# Patient Record
Sex: Female | Born: 1965 | Race: Black or African American | Hispanic: No | Marital: Single | Smoking: Current some day smoker
Health system: Southern US, Community
[De-identification: ages and names within clinical notes are randomized; demographics above are authoritative.]

## PROBLEM LIST (undated history)

## (undated) ENCOUNTER — Ambulatory Visit: Admission: EM | Payer: MEDICAID

## (undated) DIAGNOSIS — I219 Acute myocardial infarction, unspecified: Secondary | ICD-10-CM

## (undated) DIAGNOSIS — I2510 Atherosclerotic heart disease of native coronary artery without angina pectoris: Secondary | ICD-10-CM

## (undated) DIAGNOSIS — F319 Bipolar disorder, unspecified: Secondary | ICD-10-CM

## (undated) DIAGNOSIS — I639 Cerebral infarction, unspecified: Secondary | ICD-10-CM

## (undated) DIAGNOSIS — R569 Unspecified convulsions: Secondary | ICD-10-CM

## (undated) DIAGNOSIS — I10 Essential (primary) hypertension: Secondary | ICD-10-CM

## (undated) DIAGNOSIS — K5792 Diverticulitis of intestine, part unspecified, without perforation or abscess without bleeding: Secondary | ICD-10-CM

## (undated) DIAGNOSIS — J449 Chronic obstructive pulmonary disease, unspecified: Secondary | ICD-10-CM

## (undated) DIAGNOSIS — C801 Malignant (primary) neoplasm, unspecified: Secondary | ICD-10-CM

## (undated) HISTORY — PX: ABDOMINAL SURGERY: SHX537

## (undated) HISTORY — PX: BRAIN SURGERY: SHX531

## (undated) HISTORY — PX: CHOLECYSTECTOMY: SHX55

---

## 2019-09-10 DIAGNOSIS — Z955 Presence of coronary angioplasty implant and graft: Secondary | ICD-10-CM | POA: Insufficient documentation

## 2019-09-10 DIAGNOSIS — I10 Essential (primary) hypertension: Secondary | ICD-10-CM | POA: Insufficient documentation

## 2019-09-10 DIAGNOSIS — I252 Old myocardial infarction: Secondary | ICD-10-CM | POA: Insufficient documentation

## 2019-09-10 DIAGNOSIS — N183 Chronic kidney disease, stage 3 unspecified: Secondary | ICD-10-CM | POA: Insufficient documentation

## 2019-09-10 DIAGNOSIS — Z86718 Personal history of other venous thrombosis and embolism: Secondary | ICD-10-CM | POA: Insufficient documentation

## 2019-09-10 DIAGNOSIS — Z86711 Personal history of pulmonary embolism: Secondary | ICD-10-CM | POA: Insufficient documentation

## 2019-09-10 DIAGNOSIS — E785 Hyperlipidemia, unspecified: Secondary | ICD-10-CM | POA: Insufficient documentation

## 2020-04-04 ENCOUNTER — Emergency Department (HOSPITAL_COMMUNITY): Payer: Medicaid Other

## 2020-04-04 ENCOUNTER — Other Ambulatory Visit: Payer: Self-pay

## 2020-04-04 ENCOUNTER — Encounter (HOSPITAL_COMMUNITY): Payer: Self-pay | Admitting: Emergency Medicine

## 2020-04-04 ENCOUNTER — Inpatient Hospital Stay (HOSPITAL_COMMUNITY)
Admission: EM | Admit: 2020-04-04 | Discharge: 2020-04-05 | DRG: 311 | Discharge status: Against Medical Advice | Payer: Medicaid Other | Attending: Cardiovascular Disease | Admitting: Cardiovascular Disease

## 2020-04-04 DIAGNOSIS — I129 Hypertensive chronic kidney disease with stage 1 through stage 4 chronic kidney disease, or unspecified chronic kidney disease: Secondary | ICD-10-CM | POA: Diagnosis present

## 2020-04-04 DIAGNOSIS — Z5329 Procedure and treatment not carried out because of patient's decision for other reasons: Secondary | ICD-10-CM | POA: Diagnosis present

## 2020-04-04 DIAGNOSIS — I249 Acute ischemic heart disease, unspecified: Principal | ICD-10-CM | POA: Diagnosis present

## 2020-04-04 DIAGNOSIS — I340 Nonrheumatic mitral (valve) insufficiency: Secondary | ICD-10-CM | POA: Diagnosis present

## 2020-04-04 DIAGNOSIS — N1831 Chronic kidney disease, stage 3a: Secondary | ICD-10-CM | POA: Diagnosis present

## 2020-04-04 DIAGNOSIS — G40909 Epilepsy, unspecified, not intractable, without status epilepticus: Secondary | ICD-10-CM | POA: Diagnosis present

## 2020-04-04 DIAGNOSIS — Z20822 Contact with and (suspected) exposure to covid-19: Secondary | ICD-10-CM | POA: Diagnosis present

## 2020-04-04 DIAGNOSIS — R079 Chest pain, unspecified: Secondary | ICD-10-CM

## 2020-04-04 DIAGNOSIS — I200 Unstable angina: Secondary | ICD-10-CM

## 2020-04-04 DIAGNOSIS — J449 Chronic obstructive pulmonary disease, unspecified: Secondary | ICD-10-CM | POA: Diagnosis present

## 2020-04-04 DIAGNOSIS — I252 Old myocardial infarction: Secondary | ICD-10-CM | POA: Diagnosis not present

## 2020-04-04 DIAGNOSIS — Z8673 Personal history of transient ischemic attack (TIA), and cerebral infarction without residual deficits: Secondary | ICD-10-CM

## 2020-04-04 DIAGNOSIS — Z9111 Patient's noncompliance with dietary regimen: Secondary | ICD-10-CM | POA: Diagnosis not present

## 2020-04-04 DIAGNOSIS — Z955 Presence of coronary angioplasty implant and graft: Secondary | ICD-10-CM | POA: Diagnosis not present

## 2020-04-04 DIAGNOSIS — Z6841 Body Mass Index (BMI) 40.0 and over, adult: Secondary | ICD-10-CM | POA: Diagnosis not present

## 2020-04-04 HISTORY — DX: Cerebral infarction, unspecified: I63.9

## 2020-04-04 HISTORY — DX: Acute myocardial infarction, unspecified: I21.9

## 2020-04-04 HISTORY — DX: Diverticulitis of intestine, part unspecified, without perforation or abscess without bleeding: K57.92

## 2020-04-04 HISTORY — DX: Unspecified convulsions: R56.9

## 2020-04-04 HISTORY — DX: Chronic obstructive pulmonary disease, unspecified: J44.9

## 2020-04-04 HISTORY — DX: Essential (primary) hypertension: I10

## 2020-04-04 LAB — CBC
HCT: 40.8 % (ref 36.0–46.0)
Hemoglobin: 13.5 g/dL (ref 12.0–15.0)
MCH: 28.7 pg (ref 26.0–34.0)
MCHC: 33.1 g/dL (ref 30.0–36.0)
MCV: 86.6 fL (ref 80.0–100.0)
Platelets: 266 10*3/uL (ref 150–400)
RBC: 4.71 MIL/uL (ref 3.87–5.11)
RDW: 15.4 % (ref 11.5–15.5)
WBC: 10.6 10*3/uL — ABNORMAL HIGH (ref 4.0–10.5)
nRBC: 0 % (ref 0.0–0.2)

## 2020-04-04 LAB — BASIC METABOLIC PANEL
Anion gap: 12 (ref 5–15)
BUN: 13 mg/dL (ref 6–20)
CO2: 19 mmol/L — ABNORMAL LOW (ref 22–32)
Calcium: 9.1 mg/dL (ref 8.9–10.3)
Chloride: 109 mmol/L (ref 98–111)
Creatinine, Ser: 1.83 mg/dL — ABNORMAL HIGH (ref 0.44–1.00)
GFR, Estimated: 32 mL/min — ABNORMAL LOW (ref >60)
Glucose, Bld: 90 mg/dL (ref 70–99)
Potassium: 4.1 mmol/L (ref 3.5–5.1)
Sodium: 140 mmol/L (ref 135–145)

## 2020-04-04 LAB — TROPONIN I (HIGH SENSITIVITY): Troponin I (High Sensitivity): 21 ng/L — ABNORMAL HIGH (ref <18)

## 2020-04-04 IMAGING — DX DG CHEST 2V
2 series · 2 of 2 positions shown · non-contrast
Comparison: None.

CLINICAL DATA: Left-sided chest pain for 4 days, nausea, short of
breath

EXAM:
CHEST - 2 VIEW

[chest pa]
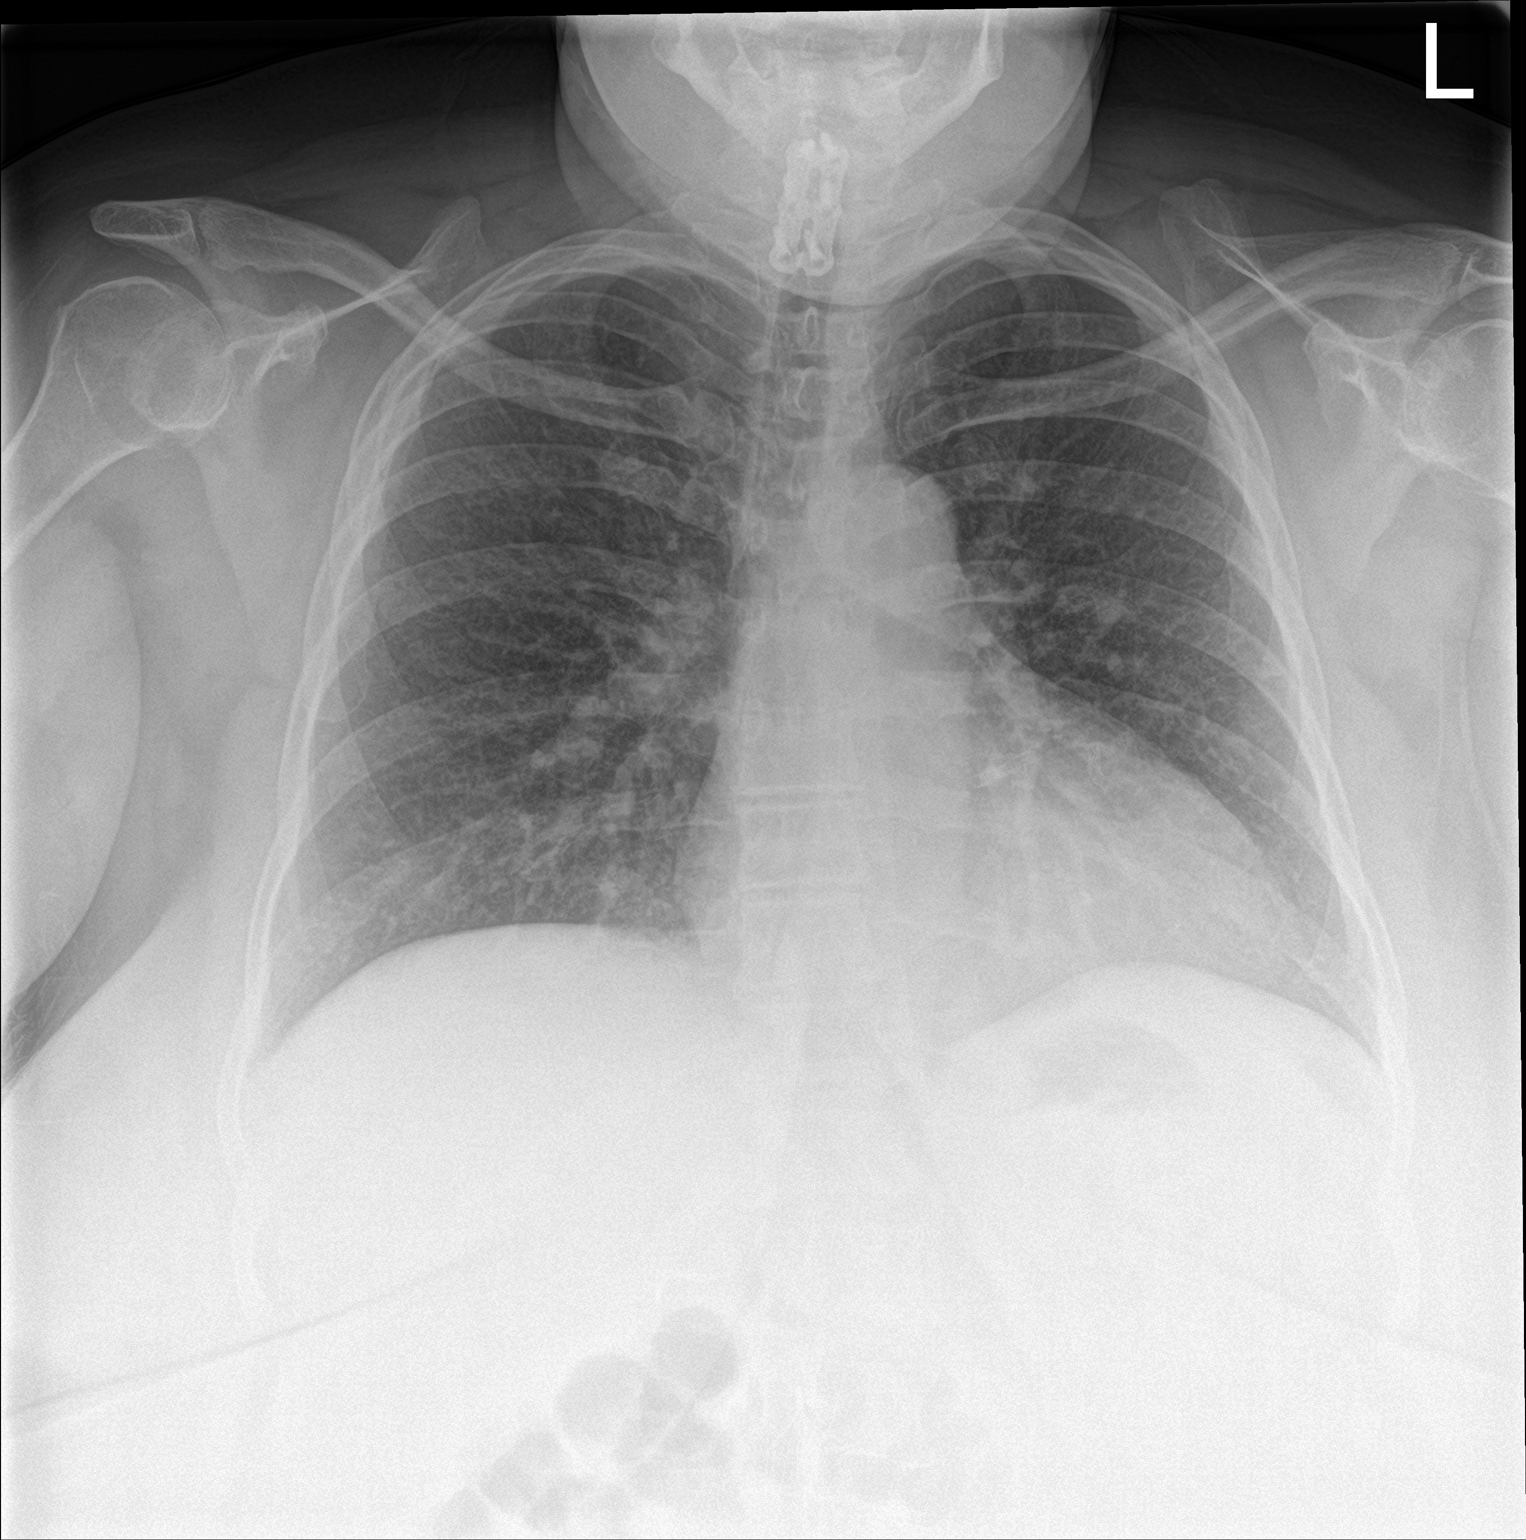

[chest lat]
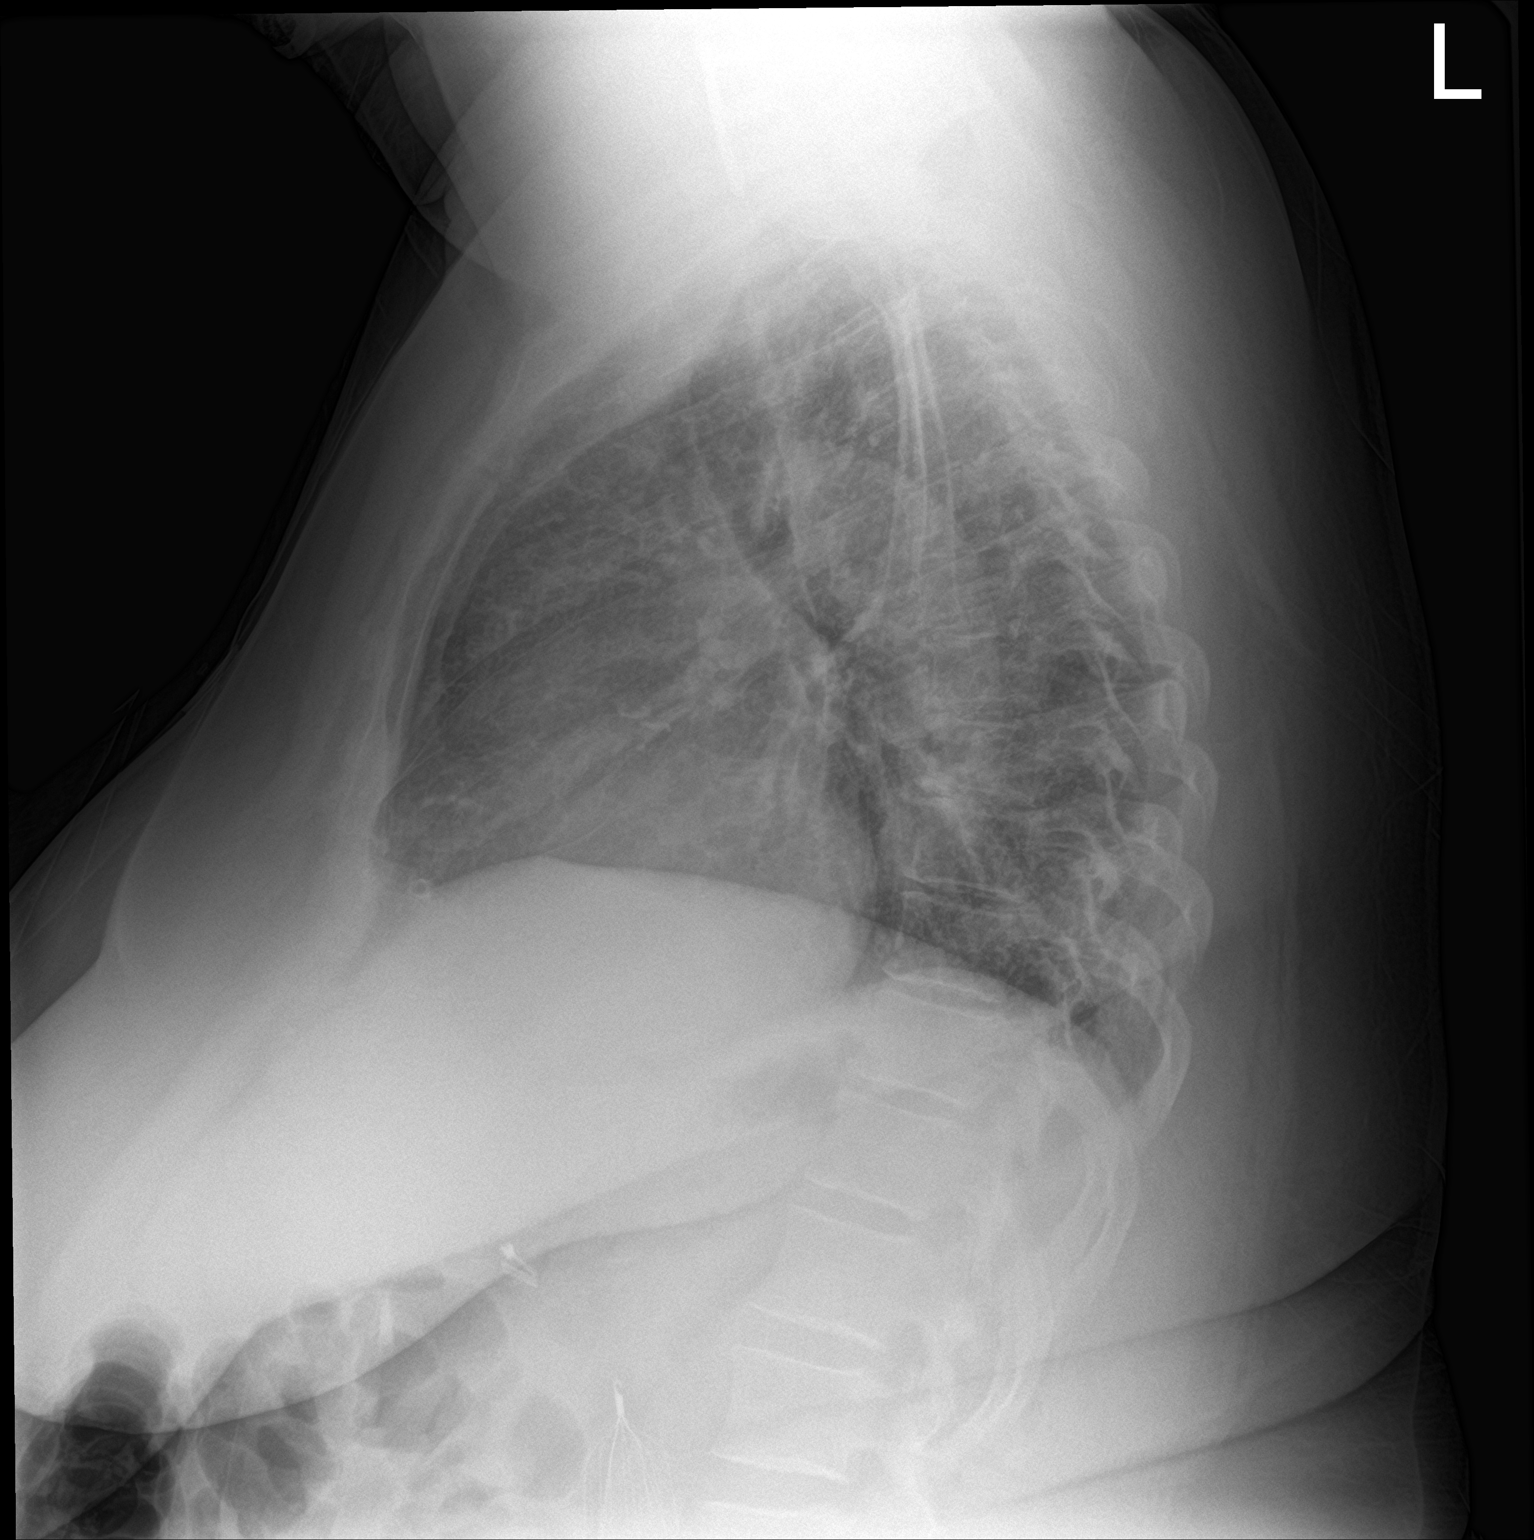

[2 of 2 positions shown; findings below may reference images not displayed]

FINDINGS: Frontal and lateral views of the chest demonstrate an unremarkable
cardiac silhouette. No acute airspace disease, effusion, or
pneumothorax. No acute bony abnormalities. IVC filter seen within
the abdomen.
IMPRESSION: 1. No acute intrathoracic process.

## 2020-04-04 MED ORDER — SODIUM CHLORIDE 0.9 % IV SOLN: INTRAVENOUS | Status: DC

## 2020-04-04 MED ORDER — ATORVASTATIN CALCIUM 80 MG PO TABS
80.0000 mg | ORAL_TABLET | Freq: Every day | ORAL | Status: DC
  Administered 2020-04-04 – 2020-04-05 (×2): 80 mg via ORAL
  Filled 2020-04-04: qty 1
  Filled 2020-04-04: qty 2

## 2020-04-04 MED ORDER — ONDANSETRON HCL 4 MG/2ML IJ SOLN: 4.0000 mg | Freq: Four times a day (QID) | INTRAMUSCULAR | Status: DC | PRN

## 2020-04-04 MED ORDER — HEPARIN (PORCINE) 25000 UT/250ML-% IV SOLN
1700.0000 [IU]/h | INTRAVENOUS | Status: DC
  Administered 2020-04-04: 20:00:00 1100 [IU]/h via INTRAVENOUS
  Filled 2020-04-04 (×4): qty 250

## 2020-04-04 MED ORDER — MORPHINE SULFATE (PF) 2 MG/ML IV SOLN
2.0000 mg | Freq: Once | INTRAVENOUS | Status: AC
  Administered 2020-04-04: 19:00:00 2 mg via INTRAVENOUS
  Filled 2020-04-04: qty 1

## 2020-04-04 MED ORDER — ACETAMINOPHEN 325 MG PO TABS
650.0000 mg | ORAL_TABLET | ORAL | Status: DC | PRN
  Administered 2020-04-04: 650 mg via ORAL
  Filled 2020-04-04: qty 2

## 2020-04-04 MED ORDER — HEPARIN BOLUS VIA INFUSION
4000.0000 [IU] | Freq: Once | INTRAVENOUS | Status: AC
  Administered 2020-04-04: 20:00:00 4000 [IU] via INTRAVENOUS
  Filled 2020-04-04: qty 4000

## 2020-04-04 MED ORDER — METOPROLOL TARTRATE 12.5 MG HALF TABLET
12.5000 mg | ORAL_TABLET | Freq: Two times a day (BID) | ORAL | Status: DC
  Administered 2020-04-04 – 2020-04-05 (×2): 12.5 mg via ORAL
  Filled 2020-04-04 (×2): qty 1

## 2020-04-04 MED ORDER — ASPIRIN EC 81 MG PO TBEC
81.0000 mg | DELAYED_RELEASE_TABLET | Freq: Every day | ORAL | Status: DC
  Administered 2020-04-05: 13:00:00 81 mg via ORAL
  Filled 2020-04-04: qty 1

## 2020-04-04 MED ORDER — NITROGLYCERIN 0.4 MG SL SUBL
0.4000 mg | SUBLINGUAL_TABLET | SUBLINGUAL | Status: DC | PRN
  Administered 2020-04-04: 20:00:00 0.4 mg via SUBLINGUAL
  Filled 2020-04-04: qty 1

## 2020-04-04 NOTE — H&P (Addendum)
Referring Physician: Belaya/Tegler  Colleen Curtis is an 54 y.o. female.                       Chief Complaint: Chest pain  HPI: 54 years old black female with prior h/o of MI and multiple stent placement in [place] has left sided under the breast chest pain radiating to left arm. Chest pain responded to SL NTG. She has EKG with sinus rhythm, low voltage and without ST elevation. Her chest x-ray is negative for acute problem. Labs are pending.  She denies fever or significant cough. She has PMH of HTN, COPD, MI, s/p stents and stroke.   Past Medical History:  Diagnosis Date   COPD (chronic obstructive pulmonary disease) (HCC)    Diverticulitis    Hypertension    Myocardial infarction (HCC)    Seizure (HCC)    Stroke [facility])       Past Surgical History:  Procedure Laterality Date   CHOLECYSTECTOMY      No family history on file. Social History:  has no history on file for tobacco use, alcohol use, and drug use.  Allergies: No Known Allergies  (Not in a hospital admission)   No results found for this or any previous visit (from the past 48 hour(s)). DG Chest 2 View  Result Date: 04/04/2020 CLINICAL DATA:  Left-sided chest pain for 4 days, nausea, short of breath EXAM: CHEST - 2 VIEW COMPARISON:  None. FINDINGS: Frontal and lateral views of the chest demonstrate an unremarkable cardiac silhouette. No acute airspace disease, effusion, or pneumothorax. No acute bony abnormalities. IVC filter seen within the abdomen. IMPRESSION: 1. No acute intrathoracic process. Electronically Signed   By: Sharlet Salina M.D.   On: 04/04/2020 18:04    Review Of Systems Constitutional: No fever, chills, chronic weight gain. Eyes: No vision change, wears glasses. No discharge or pain. Ears: No hearing loss, No tinnitus. Respiratory: No asthma, positive COPD, pneumonias. Positive shortness of breath. No hemoptysis. Cardiovascular: Positive chest pain, palpitation, leg  edema. Gastrointestinal: No nausea, vomiting, diarrhea, constipation. No GI bleed. No hepatitis. Genitourinary: No dysuria, hematuria, kidney stone. No incontinance. Neurological: No headache, positive stroke, no seizures.  Psychiatry: No psych facility admission for anxiety, depression, suicide. No detox. Skin: No rash. Musculoskeletal: Positive joint pain, fibromyalgia, neck pain, back pain. Lymphadenopathy: No lymphadenopathy. Hematology: Positive anemia or easy bruising.   Blood pressure 140/89, pulse 75, temperature 98.5 F (36.9 C), temperature source Oral, resp. rate 20, height 5\' 3"  (1.6 m), weight 127 kg, SpO2 100 %. Body mass index is 49.6 kg/m. General appearance: alert, cooperative, appears stated age and no distress Head: Normocephalic, atraumatic. Eyes: Brown eyes, pink conjunctiva, corneas clear.  Neck: No adenopathy, no carotid bruit, no JVD, supple, symmetrical, trachea midline and thyroid not enlarged. Resp: Clear to auscultation bilaterally. Cardio: Regular rate and rhythm, S1, S2 normal, II/VI systolic murmur, no click, rub or gallop GI: Soft, non-tender; bowel sounds normal; no organomegaly. Extremities: No edema, cyanosis or clubbing. Skin: Warm and dry.  Neurologic: Alert and oriented X 3, normal strength. Normal coordination.  Assessment/Plan Acute coronary syndrome S/P coronary stents HTN COPD Morbid obesity  Admit Check Troponin I, IV heparin  Cardiac cath v/s nuclear stress test in AM  Time spent: Review of old records, Lab, x-rays, EKG, other cardiac tests, examination, discussion with patient/Nurse/Physician over 70 minutes.  , MD  04/04/2020, 6:43 PM

## 2020-04-04 NOTE — Progress Notes (Signed)
ANTICOAGULATION CONSULT NOTE  Pharmacy Consult for Heparin Indication: chest pain/ACS  No Known Allergies  Patient Measurements: Height: 5\' 3"  (160 cm) Weight: 127 kg (280 lb) IBW/kg (Calculated) : 52.4 Heparin Dosing Weight: 84 kg  Vital Signs: Temp: 98.5 F (36.9 C) (12/28 1723) Temp Source: Oral (12/28 1723) BP: 140/89 (12/28 1723) Pulse Rate: 75 (12/28 1723)  Labs: No results for input(s): HGB, HCT, PLT, APTT, LABPROT, INR, HEPARINUNFRC, HEPRLOWMOCWT, CREATININE, CKTOTAL, CKMB, TROPONINIHS in the last 72 hours.  CrCl cannot be calculated (No successful lab value found.).   Medical History: Past Medical History:  Diagnosis Date  . COPD (chronic obstructive pulmonary disease) (HCC)   . Diverticulitis   . Hypertension   . Myocardial infarction (HCC)   . Seizure (HCC)   . Stroke [facility])     Medications:  Scheduled:  . [START ON 04/05/2020] aspirin EC  81 mg Oral Daily  . atorvastatin  80 mg Oral Daily  . heparin  4,000 Units Intravenous Once  . metoprolol tartrate  12.5 mg Oral BID  .  morphine injection  2 mg Intravenous Once    Assessment: Patient is a 61 yof that is being admitted for an ACS workup. The patient has a hx of Mis in the past with stent. The patient presents today with chest pain and cardiology has asked pharmacy to dose heparin at this time. Trops still pending. Patient not taking her xarelto 2/2 to insurance not covering.   Goal of Therapy:  Heparin level 0.3-0.7 units/ml Monitor platelets by anticoagulation protocol: Yes   Plan:  - Heparin bolus 4000 units IV x 1 dose - Heparin drip @ 1100 units/hr - Heparin level in ~ 6 hours  - Monitor patient for s/s of bleeding and CBC while on heparin   59 PharmD. BCPS  04/04/2020,6:55 PM

## 2020-04-04 NOTE — ED Notes (Signed)
Pt given a bag lunch and gingerale. 

## 2020-04-04 NOTE — ED Triage Notes (Signed)
Pt with L side pain with radiation to L chest x 4 days. Endorses nausea and shortness of breath. Took a nitro at 1200 today with some improvement in pain. Hx MI with stent placement.

## 2020-04-04 NOTE — ED Provider Notes (Addendum)
[facility] EMERGENCY DEPARTMENT Provider Note   CSN: [id number] Arrival date & time: 04/04/20  1716     History No chief complaint on file.   Colleen Curtis is a 54 y.o. female.  HPI 54 year old female with history of COPD, hypertension, MI, seizures, stroke [place] to the ER with 4 days of left-sided chest pain that radiates into her left arm.  Patient states she had an MI 2018 with 5 stent placements.  States that she has tried several blood thinners, the only blood thinner that she has not had a reaction to Xarelto, however she is not taking it as her insurance will not cover it.  She noticed left-sided chest pain proximally 4 days ago which has progressively gotten worse.  Pain then progressed to the left arm proximally a day ago.  She states that when she had her previous MI, she just had left arm pain, no chest pain.  She is also having worsening shortness of breath with exertion, which is more than her baseline with her COPD.  She also endorses intermittent nausea.  She took some nitro at around 12:00 today with some mild relief.  States that she just recently moved to the [place] area from [place] and does not have a cardiologist.  Continues to be symptomatic.  She is having symptoms at rest.  No fevers or chills.  No cough or leg swelling.  No back pain.  No diaphoresis.    Past Medical History:  Diagnosis Date  . COPD (chronic obstructive pulmonary disease) ([place])   . Diverticulitis   . Hypertension   . Myocardial infarction ([place])   . Seizure ([place])   . Stroke [facility])     Patient Active Problem List   Diagnosis Date Noted  . Acute coronary syndrome ([place]) 04/04/2020    Past Surgical History:  Procedure Laterality Date  . ABDOMINAL SURGERY    . CHOLECYSTECTOMY       OB History   No obstetric history on file.     History reviewed. No pertinent family history.  Social History   Tobacco Use  . Smoking status: Current Some Day Smoker     Types: Cigars  . Smokeless tobacco: Never Used  Vaping Use  . Vaping Use: Never used  Substance Use Topics  . Alcohol use: Never  . Drug use: Never    Comment: " medical Marijuana "    Home Medications Prior to Admission medications   Medication Sig Start Date End Date Taking? Authorizing Provider  albuterol (VENTOLIN HFA) 108 (90 Base) MCG/ACT inhaler Inhale 1 puff into the lungs daily as needed for wheezing or shortness of breath.   Yes [provider]  allopurinol (ZYLOPRIM) 100 MG tablet Take 100 mg by mouth daily.   Yes [provider]  atorvastatin (LIPITOR) 40 MG tablet Take 80 mg by mouth at bedtime. 09/17/19  Yes [provider]  carvedilol (COREG) 12.5 MG tablet Take by mouth 2 (two) times daily with a meal.   Yes [provider]  isosorbide mononitrate (IMDUR) 60 MG 24 hr tablet Take 60 mg by mouth daily.   Yes [provider]  nitroGLYCERIN (NITROSTAT) 0.4 MG SL tablet Place 0.4 mg under the tongue every 5 (five) minutes as needed for chest pain.   Yes [provider]  ranolazine (RANEXA) 1000 MG SR tablet Take by mouth in the morning and at bedtime. 09/17/19  Yes [provider]  zolpidem (AMBIEN) 10 MG  tablet Take 10 mg by mouth at bedtime.   Yes [provider]    Allergies    Patient has no known allergies.  Review of Systems   Review of Systems  Constitutional: Negative for chills and fever.  HENT: Negative for ear pain and sore throat.   Eyes: Negative for pain and visual disturbance.  Respiratory: Positive for chest tightness and shortness of breath. Negative for cough and wheezing.   Cardiovascular: Positive for chest pain. Negative for palpitations and leg swelling.  Gastrointestinal: Negative for abdominal pain and vomiting.  Genitourinary: Negative for dysuria and hematuria.  Musculoskeletal: Negative for arthralgias and back pain.  Skin: Negative for color change and rash.  Neurological:  Negative for seizures and syncope.  All other systems reviewed and are negative.   Physical Exam Updated Vital Signs BP (!) 151/90 (BP Location: Right Arm)   Pulse 60   Temp 98.1 F (36.7 C) (Oral)   Resp 20   Ht 5\' 3"  (1.6 m)   Wt 127.7 kg   SpO2 100%   BMI 49.87 kg/m   Physical Exam Vitals and nursing note reviewed.  Constitutional:      General: She is not in acute distress.    Appearance: She is well-developed and well-nourished. She is obese. She is not ill-appearing or diaphoretic.  HENT:     Head: Normocephalic and atraumatic.  Eyes:     Conjunctiva/sclera: Conjunctivae normal.  Cardiovascular:     Rate and Rhythm: Normal rate and regular rhythm.     Pulses: Normal pulses.     Heart sounds: No murmur heard.   Pulmonary:     Effort: No respiratory distress.     Breath sounds: Normal breath sounds.     Comments: Mildly tachypneic with speaking Abdominal:     General: Abdomen is flat.     Palpations: Abdomen is soft.     Tenderness: There is no abdominal tenderness.  Musculoskeletal:        General: No edema. Normal range of motion.     Cervical back: Neck supple.     Right lower leg: No edema.     Left lower leg: No edema.  Skin:    General: Skin is warm and dry.     Capillary Refill: Capillary refill takes less than 2 seconds.     Findings: No erythema or rash.  Neurological:     General: No focal deficit present.     Mental Status: She is alert and oriented to person, place, and time.  Psychiatric:        Mood and Affect: Mood and affect normal.     ED Results / Procedures / Treatments   Labs (all labs ordered are listed, but only abnormal results are displayed) Labs Reviewed  BASIC METABOLIC PANEL - Abnormal; Notable for the following components:      Result Value   CO2 19 (*)    Creatinine, Ser 1.83 (*)    GFR, Estimated 32 (*)    All other components within normal limits  CBC - Abnormal; Notable for the following components:   WBC 10.6 (*)     All other components within normal limits  BASIC METABOLIC PANEL - Abnormal; Notable for the following components:   CO2 20 (*)    Glucose, Bld 100 (*)    Creatinine, Ser 1.88 (*)    Calcium 8.6 (*)    GFR, Estimated 31 (*)    All other components within normal limits  LIPID  PANEL - Abnormal; Notable for the following components:   Cholesterol 218 (*)    Triglycerides 198 (*)    HDL 40 (*)    LDL Cholesterol 138 (*)    All other components within normal limits  HEPARIN LEVEL (UNFRACTIONATED) - Abnormal; Notable for the following components:   Heparin Unfractionated 0.13 (*)    All other components within normal limits  HEPARIN LEVEL (UNFRACTIONATED) - Abnormal; Notable for the following components:   Heparin Unfractionated <0.10 (*)    All other components within normal limits  TROPONIN I (HIGH SENSITIVITY) - Abnormal; Notable for the following components:   Troponin I (High Sensitivity) 21 (*)    All other components within normal limits  TROPONIN I (HIGH SENSITIVITY) - Abnormal; Notable for the following components:   Troponin I (High Sensitivity) 26 (*)    All other components within normal limits  SARS CORONAVIRUS 2 (TAT 6-24 HRS)  HIV ANTIBODY (ROUTINE TESTING W REFLEX)  PROTIME-INR  CBC    EKG EKG Interpretation  Date/Time:  Wednesday April 05 2020 08:03:38 EST Ventricular Rate:  59 PR Interval:  182 QRS Duration: 101 QT Interval:  451 QTC Calculation: 447 R Axis:   75 Text Interpretation: Sinus rhythm Low voltage, precordial leads Confirmed by Kennis Carina [id number]) on 04/06/2020 12:52:17 PM   Radiology No results found.  Procedures .Critical Care Performed by: Mare Ferrari, PA-C Authorized by: Mare Ferrari, PA-C   Critical care provider statement:    Critical care time (minutes):  35   Critical care was time spent personally by me on the following activities:  Discussions with consultants, evaluation of patient's response to treatment,  examination of patient, ordering and performing treatments and interventions, ordering and review of laboratory studies, ordering and review of radiographic studies, pulse oximetry, re-evaluation of patient's condition, obtaining history from patient or surrogate and review of old charts   (including critical care time)  Medications Ordered in ED Medications  morphine 2 MG/ML injection 2 mg (2 mg Intravenous Given 04/04/20 1903)  heparin bolus via infusion 4,000 Units (4,000 Units Intravenous Bolus from Bag 04/04/20 1951)  heparin bolus via infusion 2,000 Units (2,000 Units Intravenous Bolus from Bag 04/05/20 0458)  regadenoson (LEXISCAN) injection SOLN 0.4 mg (0.4 mg Intravenous Given 04/05/20 0922)  technetium tetrofosmin (TC-MYOVIEW) injection 29 millicurie (29 millicuries Intravenous Contrast Given 04/05/20 0930)  heparin bolus via infusion 2,000 Units (2,000 Units Intravenous Bolus from Bag 04/05/20 1503)    ED Course  I have reviewed the triage vital signs and the nursing notes.  Pertinent labs & imaging results that were available during my care of the patient were reviewed by me and considered in my medical decision making (see chart for details).    MDM Rules/Calculators/A&P                          54 year old female with left-sided chest pain and arm pain x4 days Presentation, she is alert, oriented, nontoxic-appearing, mildly tachypneic on exam, however speaking in full sentences, nondiaphoretic.  Vitals on arrival overall reassuring, not tachycardic, hypoxic, normotensive.  Physical exam with no noted lower extremity edema, clear lung sounds, normal heart sounds.  Given concerning story with patient's prior cardiac history, along with patient needing to be anticoagulated but is not on anticoagulation, consulted cardiology Dr. Algie Coffer who will see and evaluate the patient.  Cardiology will admit, started on heparin.  Remains hemodynamically stable here in the ED.  This was  a  shared visit with my supervising physician Dr. Sherry Ruffing who independently saw and evaluated the patient & provided guidance in evaluation/management/disposition ,in agreement with care  Final Clinical Impression(s) / ED Diagnoses Final diagnoses:  Unstable angina [facility])    Rx / DC Orders ED Discharge Orders    None       Lyndel Safe 04/04/20 1922    Tegeler, Gwenyth Allegra, MD 04/04/20 2200    Garald Balding, PA-C 04/17/20 1516    Tegeler, Gwenyth Allegra, MD 04/17/20 2241

## 2020-04-05 ENCOUNTER — Inpatient Hospital Stay (HOSPITAL_COMMUNITY): Payer: Medicaid Other

## 2020-04-05 LAB — HEPARIN LEVEL (UNFRACTIONATED)
Heparin Unfractionated: 0.13 IU/mL — ABNORMAL LOW (ref 0.30–0.70)
Heparin Unfractionated: <0.1 IU/mL — ABNORMAL LOW (ref 0.30–0.70)

## 2020-04-05 LAB — BASIC METABOLIC PANEL
Anion gap: 11 (ref 5–15)
BUN: 13 mg/dL (ref 6–20)
CO2: 20 mmol/L — ABNORMAL LOW (ref 22–32)
Calcium: 8.6 mg/dL — ABNORMAL LOW (ref 8.9–10.3)
Chloride: 108 mmol/L (ref 98–111)
Creatinine, Ser: 1.88 mg/dL — ABNORMAL HIGH (ref 0.44–1.00)
GFR, Estimated: 31 mL/min — ABNORMAL LOW (ref >60)
Glucose, Bld: 100 mg/dL — ABNORMAL HIGH (ref 70–99)
Potassium: 4 mmol/L (ref 3.5–5.1)
Sodium: 139 mmol/L (ref 135–145)

## 2020-04-05 LAB — CBC
HCT: 38.7 % (ref 36.0–46.0)
Hemoglobin: 13 g/dL (ref 12.0–15.0)
MCH: 28.3 pg (ref 26.0–34.0)
MCHC: 33.6 g/dL (ref 30.0–36.0)
MCV: 84.3 fL (ref 80.0–100.0)
Platelets: 258 10*3/uL (ref 150–400)
RBC: 4.59 MIL/uL (ref 3.87–5.11)
RDW: 15.4 % (ref 11.5–15.5)
WBC: 9.1 10*3/uL (ref 4.0–10.5)
nRBC: 0 % (ref 0.0–0.2)

## 2020-04-05 LAB — PROTIME-INR
INR: 1.1 (ref 0.8–1.2)
Prothrombin Time: 13.5 seconds (ref 11.4–15.2)

## 2020-04-05 LAB — HIV ANTIBODY (ROUTINE TESTING W REFLEX): HIV Screen 4th Generation wRfx: NONREACTIVE

## 2020-04-05 LAB — LIPID PANEL
Cholesterol: 218 mg/dL — ABNORMAL HIGH (ref 0–200)
HDL: 40 mg/dL — ABNORMAL LOW (ref >40)
LDL Cholesterol: 138 mg/dL — ABNORMAL HIGH (ref 0–99)
Total CHOL/HDL Ratio: 5.5 RATIO
Triglycerides: 198 mg/dL — ABNORMAL HIGH (ref <150)
VLDL: 40 mg/dL (ref 0–40)

## 2020-04-05 LAB — SARS CORONAVIRUS 2 (TAT 6-24 HRS): SARS Coronavirus 2: NEGATIVE

## 2020-04-05 LAB — TROPONIN I (HIGH SENSITIVITY): Troponin I (High Sensitivity): 26 ng/L — ABNORMAL HIGH (ref <18)

## 2020-04-05 MED ORDER — TECHNETIUM TC 99M TETROFOSMIN IV KIT
29.0000 | PACK | Freq: Once | INTRAVENOUS | Status: AC | PRN
  Administered 2020-04-05: 10:00:00 29 via INTRAVENOUS

## 2020-04-05 MED ORDER — HEPARIN BOLUS VIA INFUSION
2000.0000 [IU] | Freq: Once | INTRAVENOUS | Status: AC
  Administered 2020-04-05: 05:00:00 2000 [IU] via INTRAVENOUS
  Filled 2020-04-05: qty 2000

## 2020-04-05 MED ORDER — REGADENOSON 0.4 MG/5ML IV SOLN
0.4000 mg | Freq: Once | INTRAVENOUS | Status: AC
  Administered 2020-04-05: 09:00:00 0.4 mg via INTRAVENOUS
  Filled 2020-04-05: qty 5

## 2020-04-05 MED ORDER — HEPARIN BOLUS VIA INFUSION
2000.0000 [IU] | Freq: Once | INTRAVENOUS | Status: AC
  Administered 2020-04-05: 15:00:00 2000 [IU] via INTRAVENOUS
  Filled 2020-04-05: qty 2000

## 2020-04-05 MED ORDER — REGADENOSON 0.4 MG/5ML IV SOLN
INTRAVENOUS | Status: AC
  Filled 2020-04-05: qty 5

## 2020-04-05 MED ORDER — ALBUTEROL SULFATE (2.5 MG/3ML) 0.083% IN NEBU
2.5000 mg | INHALATION_SOLUTION | Freq: Two times a day (BID) | RESPIRATORY_TRACT | Status: DC
  Administered 2020-04-05: 08:00:00 2.5 mg via RESPIRATORY_TRACT
  Filled 2020-04-05: qty 3

## 2020-04-05 NOTE — ED Notes (Signed)
Cardiac ECHO being done at bedside

## 2020-04-05 NOTE — Progress Notes (Signed)
ANTICOAGULATION CONSULT NOTE  Pharmacy Consult for Heparin Indication: chest pain/ACS  Labs: Recent Labs    04/04/20 1845 04/05/20 0242 04/05/20 0243 04/05/20 1250  HGB 13.5  --  13.0  --   HCT 40.8  --  38.7  --   PLT 266  --  258  --   LABPROT  --  13.5  --   --   INR  --  1.1  --   --   HEPARINUNFRC  --  0.13*  --  <0.10*  CREATININE 1.83* 1.88*  --   --   TROPONINIHS 21* 26*  --   --    Assessment: Patient is a 29 yof that is being admitted for an ACS workup. The patient has a hx of Mis in the past with stent. The patient presents today with chest pain and cardiology has asked pharmacy to dose heparin at this time. Patient not taking her xarelto 2/2 to insurance not covering.  Initial heparin level 0.13 units/ml and follow up level undetectable on 1400 units/hr.   Goal of Therapy:  Heparin level 0.3-0.7 units/ml Monitor platelets by anticoagulation protocol: Yes   Plan:  - Heparin bolus 2000 units IV x 1 dose - Heparin drip @ 1700 units/hr - Heparin level in ~ 6 hours  - Monitor patient for s/s of bleeding and CBC while on heparin  Thanks for allowing pharmacy to be a part of this patient's care.  Sheppard Coil PharmD., BCPS Clinical Pharmacist 04/05/2020 2:37 PM

## 2020-04-05 NOTE — Progress Notes (Signed)
  Echocardiogram 2D Echocardiogram has been performed.  Colleen Curtis 04/05/2020, 11:58 AM

## 2020-04-05 NOTE — ED Notes (Signed)
Pt to bedside commode with assistance

## 2020-04-05 NOTE — Progress Notes (Signed)
MD on call made aware that patient is leaving AMA.

## 2020-04-05 NOTE — ED Notes (Addendum)
Patient noted to brady down into the 30s/40s for a very brief period of time (<6 seconds) when coughing is heard from room, then quickly recovers back to baseline of sinus brady in 50s or normal sinus in 60s with rare PVCs. Patient asymptomatic during these times.

## 2020-04-05 NOTE — Plan of Care (Signed)
  Problem: Education: Goal: Knowledge of General Education information will improve Description Including pain rating scale, medication(s)/side effects and non-pharmacologic comfort measures Outcome: Progressing   

## 2020-04-05 NOTE — Progress Notes (Signed)
ANTICOAGULATION CONSULT NOTE  Pharmacy Consult for Heparin Indication: chest pain/ACS  Labs: Recent Labs    04/04/20 1845 04/05/20 0242 04/05/20 0243  HGB 13.5  --  13.0  HCT 40.8  --  38.7  PLT 266  --  258  LABPROT  --  13.5  --   INR  --  1.1  --   HEPARINUNFRC  --  0.13*  --   CREATININE 1.83* 1.88*  --   TROPONINIHS 21* 26*  --    Assessment: Patient is a 71 yof that is being admitted for an ACS workup. The patient has a hx of Mis in the past with stent. The patient presents today with chest pain and cardiology has asked pharmacy to dose heparin at this time. Patient not taking her xarelto 2/2 to insurance not covering.  Initial heparin level 0.13 units/ml  Goal of Therapy:  Heparin level 0.3-0.7 units/ml Monitor platelets by anticoagulation protocol: Yes   Plan:  - Heparin bolus 2000 units IV x 1 dose - Heparin drip @ 1400 units/hr - Heparin level in ~ 6 hours  - Monitor patient for s/s of bleeding and CBC while on heparin  Thanks for allowing pharmacy to be a part of this patient's care.  Talbert Cage, PharmD Clinical Pharmacist 04/05/2020,4:52 AM

## 2020-04-05 NOTE — Progress Notes (Signed)
Ref: Pcp, No   Subjective:  Awake. Some shortness of breath. Troponin I is minimally elevated. Stents were placed about 1 year ago per patient. VS stable.  Objective:  Vital Signs in the last 24 hours: Temp:  [98.5 F (36.9 C)] 98.5 F (36.9 C) (12/28 1723) Pulse Rate:  [56-75] 56 (12/29 0630) Cardiac Rhythm: Sinus bradycardia (12/29 0551) Resp:  [12-21] 12 (12/29 0630) BP: (127-159)/(68-105) 150/95 (12/29 0630) SpO2:  [93 %-100 %] 100 % (12/29 0630) Weight:  [132 kg] 127 kg (12/28 1723)  Physical Exam: BP Readings from Last 1 Encounters:  04/05/20 (!) 150/95     Wt Readings from Last 1 Encounters:  04/04/20 127 kg    Weight change:  Body mass index is 49.6 kg/m. HEENT: [place]/AT, Eyes-Brown, Conjunctiva-Pink, Sclera-Non-icteric Neck: No JVD, No bruit, Trachea midline. Lungs:  Clear, Bilateral. Cardiac:  Regular rhythm, normal S1 and S2, no S3. II/VI systolic murmur. Abdomen:  Soft, non-tender. BS present. Extremities:  No edema present. No cyanosis. No clubbing. CNS: AxOx3, Cranial nerves grossly intact, moves all 4 extremities.  Skin: Warm and dry.   Intake/Output from previous day: 12/28 0701 - 12/29 0700 In: 458.7 [I.V.:458.7] Out: -     Lab Results: BMET    Component Value Date/Time   NA 139 04/05/2020 0242   NA 140 04/04/2020 1845   K 4.0 04/05/2020 0242   K 4.1 04/04/2020 1845   CL 108 04/05/2020 0242   CL 109 04/04/2020 1845   CO2 20 (L) 04/05/2020 0242   CO2 19 (L) 04/04/2020 1845   GLUCOSE 100 (H) 04/05/2020 0242   GLUCOSE 90 04/04/2020 1845   BUN 13 04/05/2020 0242   BUN 13 04/04/2020 1845   CREATININE 1.88 (H) 04/05/2020 0242   CREATININE 1.83 (H) 04/04/2020 1845   CALCIUM 8.6 (L) 04/05/2020 0242   CALCIUM 9.1 04/04/2020 1845   GFRNONAA 31 (L) 04/05/2020 0242   GFRNONAA 32 (L) 04/04/2020 1845   CBC    Component Value Date/Time   WBC 9.1 04/05/2020 0243   RBC 4.59 04/05/2020 0243   HGB 13.0 04/05/2020 0243   HCT 38.7 04/05/2020 0243    PLT 258 04/05/2020 0243   MCV 84.3 04/05/2020 0243   MCH 28.3 04/05/2020 0243   MCHC 33.6 04/05/2020 0243   RDW 15.4 04/05/2020 0243   HEPATIC Function Panel No results for input(s): PROT in the last 8760 hours.  Invalid input(s):  ALBUMIN,  AST,  ALT,  ALKPHOS,  BILIDIR,  IBILI HEMOGLOBIN A1C No components found for: HGA1C,  MPG CARDIAC ENZYMES No results found for: CKTOTAL, CKMB, CKMBINDEX, TROPONINI BNP No results for input(s): PROBNP in the last 8760 hours. TSH No results for input(s): TSH in the last 8760 hours. CHOLESTEROL Recent Labs    04/05/20 0243  CHOL 218*    Scheduled Meds: . albuterol  2.5 mg Nebulization BID  . aspirin EC  81 mg Oral Daily  . atorvastatin  80 mg Oral Daily  . metoprolol tartrate  12.5 mg Oral BID   Continuous Infusions: . sodium chloride 50 mL/hr at 04/05/20 0501  . heparin 1,400 Units/hr (04/05/20 0458)   PRN Meds:.acetaminophen, nitroGLYCERIN, ondansetron (ZOFRAN) IV  Assessment/Plan: Acute coronary syndrome S/P coronary stents HTN COPD Morbid obesity  Patient prefers NM myocardial perfusion test over cardiac catheterization.   LOS: 1 day   Time spent including chart review, lab review, examination, discussion with patient/nurse : 30 min   Orpah Cobb  MD  04/05/2020, 8:02 AM

## 2020-04-05 NOTE — ED Notes (Signed)
Pt returned to ED from stress test

## 2020-04-05 NOTE — ED Notes (Addendum)
Patient up and to [facility] to void with minimal assistance. Tolerated well. Provider messaged due to patient requesting albuterol breathing treatments, which she uses twice/day at home for her COPD.

## 2020-04-05 NOTE — ED Notes (Signed)
Attempted to call report nurse is not available at this time

## 2020-04-05 NOTE — ED Notes (Signed)
Pt transported to cardiac stress test

## 2020-04-06 ENCOUNTER — Emergency Department (HOSPITAL_COMMUNITY)
Admit: 2020-04-06 | Discharge: 2020-04-06 | Disposition: A | Discharge status: Home / Self Care | Payer: Medicaid Other | Attending: Cardiovascular Disease | Admitting: Cardiovascular Disease

## 2020-04-06 ENCOUNTER — Other Ambulatory Visit: Payer: Self-pay

## 2020-04-06 LAB — ECHOCARDIOGRAM COMPLETE
Area-P 1/2: 2.37 cm2
Height: 63 in
Radius: 0.4 cm
S' Lateral: 3.6 cm
Weight: 4480 oz

## 2020-04-06 NOTE — Discharge Summary (Signed)
Physician Discharge Summary  Patient ID: Colleen Curtis MRN: [id number] DOB/AGE: 56-Aug-1967 56 y.o.  Admit date: 04/04/2020 Discharge date: 04/06/2020  Admission Diagnoses: Acute coronary syndrome S/P coronary stents Hypertension COPD Morbid obesity  Discharge Diagnoses:  Principle Problem: * Acute coronary syndrome (HCC) * Active Problems: S/P coronary stents Moderate to severe Mitral valve regurgitation Hypertension Hyperlipidemia CKD, IIIa COPD Morbid obesity Dietary non-compliance  Discharged Condition: fair  Hospital Course: 54 years old black female with PMH of hypertension, hyperlipidemia, COPD, MI, S/P stents, morbid obesity and stroke had left sided under the breast chest pain radiating to left arm. Her EKG, Chest x-ray and HS-troponin I levels were unremarkable.  She underwent 2 day protocol for NM myocardial perfusion stress test and finished day 1 stress part of the test. Her lipid panel showed elevated cholesterol levels. She was advised to resume dietary compliance and activity. She signed out AMA for personal reasons.  Consults: cardiology  Significant Diagnostic Studies: labs: CBC is near normal. BMET showed elevated creatinine level of 1.83 mg.  Cholesterol was 218 mg. Total, 138 mg. LDL and 198 mg. Triglycerides. Troponin I levels were minimally elevated.  EKG: NSR, low voltage  Chest x-ray: Unremarkable.  Echocardiogram: Preserved LV systolic function, inferior wall hypokinesia with moderate to severe MR  Treatments: cardiac meds: Atorvastatin, Isosorbide, ranolazine, carvedilol and SL NTG  Discharge Exam: Blood pressure (!) 151/90, pulse 60, temperature 98.1 F (36.7 C), temperature source Oral, resp. rate 20, height 5\' 3"  (1.6 m), weight 127.7 kg, SpO2 100 %. General appearance: alert, cooperative and appears stated age. Head: Normocephalic, atraumatic. Eyes: Brown eyes, pink conjunctiva, corneas clear. PERRL, EOM's intact.  Neck: No  adenopathy, no carotid bruit, no JVD, supple, symmetrical, trachea midline and thyroid not enlarged. Resp: Clear to auscultation bilaterally. Cardio: Regular rate and rhythm, S1, S2 normal, III/VI systolic murmur, no click, rub or gallop. GI: Soft, non-tender; bowel sounds normal; no organomegaly. Extremities: No edema, cyanosis or clubbing. Skin: Warm and dry.  Neurologic: Alert and oriented X 3, normal strength and tone. Normal coordination and gait.  Disposition:    Allergies as of 04/05/2020   No Known Allergies     Medication List    STOP taking these medications   alprazolam 2 MG tablet Commonly known as: XANAX     TAKE these medications   albuterol 108 (90 Base) MCG/ACT inhaler Commonly known as: VENTOLIN HFA Inhale 1 puff into the lungs daily as needed for wheezing or shortness of breath.   allopurinol 100 MG tablet Commonly known as: ZYLOPRIM Take 100 mg by mouth daily.   atorvastatin 40 MG tablet Commonly known as: LIPITOR Take 80 mg by mouth at bedtime.   carvedilol 12.5 MG tablet Commonly known as: COREG Take by mouth 2 (two) times daily with a meal.   isosorbide mononitrate 60 MG 24 hr tablet Commonly known as: IMDUR Take 60 mg by mouth daily.   nitroGLYCERIN 0.4 MG SL tablet Commonly known as: NITROSTAT Place 0.4 mg under the tongue every 5 (five) minutes as needed for chest pain.   ranolazine 1000 MG SR tablet Commonly known as: RANEXA Take by mouth in the morning and at bedtime.   zolpidem 10 MG tablet Commonly known as: AMBIEN Take 10 mg by mouth at bedtime.        Time spent: Review of old chart, current chart, lab, x-ray, cardiac tests and discussion with patient over 60 minutes.  Signed: 04/07/2020 04/06/2020, 9:16 AM

## 2020-11-25 ENCOUNTER — Emergency Department (HOSPITAL_COMMUNITY): Payer: Medicaid Other

## 2020-11-25 ENCOUNTER — Other Ambulatory Visit: Payer: Self-pay

## 2020-11-25 ENCOUNTER — Other Ambulatory Visit (HOSPITAL_COMMUNITY): Payer: Medicaid Other

## 2020-11-25 ENCOUNTER — Encounter (HOSPITAL_COMMUNITY): Payer: Self-pay | Admitting: Emergency Medicine

## 2020-11-25 ENCOUNTER — Inpatient Hospital Stay (HOSPITAL_COMMUNITY)
Admission: EM | Admit: 2020-11-25 | Discharge: 2020-11-29 | DRG: 281 | Disposition: A | Discharge status: Home / Self Care | Payer: Medicaid Other | Attending: Cardiovascular Disease | Admitting: Cardiovascular Disease

## 2020-11-25 DIAGNOSIS — I2510 Atherosclerotic heart disease of native coronary artery without angina pectoris: Secondary | ICD-10-CM | POA: Diagnosis present

## 2020-11-25 DIAGNOSIS — I214 Non-ST elevation (NSTEMI) myocardial infarction: Principal | ICD-10-CM | POA: Diagnosis present

## 2020-11-25 DIAGNOSIS — T82855A Stenosis of coronary artery stent, initial encounter: Secondary | ICD-10-CM | POA: Diagnosis present

## 2020-11-25 DIAGNOSIS — I252 Old myocardial infarction: Secondary | ICD-10-CM

## 2020-11-25 DIAGNOSIS — Z9111 Patient's noncompliance with dietary regimen: Secondary | ICD-10-CM | POA: Diagnosis not present

## 2020-11-25 DIAGNOSIS — Z86718 Personal history of other venous thrombosis and embolism: Secondary | ICD-10-CM | POA: Diagnosis not present

## 2020-11-25 DIAGNOSIS — F17290 Nicotine dependence, other tobacco product, uncomplicated: Secondary | ICD-10-CM | POA: Diagnosis present

## 2020-11-25 DIAGNOSIS — Z6841 Body Mass Index (BMI) 40.0 and over, adult: Secondary | ICD-10-CM | POA: Diagnosis not present

## 2020-11-25 DIAGNOSIS — I249 Acute ischemic heart disease, unspecified: Secondary | ICD-10-CM | POA: Diagnosis present

## 2020-11-25 DIAGNOSIS — I1310 Hypertensive heart and chronic kidney disease without heart failure, with stage 1 through stage 4 chronic kidney disease, or unspecified chronic kidney disease: Secondary | ICD-10-CM | POA: Diagnosis present

## 2020-11-25 DIAGNOSIS — J449 Chronic obstructive pulmonary disease, unspecified: Secondary | ICD-10-CM | POA: Diagnosis present

## 2020-11-25 DIAGNOSIS — E875 Hyperkalemia: Secondary | ICD-10-CM | POA: Diagnosis present

## 2020-11-25 DIAGNOSIS — Y831 Surgical operation with implant of artificial internal device as the cause of abnormal reaction of the patient, or of later complication, without mention of misadventure at the time of the procedure: Secondary | ICD-10-CM | POA: Diagnosis present

## 2020-11-25 DIAGNOSIS — Z8673 Personal history of transient ischemic attack (TIA), and cerebral infarction without residual deficits: Secondary | ICD-10-CM | POA: Diagnosis not present

## 2020-11-25 DIAGNOSIS — Z20822 Contact with and (suspected) exposure to covid-19: Secondary | ICD-10-CM | POA: Diagnosis present

## 2020-11-25 DIAGNOSIS — N1832 Chronic kidney disease, stage 3b: Secondary | ICD-10-CM | POA: Diagnosis present

## 2020-11-25 HISTORY — DX: Malignant (primary) neoplasm, unspecified: C80.1

## 2020-11-25 HISTORY — DX: Bipolar disorder, unspecified: F31.9

## 2020-11-25 HISTORY — DX: Atherosclerotic heart disease of native coronary artery without angina pectoris: I25.10

## 2020-11-25 LAB — BASIC METABOLIC PANEL
Anion gap: 9 (ref 5–15)
BUN: 12 mg/dL (ref 6–20)
CO2: 17 mmol/L — ABNORMAL LOW (ref 22–32)
Calcium: 8.6 mg/dL — ABNORMAL LOW (ref 8.9–10.3)
Chloride: 112 mmol/L — ABNORMAL HIGH (ref 98–111)
Creatinine, Ser: 1.96 mg/dL — ABNORMAL HIGH (ref 0.44–1.00)
GFR, Estimated: 29 mL/min — ABNORMAL LOW (ref >60)
Glucose, Bld: 113 mg/dL — ABNORMAL HIGH (ref 70–99)
Potassium: 4.3 mmol/L (ref 3.5–5.1)
Sodium: 138 mmol/L (ref 135–145)

## 2020-11-25 LAB — CBC
HCT: 40.2 % (ref 36.0–46.0)
Hemoglobin: 13.3 g/dL (ref 12.0–15.0)
MCH: 27.9 pg (ref 26.0–34.0)
MCHC: 33.1 g/dL (ref 30.0–36.0)
MCV: 84.5 fL (ref 80.0–100.0)
Platelets: 260 10*3/uL (ref 150–400)
RBC: 4.76 MIL/uL (ref 3.87–5.11)
RDW: 14.6 % (ref 11.5–15.5)
WBC: 9.6 10*3/uL (ref 4.0–10.5)
nRBC: 0 % (ref 0.0–0.2)

## 2020-11-25 LAB — RESP PANEL BY RT-PCR (FLU A&B, COVID) ARPGX2
Influenza A by PCR: NEGATIVE
Influenza B by PCR: NEGATIVE
SARS Coronavirus 2 by RT PCR: NEGATIVE

## 2020-11-25 LAB — TROPONIN I (HIGH SENSITIVITY)
Troponin I (High Sensitivity): 422 ng/L (ref <18)
Troponin I (High Sensitivity): 791 ng/L (ref <18)

## 2020-11-25 LAB — HEPARIN LEVEL (UNFRACTIONATED)
Heparin Unfractionated: 0.23 IU/mL — ABNORMAL LOW (ref 0.30–0.70)
Heparin Unfractionated: 0.45 IU/mL (ref 0.30–0.70)

## 2020-11-25 MED ORDER — ACETAMINOPHEN 325 MG PO TABS
650.0000 mg | ORAL_TABLET | ORAL | Status: DC | PRN
  Administered 2020-11-25 – 2020-11-28 (×5): 650 mg via ORAL
  Filled 2020-11-25 (×5): qty 2

## 2020-11-25 MED ORDER — RANOLAZINE ER 500 MG PO TB12
1000.0000 mg | ORAL_TABLET | Freq: Two times a day (BID) | ORAL | Status: DC
  Administered 2020-11-25 – 2020-11-28 (×8): 1000 mg via ORAL
  Filled 2020-11-25 (×10): qty 2

## 2020-11-25 MED ORDER — ALLOPURINOL 100 MG PO TABS
100.0000 mg | ORAL_TABLET | Freq: Every day | ORAL | Status: DC
  Administered 2020-11-25 – 2020-11-28 (×4): 100 mg via ORAL
  Filled 2020-11-25 (×5): qty 1

## 2020-11-25 MED ORDER — ALBUTEROL SULFATE (2.5 MG/3ML) 0.083% IN NEBU: 3.0000 mL | INHALATION_SOLUTION | Freq: Every day | RESPIRATORY_TRACT | Status: DC | PRN

## 2020-11-25 MED ORDER — HEPARIN BOLUS VIA INFUSION
1250.0000 [IU] | Freq: Once | INTRAVENOUS | Status: AC
  Administered 2020-11-25: 1250 [IU] via INTRAVENOUS
  Filled 2020-11-25: qty 1250

## 2020-11-25 MED ORDER — FAMOTIDINE IN NACL 20-0.9 MG/50ML-% IV SOLN
20.0000 mg | Freq: Once | INTRAVENOUS | Status: AC
  Administered 2020-11-25: 20 mg via INTRAVENOUS
  Filled 2020-11-25: qty 50

## 2020-11-25 MED ORDER — ONDANSETRON HCL 4 MG/2ML IJ SOLN
4.0000 mg | Freq: Four times a day (QID) | INTRAMUSCULAR | Status: DC | PRN
  Administered 2020-11-25 – 2020-11-28 (×2): 4 mg via INTRAVENOUS
  Filled 2020-11-25 (×2): qty 2

## 2020-11-25 MED ORDER — SODIUM CHLORIDE 0.9% FLUSH: 3.0000 mL | INTRAVENOUS | Status: DC | PRN

## 2020-11-25 MED ORDER — ISOSORBIDE MONONITRATE ER 60 MG PO TB24
60.0000 mg | ORAL_TABLET | Freq: Every day | ORAL | Status: DC
  Administered 2020-11-25 – 2020-11-28 (×4): 60 mg via ORAL
  Filled 2020-11-25: qty 1
  Filled 2020-11-25: qty 2
  Filled 2020-11-25 (×3): qty 1

## 2020-11-25 MED ORDER — HEPARIN (PORCINE) 25000 UT/250ML-% IV SOLN
1350.0000 [IU]/h | INTRAVENOUS | Status: DC
  Administered 2020-11-25: 1200 [IU]/h via INTRAVENOUS
  Administered 2020-11-25 – 2020-11-26 (×2): 1350 [IU]/h via INTRAVENOUS
  Filled 2020-11-25 (×4): qty 250

## 2020-11-25 MED ORDER — HEPARIN BOLUS VIA INFUSION
4000.0000 [IU] | Freq: Once | INTRAVENOUS | Status: AC
  Administered 2020-11-25: 4000 [IU] via INTRAVENOUS
  Filled 2020-11-25: qty 4000

## 2020-11-25 MED ORDER — SODIUM CHLORIDE 0.9 % IV SOLN: 250.0000 mL | INTRAVENOUS | Status: DC | PRN

## 2020-11-25 MED ORDER — CARVEDILOL 12.5 MG PO TABS
12.5000 mg | ORAL_TABLET | Freq: Two times a day (BID) | ORAL | Status: DC
  Administered 2020-11-25 – 2020-11-28 (×7): 12.5 mg via ORAL
  Filled 2020-11-25 (×8): qty 1

## 2020-11-25 MED ORDER — ZOLPIDEM TARTRATE 5 MG PO TABS
5.0000 mg | ORAL_TABLET | Freq: Every evening | ORAL | Status: DC | PRN
  Administered 2020-11-25 – 2020-11-28 (×4): 5 mg via ORAL
  Filled 2020-11-25 (×4): qty 1

## 2020-11-25 MED ORDER — ATORVASTATIN CALCIUM 80 MG PO TABS
80.0000 mg | ORAL_TABLET | Freq: Every day | ORAL | Status: DC
  Administered 2020-11-25 – 2020-11-28 (×4): 80 mg via ORAL
  Filled 2020-11-25 (×4): qty 1

## 2020-11-25 MED ORDER — NITROGLYCERIN 0.4 MG SL SUBL
0.4000 mg | SUBLINGUAL_TABLET | SUBLINGUAL | Status: DC | PRN
  Administered 2020-11-25 – 2020-11-27 (×5): 0.4 mg via SUBLINGUAL
  Filled 2020-11-25 (×4): qty 1

## 2020-11-25 MED ORDER — ASPIRIN EC 81 MG PO TBEC
81.0000 mg | DELAYED_RELEASE_TABLET | Freq: Every day | ORAL | Status: DC
  Administered 2020-11-26 – 2020-11-28 (×3): 81 mg via ORAL
  Filled 2020-11-25 (×4): qty 1

## 2020-11-25 MED ORDER — SODIUM CHLORIDE 0.9% FLUSH: 3.0000 mL | Freq: Two times a day (BID) | INTRAVENOUS | Status: DC

## 2020-11-25 NOTE — ED Notes (Signed)
Date and time results received: 11/25/20 7:30 AM  (use smartphrase ".now" to insert current time)  Test: Trop Critical Value: 422  Name of Provider Notified: PA, [place]  Orders Received? Or Actions Taken?: see chart

## 2020-11-25 NOTE — Progress Notes (Signed)
ANTICOAGULATION CONSULT NOTE - Initial Consult  Pharmacy Consult for Heparin Indication: chest pain/ACS  No Known Allergies  Patient Measurements: Height: '5\' 3"'$  (160 cm) Weight: 129.3 kg (285 lb) IBW/kg (Calculated) : 52.4 Heparin Dosing Weight: 85 kg  Vital Signs: Temp: 97.7 F (36.5 C) (08/20 0558) Temp Source: Oral (08/20 0558) BP: 168/114 (08/20 0615) Pulse Rate: 66 (08/20 0615)  Labs: Recent Labs    11/25/20 0602  HGB 13.3  HCT 40.2  PLT 260  CREATININE 1.96*  TROPONINIHS 422*    Estimated Creatinine Clearance: 41.6 mL/min (A) (by C-G formula based on SCr of 1.96 mg/dL (H)).   Medical History: Past Medical History:  Diagnosis Date   COPD (chronic obstructive pulmonary disease) ([place])    Diverticulitis    Hypertension    Myocardial infarction ([place])    Seizure ([place])    Stroke ([place])     Medications:  See electronic med rec  Assessment: 55 y.o. F presents with CP. To begin heparin for ACS. No AC PTA. CBC ok on admission  Goal of Therapy:  Heparin level 0.3-0.7 units/ml Monitor platelets by anticoagulation protocol: Yes   Plan:  Heparin IV bolus 4000 Heparin gtt at 1200 units/hr Will f/u heparin level in 6 hours Daily heparin level and CBC  Sherlon Handing, PharmD, BCPS Please see amion for complete clinical pharmacist phone list 11/25/2020,7:50 AM

## 2020-11-25 NOTE — Progress Notes (Signed)
ANTICOAGULATION CONSULT NOTE - Follow-up Consult  Pharmacy Consult for Heparin Indication: chest pain/ACS  No Known Allergies  Patient Measurements: Height: '5\' 3"'$  (160 cm) Weight: 121.3 kg (267 lb 8 oz) IBW/kg (Calculated) : 52.4 Heparin Dosing Weight: 85 kg  Vital Signs: Temp: 97.5 F (36.4 C) (08/20 2300) Temp Source: Axillary (08/20 2300) BP: 126/71 (08/20 2300) Pulse Rate: 71 (08/20 2300)  Labs: Recent Labs    11/25/20 0602 11/25/20 0802 11/25/20 1431 11/25/20 2315  HGB 13.3  --   --   --   HCT 40.2  --   --   --   PLT 260  --   --   --   HEPARINUNFRC  --   --  0.23* 0.45  CREATININE 1.96*  --   --   --   TROPONINIHS 422* 791*  --   --      Estimated Creatinine Clearance: 40 mL/min (A) (by C-G formula based on SCr of 1.96 mg/dL (H)).   Medical History: Past Medical History:  Diagnosis Date   Bipolar disorder ([place])    Cancer ([place])    COPD (chronic obstructive pulmonary disease) ([place])    Coronary artery disease    Diverticulitis    Hypertension    Myocardial infarction ([place])    Seizure ([place])    Stroke [facility])     Assessment: Ms. Westergaard is a 55 y.o. F presenting with CP. Plan is for heart cath. Pharmacy was asked to dose  heparin for ACS. No anticoagulation noted PTA.   Heparin level therapeutic (0.45) on gtt at 1350 units/hr. No bleeding noted.  Goal of Therapy:  Heparin level 0.3-0.7 units/ml Monitor platelets by anticoagulation protocol: Yes   Plan:  Continue IV heparin gtt at 1350 units/h Daily heparin level and CBC  Sherlon Handing, PharmD, BCPS Please see amion for complete clinical pharmacist phone list 11/25/2020 11:55 PM

## 2020-11-25 NOTE — H&P (Signed)
Referring Physician: Gilford Raid MD/[place], PA  Colleen Curtis is an 55 y.o. female.                       Chief Complaint: Chest pain  HPI: 55 years old female with PMH of CAD, s/p 4 stents placement in [place], Hypertension, morbid obesity, stroke, seizures and prior VTE with filter placement has recurrent chest pain for 3 months. She had chest pressure with radiation to LUE and Jaw along with Nausea and dyspnea. She received 4 baby aspirin and SL NTG with resolution of chest pain. Her HS-troponin I is elevated and rising. EKG shows old ASWMI and subtle inferior wall elevation. Chest x-ray showes cardiomegaly and no edema.   Past Medical History:  Diagnosis Date   COPD (chronic obstructive pulmonary disease) ([place])    Diverticulitis    Hypertension    Myocardial infarction ([place])    Seizure ([place])    Stroke [facility])       Past Surgical History:  Procedure Laterality Date   ABDOMINAL SURGERY     CHOLECYSTECTOMY      History reviewed. No pertinent family history. Social History:  reports that she has been smoking cigars. She has never used smokeless tobacco. She reports that she does not drink alcohol and does not use drugs.  Allergies: No Known Allergies  (Not in a hospital admission)   Results for orders placed or performed during the hospital encounter of 11/25/20 (from the past 48 hour(s))  Basic metabolic panel     Status: Abnormal   Collection Time: 11/25/20  6:02 AM  Result Value Ref Range   Sodium 138 135 - 145 mmol/L   Potassium 4.3 3.5 - 5.1 mmol/L   Chloride 112 (H) 98 - 111 mmol/L   CO2 17 (L) 22 - 32 mmol/L   Glucose, Bld 113 (H) 70 - 99 mg/dL    Comment: Glucose reference range applies only to samples taken after fasting for at least 8 hours.   BUN 12 6 - 20 mg/dL   Creatinine, Ser 1.96 (H) 0.44 - 1.00 mg/dL   Calcium 8.6 (L) 8.9 - 10.3 mg/dL   GFR, Estimated 29 (L) >60 mL/min    Comment: (NOTE) Calculated using the CKD-EPI Creatinine Equation (2021)     Anion gap 9 5 - 15    Comment: Performed at [facility] [address], [place], [place] [address]  CBC     Status: None   Collection Time: 11/25/20  6:02 AM  Result Value Ref Range   WBC 9.6 4.0 - 10.5 K/uL   RBC 4.76 3.87 - 5.11 MIL/uL   Hemoglobin 13.3 12.0 - 15.0 g/dL   HCT 40.2 36.0 - 46.0 %   MCV 84.5 80.0 - 100.0 fL   MCH 27.9 26.0 - 34.0 pg   MCHC 33.1 30.0 - 36.0 g/dL   RDW 14.6 11.5 - 15.5 %   Platelets 260 150 - 400 K/uL   nRBC 0.0 0.0 - 0.2 %    Comment: Performed at [facility], [place] [address], [place], [place] [address]  Troponin I (High Sensitivity)     Status: Abnormal   Collection Time: 11/25/20  6:02 AM  Result Value Ref Range   Troponin I (High Sensitivity) 422 (HH) <18 ng/L    Comment: CRITICAL RESULT CALLED TO, READ BACK BY AND VERIFIED WITH: M DOSS RN BY SSTEPHENS [phone] [id number] (NOTE) Elevated high sensitivity troponin I (hsTnI) values and significant  changes across serial measurements may suggest ACS but many other  chronic and acute conditions are known to elevate hsTnI results.  Refer to the Links section for chest pain algorithms and additional  guidance. Performed at [facility], [place] [address] [place], [place] [address]   Resp Panel by RT-PCR (Flu A&B, Covid) Nasopharyngeal Swab     Status: None   Collection Time: 11/25/20  7:38 AM   Specimen: Nasopharyngeal Swab; Nasopharyngeal(NP) swabs in vial transport medium  Result Value Ref Range   SARS Coronavirus 2 by RT PCR NEGATIVE NEGATIVE    Comment: (NOTE) SARS-CoV-2 target nucleic acids are NOT DETECTED.  The SARS-CoV-2 RNA is generally detectable in upper respiratory specimens during the acute phase of infection. The lowest concentration of SARS-CoV-2 viral copies this assay can detect is 138 copies/mL. A negative result does not preclude SARS-Cov-2 infection and should not be used as the sole basis for treatment or other patient management decisions. A negative result may  occur with  improper specimen collection/handling, submission of specimen other than nasopharyngeal swab, presence of viral mutation(s) within the areas targeted by this assay, and inadequate number of viral copies(<138 copies/mL). A negative result must be combined with clinical observations, patient history, and epidemiological information. The expected result is Negative.  Fact Sheet for Patients:  EntrepreneurPulse.com.au  Fact Sheet for Healthcare Providers:  IncredibleEmployment.be  This test is no t yet approved or cleared by the [place] FDA and  has been authorized for detection and/or diagnosis of SARS-CoV-2 by FDA under an Emergency Use Authorization (EUA). This EUA will remain  in effect (meaning this test can be used) for the duration of the COVID-19 declaration under Section 564(b)(1) of the Act, 21 U.S.C.section 360bbb-3(b)(1), unless the authorization is terminated  or revoked sooner.       Influenza A by PCR NEGATIVE NEGATIVE   Influenza B by PCR NEGATIVE NEGATIVE    Comment: (NOTE) The Xpert Xpress SARS-CoV-2/FLU/RSV plus assay is intended as an aid in the diagnosis of influenza from Nasopharyngeal swab specimens and should not be used as a sole basis for treatment. Nasal washings and aspirates are unacceptable for Xpert Xpress SARS-CoV-2/FLU/RSV testing.  Fact Sheet for Patients: EntrepreneurPulse.com.au  Fact Sheet for Healthcare Providers: IncredibleEmployment.be  This test is not yet approved or cleared by the [place] FDA and has been authorized for detection and/or diagnosis of SARS-CoV-2 by FDA under an Emergency Use Authorization (EUA). This EUA will remain in effect (meaning this test can be used) for the duration of the COVID-19 declaration under Section 564(b)(1) of the Act, 21 U.S.C. section 360bbb-3(b)(1), unless the authorization is terminated  or revoked.  Performed at [facility], [place] [address], [place], [place] [address]   Troponin I (High Sensitivity)     Status: Abnormal   Collection Time: 11/25/20  8:02 AM  Result Value Ref Range   Troponin I (High Sensitivity) 791 (HH) <18 ng/L    Comment: CRITICAL VALUE NOTED.  VALUE IS CONSISTENT WITH PREVIOUSLY REPORTED AND CALLED VALUE. (NOTE) Elevated high sensitivity troponin I (hsTnI) values and significant  changes across serial measurements may suggest ACS but many other  chronic and acute conditions are known to elevate hsTnI results.  Refer to the Links section for chest pain algorithms and additional  guidance. Performed at [facility], [place] [address] [place], [place] [address]    DG Chest 2 View  Result Date: 11/25/2020 CLINICAL DATA:  Chest pain for 2  days with shortness of breath on exertion EXAM: CHEST - 2 VIEW COMPARISON:  04/04/2020 FINDINGS: Cervical spine fixation. Midline trachea. Moderate cardiomegaly. Mediastinal contours otherwise within normal limits. No pleural effusion or pneumothorax. Pulmonary interstitial prominence is favored to be due to relatively low lung volumes and patient body habitus. No overt congestive failure. IMPRESSION: Cardiomegaly, without congestive failure. Electronically Signed   By: Abigail Miyamoto M.D.   On: 11/25/2020 07:06    Review Of Systems Constitutional: No fever, chills, Positive chronic weight gain. Eyes: No vision change, wears glasses. No discharge or pain. Ears: No hearing loss, No tinnitus. Respiratory: No asthma, COPD, pneumonias. No shortness of breath. No hemoptysis. Cardiovascular: Positive chest pain, palpitation, no leg edema. Gastrointestinal: No nausea, vomiting, diarrhea, constipation. No GI bleed. No hepatitis. Genitourinary: No dysuria, hematuria, kidney stone. No incontinance. Neurological: No headache, stroke, h/o seizures.  Psychiatry: No psych facility admission for anxiety, depression,  suicide. No detox. Skin: No rash. Musculoskeletal: Positive joint pain, fibromyalgia, neck pain, back pain. Lymphadenopathy: No lymphadenopathy. Hematology: No anemia or easy bruising.   Blood pressure (!) 150/127, pulse 67, temperature 97.7 F (36.5 C), temperature source Oral, resp. rate 12, height '5\' 3"'$  (1.6 m), weight 129.3 kg, SpO2 100 %. Body mass index is 50.49 kg/m. General appearance: alert, cooperative, appears stated age and no distress Head: Normocephalic, atraumatic. Eyes: Brown eyes, pink conjunctiva, corneas clear. PERRL, EOM's intact. Neck: No adenopathy, no carotid bruit, no JVD, supple, symmetrical, trachea midline and thyroid not enlarged. Resp: Clear to auscultation bilaterally. Cardio: Regular rate and rhythm, S1, S2 normal, II/VI systolic murmur, no click, rub or gallop GI: Soft, non-tender; bowel sounds normal; no organomegaly. Extremities: No edema, cyanosis or clubbing. Skin: Warm and dry.  Neurologic: Alert and oriented X 3, normal strength. Normal coordination.  Assessment/Plan Acute coronary syndrome CAD S/P coronary stents Morbid obesity Hypertension, uncontrolled Tobacco use disorder  Admit. IV heparin Home medications, Beta-blocker, Atorvastatin. Discussed cardiac cath procedure, risks and alternatives. Patient agrees to the procedure.  Time spent: Review of old records, Lab, x-rays, EKG, other cardiac tests, examination, discussion with patient/Nurse/PA over 70 minutes.  Birdie Riddle, MD  11/25/2020, 9:44 AM

## 2020-11-25 NOTE — Progress Notes (Signed)
ANTICOAGULATION CONSULT NOTE - Follow-up Consult  Pharmacy Consult for Heparin Indication: chest pain/ACS  No Known Allergies  Patient Measurements: Height: '5\' 3"'$  (160 cm) Weight: 121.3 kg (267 lb 8 oz) IBW/kg (Calculated) : 52.4 Heparin Dosing Weight: 85 kg  Vital Signs: Temp: 98.4 F (36.9 C) (08/20 1316) Temp Source: Oral (08/20 1316) BP: 150/115 (08/20 1330) Pulse Rate: 76 (08/20 1330)  Labs: Recent Labs    11/25/20 0602 11/25/20 0802 11/25/20 1431  HGB 13.3  --   --   HCT 40.2  --   --   PLT 260  --   --   HEPARINUNFRC  --   --  0.23*  CREATININE 1.96*  --   --   TROPONINIHS 422* 791*  --      Estimated Creatinine Clearance: 40 mL/min (A) (by C-G formula based on SCr of 1.96 mg/dL (H)).   Medical History: Past Medical History:  Diagnosis Date   Bipolar disorder ([place])    Cancer ([place])    COPD (chronic obstructive pulmonary disease) ([place])    Coronary artery disease    Diverticulitis    Hypertension    Myocardial infarction ([place])    Seizure ([place])    Stroke [facility])     Assessment: Colleen Curtis is a 55 y.o. F presenting with CP. Plan is for heart cath. Pharmacy was asked to dose  heparin for ACS. No anticoagulation noted PTA.   Heparin level today 0.23 and subtherapeutic on 1200 units/hr. H/H and PLT are stable and WNL. No signs of bleeding or IV site issues per RN.   Goal of Therapy:  Heparin level 0.3-0.7 units/ml Monitor platelets by anticoagulation protocol: Yes   Plan:  Heparin IV bolus 1250 units x 1 Increase IV heparin gtt to 1350 units/h F/u heparin level in 6 hours Daily heparin level and CBC Monitor for signs and symptoms of bleeding  Thank you for involving pharmacy in this patient's care.  Elita Quick, PharmD PGY1 Ambulatory Care Pharmacy Resident 11/25/2020 4:00 PM  **Pharmacist phone directory can be found on [email or web address] listed under [place]**

## 2020-11-25 NOTE — ED Provider Notes (Signed)
[facility] EMERGENCY DEPARTMENT Provider Note   CSN: [id number] Arrival date & time: 11/25/20  0547     History Chief Complaint  Patient presents with   Chest Pain    Colleen Curtis is a 55 y.o. female with a hx of CAD S/p stent placement, hypertension, stroke, seizures, prior VTE with filter & COPD who presents to the ED with complaints of chest pain x 3 days. Patient states pain is located to the left chest, radiates into the LUE and to the jaw some, feels like a tightness/pressure with associated nausea/dyspnea. This was getting progressively worse prompting EMS call- received nitroglycerin and aspirin with resolution of pain. She states this is similar to her prior MI. She does not see a cardiologist locally. She also is having some indigestion burning sensation right now which she states is separate from her prior chest pain. She denies vomiting, dizziness, syncope, hemoptysis or leg pain/swelling.   HPI     Past Medical History:  Diagnosis Date   COPD (chronic obstructive pulmonary disease) ([place])    Diverticulitis    Hypertension    Myocardial infarction ([place])    Seizure ([place])    Stroke [facility])     Patient Active Problem List   Diagnosis Date Noted   Acute coronary syndrome ([place]) 04/04/2020    Past Surgical History:  Procedure Laterality Date   ABDOMINAL SURGERY     CHOLECYSTECTOMY       OB History   No obstetric history on file.     No family history on file.  Social History   Tobacco Use   Smoking status: Some Days    Types: Cigars   Smokeless tobacco: Never  Vaping Use   Vaping Use: Never used  Substance Use Topics   Alcohol use: Never   Drug use: Never    Comment: " medical Marijuana "    Home Medications Prior to Admission medications   Medication Sig Start Date End Date Taking? Authorizing Provider  albuterol (VENTOLIN HFA) 108 (90 Base) MCG/ACT inhaler Inhale 1 puff into the lungs daily as needed for wheezing or  shortness of breath.   Yes [provider]  allopurinol (ZYLOPRIM) 100 MG tablet Take 100 mg by mouth daily.   Yes [provider]  atorvastatin (LIPITOR) 40 MG tablet Take 80 mg by mouth at bedtime. 09/17/19  Yes [provider]  carvedilol (COREG) 12.5 MG tablet Take 12.5 mg by mouth 2 (two) times daily with a meal.   Yes [provider]  isosorbide mononitrate (IMDUR) 60 MG 24 hr tablet Take 60 mg by mouth daily.   Yes [provider]  nitroGLYCERIN (NITROSTAT) 0.4 MG SL tablet Place 0.4 mg under the tongue every 5 (five) minutes as needed for chest pain.   Yes [provider]  ranolazine (RANEXA) 1000 MG SR tablet Take 1,000 mg by mouth in the morning and at bedtime. 09/17/19  Yes [provider]  zolpidem (AMBIEN) 10 MG tablet Take 10 mg by mouth at bedtime.   Yes [provider]    Allergies    Patient has no known allergies.  Review of Systems   Review of Systems  Constitutional:  Negative for chills and fever.  Respiratory:  Positive for shortness of breath.   Cardiovascular:  Positive for chest pain. Negative for leg swelling.  Gastrointestinal:  Positive for nausea. Negative for abdominal pain and vomiting.  Genitourinary:  Negative for dysuria.  Neurological:  Negative for dizziness  and syncope.  All other systems reviewed and are negative.  Physical Exam Updated Vital Signs BP (!) 168/114   Pulse 66   Temp 97.7 F (36.5 C) (Oral)   Resp 17   Ht '5\' 3"'$  (1.6 m)   Wt 129.3 kg   SpO2 100%   BMI 50.49 kg/m   Physical Exam Vitals and nursing note reviewed.  Constitutional:      General: She is not in acute distress.    Appearance: She is well-developed. She is not toxic-appearing.  HENT:     Head: Normocephalic and atraumatic.  Eyes:     General:        Right eye: No discharge.        Left eye: No discharge.     Conjunctiva/sclera: Conjunctivae normal.  Cardiovascular:     Rate and Rhythm:  Normal rate and regular rhythm.     Pulses:          Radial pulses are 2+ on the right side and 2+ on the left side.  Pulmonary:     Effort: Pulmonary effort is normal. No respiratory distress.     Breath sounds: Normal breath sounds. No wheezing, rhonchi or rales.  Abdominal:     General: There is no distension.     Palpations: Abdomen is soft.     Tenderness: There is no abdominal tenderness.  Musculoskeletal:     Cervical back: Neck supple.     Right lower leg: No tenderness. No edema.     Left lower leg: No tenderness. No edema.  Skin:    General: Skin is warm and dry.     Findings: No rash.  Neurological:     Mental Status: She is alert.     Comments: Clear speech.   Psychiatric:        Behavior: Behavior normal.    ED Results / Procedures / Treatments   Labs (all labs ordered are listed, but only abnormal results are displayed) Labs Reviewed  BASIC METABOLIC PANEL - Abnormal; Notable for the following components:      Result Value   Chloride 112 (*)    CO2 17 (*)    Glucose, Bld 113 (*)    Creatinine, Ser 1.96 (*)    Calcium 8.6 (*)    GFR, Estimated 29 (*)    All other components within normal limits  TROPONIN I (HIGH SENSITIVITY) - Abnormal; Notable for the following components:   Troponin I (High Sensitivity) 422 (*)    All other components within normal limits  RESP PANEL BY RT-PCR (FLU A&B, COVID) [id number]  CBC  TROPONIN I (HIGH SENSITIVITY)    EKG EKG Interpretation  Date/Time:  Saturday November 25 2020 05:54:00 EDT Ventricular Rate:  69 PR Interval:  158 QRS Duration: 101 QT Interval:  456 QTC Calculation: 489 R Axis:   38 Text Interpretation: Sinus rhythm Anterior infarct, old No significant change since 04/05/2020 Confirmed by Veryl Speak [id number]) on 11/25/2020 6:10:15 AM  Radiology DG Chest 2 View  Result Date: 11/25/2020 CLINICAL DATA:  Chest pain for 2 days with shortness of breath on exertion EXAM: CHEST - 2 VIEW COMPARISON:  04/04/2020  FINDINGS: Cervical spine fixation. Midline trachea. Moderate cardiomegaly. Mediastinal contours otherwise within normal limits. No pleural effusion or pneumothorax. Pulmonary interstitial prominence is favored to be due to relatively low lung volumes and patient body habitus. No overt congestive failure. IMPRESSION: Cardiomegaly, without congestive failure. Electronically Signed   By: Adria Devon.D.  On: 11/25/2020 07:06    Procedures .Critical Care  Date/Time: 11/25/2020 9:29 AM Performed by: Amaryllis Dyke, PA-C Authorized by: Amaryllis Dyke, PA-C    CRITICAL CARE Performed by: Kennith Maes   Total critical care time: 45 minutes  Critical care time was exclusive of separately billable procedures and treating other patients.  Critical care was necessary to treat or prevent imminent or life-threatening deterioration.  Critical care was time spent personally by me on the following activities: development of treatment plan with patient and/or surrogate as well as nursing, discussions with consultants, evaluation of patient's response to treatment, examination of patient, obtaining history from patient or surrogate, ordering and performing treatments and interventions, ordering and review of laboratory studies, ordering and review of radiographic studies, pulse oximetry and re-evaluation of patient's condition.   Medications Ordered in ED Medications  famotidine (PEPCID) IVPB 20 mg premix (has no administration in time range)    ED Course  I have reviewed the triage vital signs and the nursing notes.  Pertinent labs & imaging results that were available during my care of the patient were reviewed by me and considered in my medical decision making (see chart for details).    MDM Rules/Calculators/A&P                           Patient presents to the emergency department with chest pain. Patient nontoxic appearing, in no apparent distress, vitals with  elevated BP.Marland Kitchen Fairly benign physical exam.   DDX: ACS, pulmonary embolism, dissection, pneumothorax, pneumonia, arrhythmia, severe anemia, MSK, GERD, anxiety.   Additional history obtained:  Additional history obtained from chart review & nursing note review. CP admission December 2021. NM scan:  1. No obvious perfusion defects on the stress images.  2. Global hypokinesis and possible left ventricular aneurysm. 3. Left ventricular ejection fraction 32% 4. Non invasive risk stratification*: High risk    EKG: Sinus rhythm Anterior infarct, old No significant change since   Lab Tests:  I reviewed and interpreted labs, which included:  CBC: unremarkable BMP: renal function similar to prior Troponin: elevated @ 422  Imaging Studies ordered:  I ordered imaging studies which included CXR, I independently reviewed, formal radiology impression shows:  Cardiomegaly, without congestive failure  ED Course:  Patient with high risk heart pathway score, EKG without significant change, troponin elevated @ 422- concern for NSTEMI. Heparin ordered. Consult placed to cardiology service. Patient reports no chest pain currently, she does mention some burning indigestion that feels different- pepcid ordered for this as well.  08:40: CONSULT: Discussed with cardiologist Dr. Doylene Canard- will see patient in the ED.   Delta troponin elevated. Patient with return of CP on attending's evaluation- nitroglycerin ordered.   Patient admitted to cardiology service.   Findings and plan of care discussed with supervising physician Dr. Gilford Raid who has evaluated patient as shared visit & is in agreement.   Portions of this note were generated with [employer]. Dictation errors may occur despite best attempts at proofreading.  Final Clinical Impression(s) / ED Diagnoses Final diagnoses:  NSTEMI (non-ST elevated myocardial infarction) [facility])    Rx / DC Orders ED Discharge Orders     None         Amaryllis Dyke, PA-C 11/25/20 1043    Isla Pence, MD 11/26/20 [phone]

## 2020-11-25 NOTE — ED Triage Notes (Signed)
Pt here by [facility] with c/o "crushing chest pain" that radiates to L arm x 2 days, with SOB with exertion; pt given 324 asa and 4 doses of 0.4 SL nitro en route; v/s en route 140/100; 98%, RR 18-20, NSR

## 2020-11-26 ENCOUNTER — Inpatient Hospital Stay (HOSPITAL_COMMUNITY): Payer: Medicaid Other

## 2020-11-26 LAB — CBC
HCT: 39.1 % (ref 36.0–46.0)
Hemoglobin: 12.7 g/dL (ref 12.0–15.0)
MCH: 27.3 pg (ref 26.0–34.0)
MCHC: 32.5 g/dL (ref 30.0–36.0)
MCV: 83.9 fL (ref 80.0–100.0)
Platelets: 255 10*3/uL (ref 150–400)
RBC: 4.66 MIL/uL (ref 3.87–5.11)
RDW: 14.6 % (ref 11.5–15.5)
WBC: 10.3 10*3/uL (ref 4.0–10.5)
nRBC: 0 % (ref 0.0–0.2)

## 2020-11-26 LAB — ECHOCARDIOGRAM COMPLETE
AR max vel: 2.79 cm2
AV Area VTI: 2.41 cm2
AV Area mean vel: 2.57 cm2
AV Mean grad: 3 mmHg
AV Peak grad: 6.7 mmHg
Ao pk vel: 1.29 m/s
Area-P 1/2: 2.49 cm2
Height: 63 in
MV M vel: 4.53 m/s
MV Peak grad: 82.1 mmHg
Radius: 0.6 cm
S' Lateral: 3.2 cm
Weight: 4321.02 oz

## 2020-11-26 LAB — BASIC METABOLIC PANEL
Anion gap: 8 (ref 5–15)
BUN: 16 mg/dL (ref 6–20)
CO2: 18 mmol/L — ABNORMAL LOW (ref 22–32)
Calcium: 8.4 mg/dL — ABNORMAL LOW (ref 8.9–10.3)
Chloride: 109 mmol/L (ref 98–111)
Creatinine, Ser: 2.17 mg/dL — ABNORMAL HIGH (ref 0.44–1.00)
GFR, Estimated: 26 mL/min — ABNORMAL LOW (ref >60)
Glucose, Bld: 94 mg/dL (ref 70–99)
Potassium: 4.5 mmol/L (ref 3.5–5.1)
Sodium: 135 mmol/L (ref 135–145)

## 2020-11-26 LAB — LIPID PANEL
Cholesterol: 183 mg/dL (ref 0–200)
HDL: 40 mg/dL — ABNORMAL LOW (ref >40)
LDL Cholesterol: 111 mg/dL — ABNORMAL HIGH (ref 0–99)
Total CHOL/HDL Ratio: 4.6 RATIO
Triglycerides: 161 mg/dL — ABNORMAL HIGH (ref <150)
VLDL: 32 mg/dL (ref 0–40)

## 2020-11-26 LAB — HEPARIN LEVEL (UNFRACTIONATED): Heparin Unfractionated: 0.4 IU/mL (ref 0.30–0.70)

## 2020-11-26 LAB — TROPONIN I (HIGH SENSITIVITY): Troponin I (High Sensitivity): 5967 ng/L (ref <18)

## 2020-11-26 MED ORDER — SODIUM CHLORIDE 0.9 % IV SOLN: INTRAVENOUS | Status: DC

## 2020-11-26 MED ORDER — AMLODIPINE BESYLATE 5 MG PO TABS
5.0000 mg | ORAL_TABLET | Freq: Every day | ORAL | Status: DC
  Administered 2020-11-26 – 2020-11-28 (×3): 5 mg via ORAL
  Filled 2020-11-26 (×4): qty 1

## 2020-11-26 NOTE — Progress Notes (Signed)
Troponin I level noted. No chest pain and no EKG changes of ST elevation. Patient scheduled for cardiac cath tomorrow pending stabilization of renal function.  Discussed procedure with patient and brother who was visiting her.  Dixie Dials, MD 11/26/2020 at 4:59 PM

## 2020-11-26 NOTE — Progress Notes (Signed)
Ref: Pcp, No   Subjective:  Awake. No chest pain. Monitor: Sinus rhythm. Creatinine slightly up without lasix use. Echocardiogram with moderate to sever LVH and mild LV systolic dysfunction. EF 50-55 %  Objective:  Vital Signs in the last 24 hours: Temp:  [97.5 F (36.4 C)-98.7 F (37.1 C)] 98.5 F (36.9 C) (08/21 0746) Pulse Rate:  [61-103] 75 (08/21 0814) Cardiac Rhythm: Normal sinus rhythm (08/21 0746) Resp:  [15-22] 15 (08/21 0746) BP: (107-175)/(71-115) 149/97 (08/21 0814) SpO2:  [97 %-100 %] 100 % (08/20 1918) Weight:  [121.3 kg-122.5 kg] 122.5 kg (08/21 0500)  Physical Exam: BP Readings from Last 1 Encounters:  11/26/20 (!) 149/97     Wt Readings from Last 1 Encounters:  11/26/20 122.5 kg    Weight change: -7.938 kg Body mass index is 47.84 kg/m. HEENT: [place]/AT, Eyes-Brown, Conjunctiva-Pink, Sclera-Non-icteric Neck: No JVD, No bruit, Trachea midline. Lungs:  Clear, Bilateral. Cardiac:  Regular rhythm, normal S1 and S2, no S3. II/VI systolic murmur. Abdomen:  Soft, non-tender. BS present. Extremities:  No edema present. No cyanosis. No clubbing. CNS: AxOx3, Cranial nerves grossly intact, moves all 4 extremities.  Skin: Warm and dry.   Intake/Output from previous day: 08/20 0701 - 08/21 0700 In: 605.6 [P.O.:240; I.V.:315.6; IV Piggyback:50] Out: -     Lab Results: BMET    Component Value Date/Time   NA 135 11/26/2020 0326   NA 138 11/25/2020 0602   NA 139 04/05/2020 0242   K 4.5 11/26/2020 0326   K 4.3 11/25/2020 0602   K 4.0 04/05/2020 0242   CL 109 11/26/2020 0326   CL 112 (H) 11/25/2020 0602   CL 108 04/05/2020 0242   CO2 18 (L) 11/26/2020 0326   CO2 17 (L) 11/25/2020 0602   CO2 20 (L) 04/05/2020 0242   GLUCOSE 94 11/26/2020 0326   GLUCOSE 113 (H) 11/25/2020 0602   GLUCOSE 100 (H) 04/05/2020 0242   BUN 16 11/26/2020 0326   BUN 12 11/25/2020 0602   BUN 13 04/05/2020 0242   CREATININE 2.17 (H) 11/26/2020 0326   CREATININE 1.96 (H) 11/25/2020  0602   CREATININE 1.88 (H) 04/05/2020 0242   CALCIUM 8.4 (L) 11/26/2020 0326   CALCIUM 8.6 (L) 11/25/2020 0602   CALCIUM 8.6 (L) 04/05/2020 0242   GFRNONAA 26 (L) 11/26/2020 0326   GFRNONAA 29 (L) 11/25/2020 0602   GFRNONAA 31 (L) 04/05/2020 0242   CBC    Component Value Date/Time   WBC 10.3 11/26/2020 0326   RBC 4.66 11/26/2020 0326   HGB 12.7 11/26/2020 0326   HCT 39.1 11/26/2020 0326   PLT 255 11/26/2020 0326   MCV 83.9 11/26/2020 0326   MCH 27.3 11/26/2020 0326   MCHC 32.5 11/26/2020 0326   RDW 14.6 11/26/2020 0326   HEPATIC Function Panel No results for input(s): PROT in the last 8760 hours.  Invalid input(s):  ALBUMIN,  AST,  ALT,  ALKPHOS,  BILIDIR,  IBILI HEMOGLOBIN A1C No components found for: HGA1C,  MPG CARDIAC ENZYMES No results found for: CKTOTAL, CKMB, CKMBINDEX, TROPONINI BNP No results for input(s): PROBNP in the last 8760 hours. TSH No results for input(s): TSH in the last 8760 hours. CHOLESTEROL Recent Labs    04/05/20 0243 11/26/20 0326  CHOL 218* 183    Scheduled Meds:  allopurinol  100 mg Oral Daily   aspirin EC  81 mg Oral Daily   atorvastatin  80 mg Oral QHS   carvedilol  12.5 mg Oral BID WC   isosorbide  mononitrate  60 mg Oral Daily   ranolazine  1,000 mg Oral BID   sodium chloride flush  3 mL Intravenous Q12H   Continuous Infusions:  sodium chloride     sodium chloride     heparin 1,350 Units/hr (11/26/20 0444)   PRN Meds:.sodium chloride, acetaminophen, albuterol, nitroGLYCERIN, ondansetron (ZOFRAN) IV, sodium chloride flush, zolpidem  Assessment/Plan:  NSTEMI, acute CKD IIIb CAD S/P coronary stents Morbid obesity HTN, improving control Tobacco use disorder  Plan: IV fluids. Possible cardiac cath on Monday or Tuesday.   LOS: 1 day   Time spent including chart review, lab review, examination, discussion with patient/Nurse/Pharmacy : 35 min   Dixie Dials  MD  11/26/2020, 10:42 AM

## 2020-11-26 NOTE — Progress Notes (Signed)
  Echocardiogram 2D Echocardiogram has been performed.  Colleen Curtis 11/26/2020, 10:31 AM

## 2020-11-26 NOTE — Progress Notes (Signed)
ANTICOAGULATION CONSULT NOTE - Follow-up Consult  Pharmacy Consult for Heparin Indication: chest pain/ACS  No Known Allergies  Patient Measurements: Height: '5\' 3"'$  (160 cm) Weight: 122.5 kg (270 lb 1 oz) IBW/kg (Calculated) : 52.4 Heparin Dosing Weight: 85 kg  Vital Signs: Temp: 98.5 F (36.9 C) (08/21 0746) Temp Source: Oral (08/21 0746) BP: 149/97 (08/21 0814) Pulse Rate: 75 (08/21 0814)  Labs: Recent Labs    11/25/20 0602 11/25/20 0802 11/25/20 1431 11/25/20 2315 11/26/20 0326  HGB 13.3  --   --   --  12.7  HCT 40.2  --   --   --  39.1  PLT 260  --   --   --  255  HEPARINUNFRC  --   --  0.23* 0.45 0.40  CREATININE 1.96*  --   --   --  2.17*  TROPONINIHS 422* 791*  --   --   --      Estimated Creatinine Clearance: 36.3 mL/min (A) (by C-G formula based on SCr of 2.17 mg/dL (H)).   Medical History: Past Medical History:  Diagnosis Date   Bipolar disorder ([place])    Cancer ([place])    COPD (chronic obstructive pulmonary disease) ([place])    Coronary artery disease    Diverticulitis    Hypertension    Myocardial infarction ([place])    Seizure ([place])    Stroke [facility])     Assessment: Ms. Feustel is a 55 y.o. F presenting with CP. Plan is for heart cath this week. Pharmacy was asked to dose heparin for ACS. No anticoagulation noted PTA.   Heparin level today is 0.40 and therapeutic on 1350 units/h. CBC is stable and within normal limits. Platelets are normal and within normal limits. No signs of bleeding or IV site issues per RN.   Goal of Therapy:  Heparin level 0.3-0.7 units/ml Monitor platelets by anticoagulation protocol: Yes   Plan:  Continue IV heparin gtt at 1350 units/h Daily heparin level and CBC Monitor for signs and symptoms of bleeding  Thank you for involving pharmacy in this patient's care.  Elita Quick, PharmD PGY1 Ambulatory Care Pharmacy Resident 11/26/2020 10:31 AM  **Pharmacist phone directory can be found on [email or web address] listed under [place]**

## 2020-11-26 NOTE — H&P (View-Only) (Signed)
Ref: Pcp, No   Subjective:  Awake. No chest pain. Monitor: Sinus rhythm. Creatinine slightly up without lasix use. Echocardiogram with moderate to sever LVH and mild LV systolic dysfunction. EF 50-55 %  Objective:  Vital Signs in the last 24 hours: Temp:  [97.5 F (36.4 C)-98.7 F (37.1 C)] 98.5 F (36.9 C) (08/21 0746) Pulse Rate:  [61-103] 75 (08/21 0814) Cardiac Rhythm: Normal sinus rhythm (08/21 0746) Resp:  [15-22] 15 (08/21 0746) BP: (107-175)/(71-115) 149/97 (08/21 0814) SpO2:  [97 %-100 %] 100 % (08/20 1918) Weight:  [121.3 kg-122.5 kg] 122.5 kg (08/21 0500)  Physical Exam: BP Readings from Last 1 Encounters:  11/26/20 (!) 149/97     Wt Readings from Last 1 Encounters:  11/26/20 122.5 kg    Weight change: -7.938 kg Body mass index is 47.84 kg/m. HEENT: [place]/AT, Eyes-Brown, Conjunctiva-Pink, Sclera-Non-icteric Neck: No JVD, No bruit, Trachea midline. Lungs:  Clear, Bilateral. Cardiac:  Regular rhythm, normal S1 and S2, no S3. II/VI systolic murmur. Abdomen:  Soft, non-tender. BS present. Extremities:  No edema present. No cyanosis. No clubbing. CNS: AxOx3, Cranial nerves grossly intact, moves all 4 extremities.  Skin: Warm and dry.   Intake/Output from previous day: 08/20 0701 - 08/21 0700 In: 605.6 [P.O.:240; I.V.:315.6; IV Piggyback:50] Out: -     Lab Results: BMET    Component Value Date/Time   NA 135 11/26/2020 0326   NA 138 11/25/2020 0602   NA 139 04/05/2020 0242   K 4.5 11/26/2020 0326   K 4.3 11/25/2020 0602   K 4.0 04/05/2020 0242   CL 109 11/26/2020 0326   CL 112 (H) 11/25/2020 0602   CL 108 04/05/2020 0242   CO2 18 (L) 11/26/2020 0326   CO2 17 (L) 11/25/2020 0602   CO2 20 (L) 04/05/2020 0242   GLUCOSE 94 11/26/2020 0326   GLUCOSE 113 (H) 11/25/2020 0602   GLUCOSE 100 (H) 04/05/2020 0242   BUN 16 11/26/2020 0326   BUN 12 11/25/2020 0602   BUN 13 04/05/2020 0242   CREATININE 2.17 (H) 11/26/2020 0326   CREATININE 1.96 (H) 11/25/2020  0602   CREATININE 1.88 (H) 04/05/2020 0242   CALCIUM 8.4 (L) 11/26/2020 0326   CALCIUM 8.6 (L) 11/25/2020 0602   CALCIUM 8.6 (L) 04/05/2020 0242   GFRNONAA 26 (L) 11/26/2020 0326   GFRNONAA 29 (L) 11/25/2020 0602   GFRNONAA 31 (L) 04/05/2020 0242   CBC    Component Value Date/Time   WBC 10.3 11/26/2020 0326   RBC 4.66 11/26/2020 0326   HGB 12.7 11/26/2020 0326   HCT 39.1 11/26/2020 0326   PLT 255 11/26/2020 0326   MCV 83.9 11/26/2020 0326   MCH 27.3 11/26/2020 0326   MCHC 32.5 11/26/2020 0326   RDW 14.6 11/26/2020 0326   HEPATIC Function Panel No results for input(s): PROT in the last 8760 hours.  Invalid input(s):  ALBUMIN,  AST,  ALT,  ALKPHOS,  BILIDIR,  IBILI HEMOGLOBIN A1C No components found for: HGA1C,  MPG CARDIAC ENZYMES No results found for: CKTOTAL, CKMB, CKMBINDEX, TROPONINI BNP No results for input(s): PROBNP in the last 8760 hours. TSH No results for input(s): TSH in the last 8760 hours. CHOLESTEROL Recent Labs    04/05/20 0243 11/26/20 0326  CHOL 218* 183    Scheduled Meds:  allopurinol  100 mg Oral Daily   aspirin EC  81 mg Oral Daily   atorvastatin  80 mg Oral QHS   carvedilol  12.5 mg Oral BID WC   isosorbide  mononitrate  60 mg Oral Daily   ranolazine  1,000 mg Oral BID   sodium chloride flush  3 mL Intravenous Q12H   Continuous Infusions:  sodium chloride     sodium chloride     heparin 1,350 Units/hr (11/26/20 0444)   PRN Meds:.sodium chloride, acetaminophen, albuterol, nitroGLYCERIN, ondansetron (ZOFRAN) IV, sodium chloride flush, zolpidem  Assessment/Plan:  NSTEMI, acute CKD IIIb CAD S/P coronary stents Morbid obesity HTN, improving control Tobacco use disorder  Plan: IV fluids. Possible cardiac cath on Monday or Tuesday.   LOS: 1 day   Time spent including chart review, lab review, examination, discussion with patient/Nurse/Pharmacy : 35 min   Dixie Dials  MD  11/26/2020, 10:42 AM

## 2020-11-26 NOTE — Progress Notes (Signed)
Date and time results received: 11/26/20 1300 (use smartphrase ".now" to insert current time)  Test: troponin Critical Value: [id number]  Name of Provider Notified: Dr. Doylene Canard   Orders Received? Or Actions Taken?:  No ST elevation noted on EKG.  Continue IV fluid and nitro SL as needed.  If renal function was fine tomorrow am, will proceed for  cardiac cath.

## 2020-11-27 ENCOUNTER — Encounter (HOSPITAL_COMMUNITY)
Admission: EM | Disposition: A | Discharge status: Home / Self Care | Payer: Self-pay | Source: Home / Self Care | Attending: Cardiovascular Disease

## 2020-11-27 HISTORY — PX: LEFT HEART CATH AND CORONARY ANGIOGRAPHY: CATH118249

## 2020-11-27 LAB — BASIC METABOLIC PANEL
Anion gap: 7 (ref 5–15)
BUN: 18 mg/dL (ref 6–20)
CO2: 18 mmol/L — ABNORMAL LOW (ref 22–32)
Calcium: 8.3 mg/dL — ABNORMAL LOW (ref 8.9–10.3)
Chloride: 112 mmol/L — ABNORMAL HIGH (ref 98–111)
Creatinine, Ser: 2.22 mg/dL — ABNORMAL HIGH (ref 0.44–1.00)
GFR, Estimated: 25 mL/min — ABNORMAL LOW (ref >60)
Glucose, Bld: 92 mg/dL (ref 70–99)
Potassium: 4.4 mmol/L (ref 3.5–5.1)
Sodium: 137 mmol/L (ref 135–145)

## 2020-11-27 LAB — CBC
HCT: 36.7 % (ref 36.0–46.0)
HCT: 43.6 % (ref 36.0–46.0)
Hemoglobin: 12.1 g/dL (ref 12.0–15.0)
Hemoglobin: 14.3 g/dL (ref 12.0–15.0)
MCH: 27.8 pg (ref 26.0–34.0)
MCH: 27.3 pg (ref 26.0–34.0)
MCHC: 33 g/dL (ref 30.0–36.0)
MCHC: 32.8 g/dL (ref 30.0–36.0)
MCV: 84.2 fL (ref 80.0–100.0)
MCV: 83.4 fL (ref 80.0–100.0)
Platelets: 247 10*3/uL (ref 150–400)
Platelets: 165 10*3/uL (ref 150–400)
RBC: 4.36 MIL/uL (ref 3.87–5.11)
RBC: 5.23 MIL/uL — ABNORMAL HIGH (ref 3.87–5.11)
RDW: 14.6 % (ref 11.5–15.5)
RDW: 14.6 % (ref 11.5–15.5)
WBC: 9.4 10*3/uL (ref 4.0–10.5)
WBC: 5.7 10*3/uL (ref 4.0–10.5)
nRBC: 0 % (ref 0.0–0.2)
nRBC: 0 % (ref 0.0–0.2)

## 2020-11-27 LAB — HEPARIN LEVEL (UNFRACTIONATED): Heparin Unfractionated: 0.33 IU/mL (ref 0.30–0.70)

## 2020-11-27 LAB — CREATININE, SERUM
Creatinine, Ser: 2.25 mg/dL — ABNORMAL HIGH (ref 0.44–1.00)
GFR, Estimated: 25 mL/min — ABNORMAL LOW (ref >60)

## 2020-11-27 LAB — CARDIAC CATHETERIZATION: Cath EF Quantitative: 60 %

## 2020-11-27 SURGERY — LEFT HEART CATH AND CORONARY ANGIOGRAPHY
Anesthesia: LOCAL

## 2020-11-27 MED ORDER — LABETALOL HCL 5 MG/ML IV SOLN: 10.0000 mg | INTRAVENOUS | Status: AC | PRN

## 2020-11-27 MED ORDER — HEPARIN SODIUM (PORCINE) 5000 UNIT/ML IJ SOLN
5000.0000 [IU] | Freq: Three times a day (TID) | INTRAMUSCULAR | Status: DC
  Administered 2020-11-28 – 2020-11-29 (×3): 5000 [IU] via SUBCUTANEOUS
  Filled 2020-11-27 (×3): qty 1

## 2020-11-27 MED ORDER — MORPHINE SULFATE (PF) 2 MG/ML IV SOLN
2.0000 mg | Freq: Once | INTRAVENOUS | Status: AC
  Administered 2020-11-27: 2 mg via INTRAVENOUS
  Filled 2020-11-27: qty 1

## 2020-11-27 MED ORDER — FENTANYL CITRATE (PF) 100 MCG/2ML IJ SOLN
INTRAMUSCULAR | Status: DC | PRN
  Administered 2020-11-27: 25 ug via INTRAVENOUS

## 2020-11-27 MED ORDER — HYDRALAZINE HCL 20 MG/ML IJ SOLN: 10.0000 mg | INTRAMUSCULAR | Status: AC | PRN

## 2020-11-27 MED ORDER — CLOPIDOGREL BISULFATE 75 MG PO TABS
300.0000 mg | ORAL_TABLET | Freq: Once | ORAL | Status: AC
  Administered 2020-11-27: 300 mg via ORAL
  Filled 2020-11-27: qty 4

## 2020-11-27 MED ORDER — IOHEXOL 350 MG/ML SOLN
INTRAVENOUS | Status: DC | PRN
  Administered 2020-11-27: 115 mL

## 2020-11-27 MED ORDER — SODIUM CHLORIDE 0.9 % IV SOLN: 250.0000 mL | INTRAVENOUS | Status: DC | PRN

## 2020-11-27 MED ORDER — VERAPAMIL HCL 2.5 MG/ML IV SOLN
INTRAVENOUS | Status: DC | PRN
  Administered 2020-11-27: 10 mL via INTRA_ARTERIAL

## 2020-11-27 MED ORDER — HEPARIN (PORCINE) IN NACL 1000-0.9 UT/500ML-% IV SOLN
INTRAVENOUS | Status: AC
  Filled 2020-11-27: qty 500

## 2020-11-27 MED ORDER — MIDAZOLAM HCL 2 MG/2ML IJ SOLN
INTRAMUSCULAR | Status: DC | PRN
  Administered 2020-11-27: 1 mg via INTRAVENOUS

## 2020-11-27 MED ORDER — HEPARIN SODIUM (PORCINE) 5000 UNIT/ML IJ SOLN: 5000.0000 [IU] | Freq: Three times a day (TID) | INTRAMUSCULAR | Status: DC

## 2020-11-27 MED ORDER — LIDOCAINE HCL (PF) 1 % IJ SOLN
INTRAMUSCULAR | Status: AC
  Filled 2020-11-27: qty 30

## 2020-11-27 MED ORDER — VERAPAMIL HCL 2.5 MG/ML IV SOLN
INTRAVENOUS | Status: AC
  Filled 2020-11-27: qty 2

## 2020-11-27 MED ORDER — HEPARIN (PORCINE) IN NACL 1000-0.9 UT/500ML-% IV SOLN
INTRAVENOUS | Status: DC | PRN
  Administered 2020-11-27 (×2): 500 mL

## 2020-11-27 MED ORDER — LIDOCAINE HCL (PF) 1 % IJ SOLN
INTRAMUSCULAR | Status: DC | PRN
  Administered 2020-11-27: 2 mL

## 2020-11-27 MED ORDER — MIDAZOLAM HCL 2 MG/2ML IJ SOLN
INTRAMUSCULAR | Status: AC
  Filled 2020-11-27: qty 2

## 2020-11-27 MED ORDER — CLOPIDOGREL BISULFATE 75 MG PO TABS
75.0000 mg | ORAL_TABLET | Freq: Every day | ORAL | Status: DC
  Administered 2020-11-28: 75 mg via ORAL
  Filled 2020-11-27 (×2): qty 1

## 2020-11-27 MED ORDER — MORPHINE SULFATE (PF) 2 MG/ML IV SOLN
2.0000 mg | Freq: Once | INTRAVENOUS | Status: AC
Start: 2020-11-27 — End: 2020-11-27
  Administered 2020-11-27: 2 mg via INTRAVENOUS
  Filled 2020-11-27: qty 1

## 2020-11-27 MED ORDER — HEPARIN SODIUM (PORCINE) 1000 UNIT/ML IJ SOLN
INTRAMUSCULAR | Status: DC | PRN
  Administered 2020-11-27: 6000 [IU] via INTRAVENOUS

## 2020-11-27 MED ORDER — HEPARIN SODIUM (PORCINE) 1000 UNIT/ML IJ SOLN
INTRAMUSCULAR | Status: AC
  Filled 2020-11-27: qty 1

## 2020-11-27 MED ORDER — SODIUM CHLORIDE 0.9% FLUSH: 3.0000 mL | INTRAVENOUS | Status: DC | PRN

## 2020-11-27 MED ORDER — SODIUM CHLORIDE 0.9 % IV SOLN: INTRAVENOUS | Status: AC

## 2020-11-27 MED ORDER — FENTANYL CITRATE (PF) 100 MCG/2ML IJ SOLN
INTRAMUSCULAR | Status: AC
  Filled 2020-11-27: qty 2

## 2020-11-27 MED ORDER — SODIUM CHLORIDE 0.9% FLUSH
3.0000 mL | Freq: Two times a day (BID) | INTRAVENOUS | Status: DC
  Administered 2020-11-28: 3 mL via INTRAVENOUS

## 2020-11-27 SURGICAL SUPPLY — 11 items
CATH 5FR JL3.5 JR4 ANG PIG MP (CATHETERS) ×2 IMPLANT
CATH INFINITI 5 FR 3DRC (CATHETERS) ×2 IMPLANT
CATH INFINITI 5FR AL1 (CATHETERS) ×4 IMPLANT
DEVICE RAD COMP TR BAND LRG (VASCULAR PRODUCTS) ×2 IMPLANT
GLIDESHEATH SLEND SS 6F .021 (SHEATH) ×2 IMPLANT
GUIDEWIRE INQWIRE 1.5J.035X260 (WIRE) ×1 IMPLANT
INQWIRE 1.5J .035X260CM (WIRE) ×2
KIT HEART LEFT (KITS) ×2 IMPLANT
PACK CARDIAC CATHETERIZATION (CUSTOM PROCEDURE TRAY) ×2 IMPLANT
SYR MEDRAD MARK 7 150ML (SYRINGE) ×2 IMPLANT
TRANSDUCER W/STOPCOCK (MISCELLANEOUS) ×2 IMPLANT

## 2020-11-27 NOTE — Progress Notes (Signed)
Contacted Dr. Doylene Canard during day shift on multiple occasions, before pt's heart cath, concerning pt having chest pain. With Dr. Merrilee Jansky verbal direction Nitroglycerin was administered and morphine was administered.

## 2020-11-27 NOTE — Care Management (Signed)
11-27-20 [id number] Case Manager received a consult for medications and primary care provider. Case Manager spoke with the patient and she states that she is visiting from [place] and has not decided if she will return to [place] or make [place] home. Patient states she used [facility] in [place], she may be able to get her medications filled since she is visiting at the [place] in [place]. Case Manager did make the patient aware that if she decides to stay, she will need to contact the Department of Social Services to see if she will qualify for [facility] Medicaid. Patient is aware that if she stays in [place], to call and schedule a hospital follow up appointment at the [facility]. Information has been placed on the AVS, no further needs identified at this time.

## 2020-11-27 NOTE — Interval H&P Note (Signed)
History and Physical Interval Note:  11/27/2020 3:58 PM  Colleen Curtis  has presented today for surgery, with the diagnosis of NSTEMI.  The various methods of treatment have been discussed with the patient and family. After consideration of risks, benefits and other options for treatment, the patient has consented to  Procedure(s): LEFT HEART CATH AND CORONARY ANGIOGRAPHY (N/A) as a surgical intervention.  The patient's history has been reviewed, patient examined, no change in status, stable for surgery.  I have reviewed the patient's chart and labs.  Questions were answered to the patient's satisfaction.     Birdie Riddle

## 2020-11-27 NOTE — Progress Notes (Signed)
ANTICOAGULATION CONSULT NOTE - Follow-up Consult  Pharmacy Consult for Heparin Indication: chest pain/ACS  No Known Allergies  Patient Measurements: Height: '5\' 3"'$  (160 cm) Weight: 122.5 kg (270 lb 1 oz) IBW/kg (Calculated) : 52.4 Heparin Dosing Weight: 85 kg  Vital Signs: Temp: 98.4 F (36.9 C) (08/22 1323) Temp Source: Oral (08/22 1323) BP: 121/77 (08/22 1323) Pulse Rate: 64 (08/22 1323)  Labs: Recent Labs    11/25/20 0602 11/25/20 0802 11/25/20 1431 11/25/20 2315 11/26/20 0326 11/26/20 1126 11/27/20 0222 11/27/20 0809  HGB 13.3  --   --   --  12.7  --  12.1  --   HCT 40.2  --   --   --  39.1  --  36.7  --   PLT 260  --   --   --  255  --  247  --   HEPARINUNFRC  --   --    < > 0.45 0.40  --  0.33  --   CREATININE 1.96*  --   --   --  2.17*  --   --  2.22*  TROPONINIHS 422* 791*  --   --   --  5,967*  --   --    < > = values in this interval not displayed.     Estimated Creatinine Clearance: 35.5 mL/min (A) (by C-G formula based on SCr of 2.22 mg/dL (H)).   Medical History: Past Medical History:  Diagnosis Date   Bipolar disorder ([place])    Cancer ([place])    COPD (chronic obstructive pulmonary disease) ([place])    Coronary artery disease    Diverticulitis    Hypertension    Myocardial infarction ([place])    Seizure ([place])    Stroke [facility])     Assessment: Ms. Clyburn is a 55 y.o. F presenting with CP. Plan is for heart cath this week. Pharmacy was asked to dose heparin for ACS. No anticoagulation noted PTA.   Heparin level today is 0.33 and therapeutic on 1350 units/h. CBC is stable and within normal limits. Platelets are normal and within normal limits. No signs of bleeding or IV site issues per RN.   Goal of Therapy:  Heparin level 0.3-0.7 units/ml Monitor platelets by anticoagulation protocol: Yes   Plan:  Continue IV heparin gtt at 1350 units/h Daily heparin level and CBC Monitor for signs and symptoms of bleeding  F/u plans for anticoagulation after cath  today.  Thank you for involving pharmacy in this patient's care.  [place] Crane, Roylene Reason, BCCP Clinical Pharmacist  11/27/2020 1:42 PM   [facility] pharmacy phone numbers are listed on [email or web address]

## 2020-11-28 ENCOUNTER — Encounter (HOSPITAL_COMMUNITY): Payer: Self-pay | Admitting: Cardiovascular Disease

## 2020-11-28 LAB — CBC
HCT: 35.1 % — ABNORMAL LOW (ref 36.0–46.0)
Hemoglobin: 11.4 g/dL — ABNORMAL LOW (ref 12.0–15.0)
MCH: 27.1 pg (ref 26.0–34.0)
MCHC: 32.5 g/dL (ref 30.0–36.0)
MCV: 83.4 fL (ref 80.0–100.0)
Platelets: 227 10*3/uL (ref 150–400)
RBC: 4.21 MIL/uL (ref 3.87–5.11)
RDW: 14.6 % (ref 11.5–15.5)
WBC: 8.4 10*3/uL (ref 4.0–10.5)
nRBC: 0 % (ref 0.0–0.2)

## 2020-11-28 LAB — BASIC METABOLIC PANEL
Anion gap: 11 (ref 5–15)
BUN: 21 mg/dL — ABNORMAL HIGH (ref 6–20)
CO2: 15 mmol/L — ABNORMAL LOW (ref 22–32)
Calcium: 8.6 mg/dL — ABNORMAL LOW (ref 8.9–10.3)
Chloride: 110 mmol/L (ref 98–111)
Creatinine, Ser: 2.38 mg/dL — ABNORMAL HIGH (ref 0.44–1.00)
GFR, Estimated: 23 mL/min — ABNORMAL LOW (ref >60)
Glucose, Bld: 76 mg/dL (ref 70–99)
Potassium: 5.7 mmol/L — ABNORMAL HIGH (ref 3.5–5.1)
Sodium: 136 mmol/L (ref 135–145)

## 2020-11-28 MED ORDER — SODIUM ZIRCONIUM CYCLOSILICATE 10 G PO PACK
10.0000 g | PACK | Freq: Once | ORAL | Status: AC
  Administered 2020-11-28: 10 g via ORAL
  Filled 2020-11-28: qty 1

## 2020-11-28 NOTE — Progress Notes (Signed)
Ref: Pcp, No   Subjective:  Awake. VS stable. Mild deterioration of renal function post cardiac cath. Positive hyperkalemia. On Aspirin, Clopidogrel and isosorbide for multivessel CAD. Needs to ambulate.  Objective:  Vital Signs in the last 24 hours: Temp:  [98.1 F (36.7 C)-98.7 F (37.1 C)] 98.7 F (37.1 C) (08/23 2056) Pulse Rate:  [64-88] 68 (08/23 2056) Cardiac Rhythm: Normal sinus rhythm (08/23 1900) Resp:  [16-20] 19 (08/23 2056) BP: (122-143)/(75-96) 127/78 (08/23 2056) SpO2:  [98 %-100 %] 98 % (08/23 1245) Weight:  [121.1 kg] 121.1 kg (08/23 0406)  Physical Exam: BP Readings from Last 1 Encounters:  11/28/20 127/78     Wt Readings from Last 1 Encounters:  11/28/20 121.1 kg    Weight change: -1.435 kg Body mass index is 47.28 kg/m. HEENT: [place]/AT, Eyes-Brown, Conjunctiva-Pink, Sclera-Non-icteric Neck: No JVD, No bruit, Trachea midline. Lungs:  Clear, Bilateral. Cardiac:  Regular rhythm, normal S1 and S2, no S3. II/VI systolic murmur. Abdomen:  Soft, non-tender. BS present. Extremities:  No edema present. No cyanosis. No clubbing. Right radial site is stable. CNS: AxOx3, Cranial nerves grossly intact, moves all 4 extremities.  Skin: Warm and dry.   Intake/Output from previous day: 08/22 0701 - 08/23 0700 In: 3312.4 [P.O.:600; I.V.:2712.4] Out: 1250 [Urine:1250]    Lab Results: BMET    Component Value Date/Time   NA 136 11/28/2020 0141   NA 137 11/27/2020 0809   NA 135 11/26/2020 0326   K 5.7 (H) 11/28/2020 0141   K 4.4 11/27/2020 0809   K 4.5 11/26/2020 0326   CL 110 11/28/2020 0141   CL 112 (H) 11/27/2020 0809   CL 109 11/26/2020 0326   CO2 15 (L) 11/28/2020 0141   CO2 18 (L) 11/27/2020 0809   CO2 18 (L) 11/26/2020 0326   GLUCOSE 76 11/28/2020 0141   GLUCOSE 92 11/27/2020 0809   GLUCOSE 94 11/26/2020 0326   BUN 21 (H) 11/28/2020 0141   BUN 18 11/27/2020 0809   BUN 16 11/26/2020 0326   CREATININE 2.38 (H) 11/28/2020 0141   CREATININE 2.25 (H)  11/27/2020 1800   CREATININE 2.22 (H) 11/27/2020 0809   CALCIUM 8.6 (L) 11/28/2020 0141   CALCIUM 8.3 (L) 11/27/2020 0809   CALCIUM 8.4 (L) 11/26/2020 0326   GFRNONAA 23 (L) 11/28/2020 0141   GFRNONAA 25 (L) 11/27/2020 1800   GFRNONAA 25 (L) 11/27/2020 0809   CBC    Component Value Date/Time   WBC 8.4 11/28/2020 0141   RBC 4.21 11/28/2020 0141   HGB 11.4 (L) 11/28/2020 0141   HCT 35.1 (L) 11/28/2020 0141   PLT 227 11/28/2020 0141   MCV 83.4 11/28/2020 0141   MCH 27.1 11/28/2020 0141   MCHC 32.5 11/28/2020 0141   RDW 14.6 11/28/2020 0141   HEPATIC Function Panel No results for input(s): PROT in the last 8760 hours.  Invalid input(s):  ALBUMIN,  AST,  ALT,  ALKPHOS,  BILIDIR,  IBILI HEMOGLOBIN A1C No components found for: HGA1C,  MPG CARDIAC ENZYMES No results found for: CKTOTAL, CKMB, CKMBINDEX, TROPONINI BNP No results for input(s): PROBNP in the last 8760 hours. TSH No results for input(s): TSH in the last 8760 hours. CHOLESTEROL Recent Labs    04/05/20 0243 11/26/20 0326  CHOL 218* 183    Scheduled Meds:  allopurinol  100 mg Oral Daily   amLODipine  5 mg Oral Daily   aspirin EC  81 mg Oral Daily   atorvastatin  80 mg Oral QHS   carvedilol  12.5 mg Oral BID WC   clopidogrel  75 mg Oral Daily   heparin  5,000 Units Subcutaneous Q8H   isosorbide mononitrate  60 mg Oral Daily   ranolazine  1,000 mg Oral BID   sodium chloride flush  3 mL Intravenous Q12H   Continuous Infusions:  sodium chloride     PRN Meds:.sodium chloride, acetaminophen, albuterol, nitroGLYCERIN, ondansetron (ZOFRAN) IV, sodium chloride flush, zolpidem  Assessment/Plan:  Acute NSTEMI Multivessel CAD In stent restenosis of LCx/OM coronary artery. CKD, IIIb Hyperkalemia Morbid obesity HTN, controlled Tobacco use disorder  Plan: Increase activity. Use Lokelma. Continue Aspirin, Atorvastatin, Plavix and Isosorbide   LOS: 3 days   Time spent including chart review, lab review,  examination, discussion with patient/Nurse : 30 min   Dixie Dials  MD  11/28/2020, 10:52 PM

## 2020-11-29 ENCOUNTER — Other Ambulatory Visit (HOSPITAL_COMMUNITY): Payer: Self-pay

## 2020-11-29 LAB — BASIC METABOLIC PANEL
Anion gap: 10 (ref 5–15)
BUN: 21 mg/dL — ABNORMAL HIGH (ref 6–20)
CO2: 17 mmol/L — ABNORMAL LOW (ref 22–32)
Calcium: 8.8 mg/dL — ABNORMAL LOW (ref 8.9–10.3)
Chloride: 108 mmol/L (ref 98–111)
Creatinine, Ser: 2.49 mg/dL — ABNORMAL HIGH (ref 0.44–1.00)
GFR, Estimated: 22 mL/min — ABNORMAL LOW (ref >60)
Glucose, Bld: 87 mg/dL (ref 70–99)
Potassium: 4.4 mmol/L (ref 3.5–5.1)
Sodium: 135 mmol/L (ref 135–145)

## 2020-11-29 MED ORDER — AMLODIPINE BESYLATE 5 MG PO TABS
5.0000 mg | ORAL_TABLET | Freq: Every day | ORAL | 1 refills | Status: DC
  Filled 2020-11-29: qty 30, 30d supply, fill #0

## 2020-11-29 MED ORDER — CARVEDILOL 12.5 MG PO TABS
12.5000 mg | ORAL_TABLET | Freq: Two times a day (BID) | ORAL | 1 refills | Status: DC
Start: 2020-11-29 — End: 2022-06-07
  Filled 2020-11-29: qty 60, 30d supply, fill #0

## 2020-11-29 MED ORDER — ISOSORBIDE MONONITRATE ER 60 MG PO TB24
60.0000 mg | ORAL_TABLET | Freq: Every day | ORAL | 1 refills | Status: DC
  Filled 2020-11-29: qty 30, 30d supply, fill #0

## 2020-11-29 MED ORDER — ATORVASTATIN CALCIUM 80 MG PO TABS
80.0000 mg | ORAL_TABLET | Freq: Every day | ORAL | 3 refills | Status: DC
Start: 2020-11-29 — End: 2022-07-24
  Filled 2020-11-29: qty 30, 30d supply, fill #0

## 2020-11-29 MED ORDER — ALLOPURINOL 100 MG PO TABS
100.0000 mg | ORAL_TABLET | Freq: Every day | ORAL | 1 refills | Status: DC
  Filled 2020-11-29: qty 30, 30d supply, fill #0

## 2020-11-29 MED ORDER — ASPIRIN 81 MG PO TBEC
81.0000 mg | DELAYED_RELEASE_TABLET | Freq: Every day | ORAL | 11 refills | Status: DC
  Filled 2020-11-29: qty 30, 30d supply, fill #0

## 2020-11-29 MED ORDER — CLOPIDOGREL BISULFATE 75 MG PO TABS
75.0000 mg | ORAL_TABLET | Freq: Every day | ORAL | 1 refills | Status: DC
  Filled 2020-11-29: qty 30, 30d supply, fill #0

## 2020-11-29 MED ORDER — RANOLAZINE ER 1000 MG PO TB12
1000.0000 mg | ORAL_TABLET | Freq: Two times a day (BID) | ORAL | 1 refills | Status: DC
  Filled 2020-11-29: qty 60, 30d supply, fill #0

## 2020-11-29 MED ORDER — ACETAMINOPHEN 325 MG PO TABS: 650.0000 mg | ORAL_TABLET | ORAL | Status: AC | PRN

## 2020-11-29 NOTE — TOC Transition Note (Signed)
Transition of Care [facility]) - CM/SW Discharge Note   Patient Details  Name: Colleen Curtis MRN: [id number] Date of Birth: [birth year]  Transition [facility]) CM/SW Contact:  Trula Ore, [place] Phone Number: 11/29/2020, 3:16 PM   Clinical Narrative:     Patient will DC to: home  Anticipated DC date: 11/29/2020  Family notified: Patient declined   Transport by: [employer]  ?  Per MD patient ready for DC to home . RN and patient, notified of DC.  [place] taxi requested for patient.  CSW signing off.   Final next level of care: Home/Self Care Barriers to Discharge: No Barriers Identified   Patient Goals and CMS Choice Patient states their goals for this hospitalization and ongoing recovery are:: to go home CMS Medicare.gov Compare Post Acute Care list provided to:: Patient Choice offered to / list presented to : Patient  Discharge Placement                Patient to be transferred to facility by: [facility] Name of family member notified: patient declined Patient and family notified of of transfer: 11/29/20  Discharge Plan and Services                                     Social Determinants of Health (SDOH) Interventions     Readmission Risk Interventions No flowsheet data found.

## 2020-11-29 NOTE — Discharge Summary (Signed)
Physician Discharge Summary  Patient ID: Colleen Curtis MRN: [id number] DOB/AGE: 57-Oct-1967 57 y.o.  Admit date: 11/25/2020 Discharge date: 11/29/2020  Admission Diagnoses: Acute coronary syndrome CAD S/P coronary stents Morbid obesity Hypertension, uncontrolled Tobacco use disorder  Discharge Diagnoses:  Principle Diagnosis: Acute NSTEMI Active Problems:   Multivessel CAD   In stent restenosis of LCx/OM coronary artery   CKD, IIIb   Hyperkalemia, resolved   Morbid obesity   Hypertension, essential   Tobacco use disorder   Morbid obesity   S/P DVT   H/O seizures  Discharged Condition: fair  Hospital Course: 55 years old female with PMH of CAD, (S/P  Proximal LAD stent, RCA stent and LCx/OM stent in stent with stent jail for mid and distal LCx), uncontrolled HTN, Morbid Obesity, Stroke, Seizures and 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 She responded to aspirin and SL NTG with resolution of chest pain. EKG was non-diagnostic for MI but Troponin I level was as high as 5987 ng.  She underwent cardiac cath after 48 hours of IV saline hydration and control of blood pressure with medications (as she was out of medications for several days). She had multivessel disease with in stent 50 % stenosis and stent jailed mid and distal LCx had 80 % stenosis. Over all picture with renal dysfunction and medication and dietary non-compliance medical therapy with Aspirin and Plavix was considered for her. Her right radial cath site remained without pain or swelling and with normal pulse.  She will follow with me in 1 week and primary care in 2 weeks. She was advised to take medications regularly, follow heart healthy diet and increase activity to lose weight.  Consults: cardiology  Significant Diagnostic Studies: labs: CBC was normal on admission. BMET was remarkable for Creatinine of 1.96 to 2.49.  Troponin was as high as 5,967 ng.  EKG showed SR with old ASWMI.  Echocardiogram showed severe LVH and low normal EF of 50 to 55 %, most likely from uncontrolled HTN for several months or years.  Cardiac cath showed multivessel CAD with moderate double stent restenosis of LCx/OM and severe disease of jailed mid and distal LCx. LAD and RCA stents were patent with mild disease.  Treatments: IV hydration, cardiac meds: Atorvastatin, Isosorbide, carvedilol and amlodipine, and anticoagulation: ASA and Plavix.  Discharge Exam: Blood pressure (!) 169/102, pulse 69, temperature 98.4 F (36.9 C), temperature source Oral, resp. rate 17, height '5\' 3"'$  (1.6 m), weight 120.6 kg, SpO2 98 %. General appearance: alert, cooperative and appears stated age. Head: Normocephalic, atraumatic. Eyes: Brown eyes, pink conjunctiva, corneas clear.  Neck: No adenopathy, no carotid bruit, no JVD, supple, symmetrical, trachea midline and thyroid not enlarged. Resp: Clear to auscultation bilaterally. Cardio: Regular rate and rhythm, S1, S2 normal, II/VI systolic murmur, no click, rub or gallop. GI: Soft, non-tender; bowel sounds normal; no organomegaly. Extremities: No edema, cyanosis or clubbing. Right radial cath site is normal. Skin: Warm and dry.  Neurologic: Alert and oriented X 3, normal strength and tone. Normal coordination and gait.  Disposition: Discharge disposition: 01-Home or Self Care        Allergies as of 11/29/2020   No Known Allergies      Medication List     STOP taking these medications    zolpidem 10 MG tablet Commonly known as: AMBIEN       TAKE these medications    acetaminophen 325  MG tablet Commonly known as: TYLENOL Take 2 tablets (650 mg total) by mouth every 4 (four) hours as needed for headache or mild pain.   albuterol 108 (90 Base) MCG/ACT inhaler Commonly known as: VENTOLIN HFA Inhale 1 puff into the lungs daily as needed for wheezing or shortness of breath.    allopurinol 100 MG tablet Commonly known as: ZYLOPRIM Take 1 tablet (100 mg total) by mouth daily.   amLODipine 5 MG tablet Commonly known as: NORVASC Take 1 tablet (5 mg total) by mouth daily.   aspirin 81 MG EC tablet Take 1 tablet (81 mg total) by mouth daily. Swallow whole.   atorvastatin 40 MG tablet Commonly known as: LIPITOR Take 2 tablets (80 mg total) by mouth at bedtime.   carvedilol 12.5 MG tablet Commonly known as: COREG Take 1 tablet (12.5 mg total) by mouth 2 (two) times daily with a meal.   clopidogrel 75 MG tablet Commonly known as: PLAVIX Take 1 tablet (75 mg total) by mouth daily.   isosorbide mononitrate 60 MG 24 hr tablet Commonly known as: IMDUR Take 1 tablet (60 mg total) by mouth daily.   nitroGLYCERIN 0.4 MG SL tablet Commonly known as: NITROSTAT Place 0.4 mg under the tongue every 5 (five) minutes as needed for chest pain.   ranolazine 1000 MG SR tablet Commonly known as: RANEXA Take 1 tablet (1,000 mg total) by mouth in the morning and at bedtime.        Follow-up Information     [facility] Follow up.   Why: Please call this location to schedule a hospital follow up appointment- if you decide to stay in [place]. Contact information: [place] [phone]        Dixie Dials, MD Follow up in 1 week(s).   Specialty: Cardiology Contact information: [place] [address] [phone]                 Time spent: Review of old chart, current chart, lab, x-ray, cardiac tests and discussion with patient over 60 minutes.  Signed: Birdie Riddle 11/29/2020, 1:38 PM

## 2020-12-14 ENCOUNTER — Telehealth (HOSPITAL_COMMUNITY): Payer: Self-pay | Admitting: Pharmacist

## 2020-12-14 NOTE — Telephone Encounter (Signed)
Attempted to reach patient, VM not set up.

## 2021-01-11 ENCOUNTER — Ambulatory Visit (INDEPENDENT_AMBULATORY_CARE_PROVIDER_SITE_OTHER): Payer: Self-pay | Admitting: Podiatry

## 2021-01-11 ENCOUNTER — Other Ambulatory Visit: Payer: Self-pay

## 2021-01-11 DIAGNOSIS — M79675 Pain in left toe(s): Secondary | ICD-10-CM

## 2021-01-11 DIAGNOSIS — R52 Pain, unspecified: Secondary | ICD-10-CM

## 2021-01-11 DIAGNOSIS — B351 Tinea unguium: Secondary | ICD-10-CM

## 2021-01-11 DIAGNOSIS — L84 Corns and callosities: Secondary | ICD-10-CM

## 2021-01-11 DIAGNOSIS — M79674 Pain in right toe(s): Secondary | ICD-10-CM

## 2021-01-14 ENCOUNTER — Encounter: Payer: Self-pay | Admitting: Podiatry

## 2021-01-14 NOTE — Progress Notes (Signed)
  Subjective:  Patient ID: Colleen Curtis, female    DOB: 30-Apr-1965,  MRN: [id number]  Chief Complaint  Patient presents with   Callouses     SELF PAY* NP // [employer]    55 y.o. female presents with the above complaint. History confirmed with patient.  She has thick calluses and painful thick nails  Objective:  Physical Exam: warm, good capillary refill, no trophic changes or ulcerative lesions, normal DP and PT pulses, normal sensory exam, and multiple calluses, thickened elongated yellowed brown discolored nails.  Assessment:   1. Callus of foot   2. Pain   3. Pain due to onychomycosis of toenails of both feet      Plan:  Patient was evaluated and treated and all questions answered.  Discussed the etiology and treatment options for the condition in detail with the patient. Educated patient on the topical and oral treatment options for mycotic nails. Recommended debridement of the nails today. Sharp and mechanical debridement performed of all painful and mycotic nails today. Nails debrided in length and thickness using a nail nipper to level of comfort. Discussed treatment options including appropriate shoe gear. Follow up as needed for painful nails.   All symptomatic hyperkeratoses were safely debrided with a sterile #15 blade to patient's level of comfort without incident. We discussed preventative and palliative care of these lesions including supportive and accommodative shoegear, padding, prefabricated and custom molded accommodative orthoses, use of a pumice stone and lotions/creams daily.   Return if symptoms worsen or fail to improve.

## 2021-04-06 ENCOUNTER — Telehealth: Payer: Self-pay

## 2021-04-06 NOTE — Telephone Encounter (Signed)
Called patient to discuss the PREVAIL study with no answer. Was unable to leave a message, the voice mail just says to call again later. Patient doesn't have My Chart set up nor does she have a listed e-mail to contact her that way. Will try again at a later date.

## 2021-07-16 ENCOUNTER — Other Ambulatory Visit: Payer: Self-pay | Admitting: Family Medicine

## 2021-07-16 ENCOUNTER — Telehealth: Payer: Self-pay

## 2021-07-16 DIAGNOSIS — E049 Nontoxic goiter, unspecified: Secondary | ICD-10-CM

## 2021-07-16 NOTE — Telephone Encounter (Signed)
Referral only

## 2021-07-17 ENCOUNTER — Telehealth: Payer: Self-pay

## 2021-07-17 NOTE — Telephone Encounter (Signed)
NOTES SCANNED TO REFERRAL 

## 2021-08-07 ENCOUNTER — Ambulatory Visit: Payer: 59 | Admitting: Podiatry

## 2021-08-07 DIAGNOSIS — L84 Corns and callosities: Secondary | ICD-10-CM | POA: Diagnosis not present

## 2021-08-07 DIAGNOSIS — M79674 Pain in right toe(s): Secondary | ICD-10-CM

## 2021-08-07 DIAGNOSIS — M79675 Pain in left toe(s): Secondary | ICD-10-CM

## 2021-08-07 DIAGNOSIS — B351 Tinea unguium: Secondary | ICD-10-CM | POA: Diagnosis not present

## 2021-08-07 DIAGNOSIS — R52 Pain, unspecified: Secondary | ICD-10-CM | POA: Diagnosis not present

## 2021-08-09 NOTE — Progress Notes (Signed)
Subjective: ?56 year old female presents the office today requesting calluses be trimmed, but not too deep as she has to work today.  Also some nails be trimmed as are thickened elongated.  Both the nails and the calluses cause discomfort.  She denies any open sores or any swelling or redness or any drainage. ? ?Objective: ?AAO x3, NAD ?DP/PT pulses palpable bilaterally, CRT less than 3 seconds ?Thick hyperkeratotic lesions present left foot submetatarsal 1, 5 as well as the right plantar hallux and submetatarsal 5.  There is no underlying ulceration drainage or any signs of infection noted today.   ?Nails are hypertrophic, dystrophic, brittle, discolored, elongated ?10. No surrounding redness or drainage. Tenderness nails 1-5 bilaterally. No open lesions or pre-ulcerative lesions are identified today. ?No pain with calf compression, swelling, warmth, erythema ? ?Assessment: ?Symptomatic onychomycosis, hyperkeratotic lesions ? ?Plan: ?-All treatment options discussed with the patient including all alternatives, risks, complications.  ?-Sharply debrided nails x10 without any complications or bleeding ?-Sharp debride the calluses x4 without any complications or bleeding.  Recommend moisturizer, offloading. ?-Watch for any signs or signs of infection or skin breakdown. ?-Patient encouraged to call the office with any questions, concerns, change in symptoms.  ? ?Trula Slade DPM ? ?

## 2021-10-10 ENCOUNTER — Ambulatory Visit (INDEPENDENT_AMBULATORY_CARE_PROVIDER_SITE_OTHER): Payer: 59

## 2021-10-10 ENCOUNTER — Ambulatory Visit (INDEPENDENT_AMBULATORY_CARE_PROVIDER_SITE_OTHER): Payer: 59 | Admitting: Podiatry

## 2021-10-10 DIAGNOSIS — M216X1 Other acquired deformities of right foot: Secondary | ICD-10-CM

## 2021-10-10 DIAGNOSIS — E119 Type 2 diabetes mellitus without complications: Secondary | ICD-10-CM

## 2021-10-10 DIAGNOSIS — L989 Disorder of the skin and subcutaneous tissue, unspecified: Secondary | ICD-10-CM | POA: Diagnosis not present

## 2021-10-10 DIAGNOSIS — Z01818 Encounter for other preprocedural examination: Secondary | ICD-10-CM | POA: Diagnosis not present

## 2021-10-10 DIAGNOSIS — M79671 Pain in right foot: Secondary | ICD-10-CM

## 2021-10-11 NOTE — Progress Notes (Signed)
Subjective:  Patient ID: Colleen Curtis, female    DOB: 29-Jun-1965,  MRN: [id number]  Chief Complaint  Patient presents with   Callouses    Bilateral callus foot pain - patient states this is affecting her gait due to the constant pain    56 y.o. female presents with the above complaint.  Patient presents with complaint of right fifth metatarsal plantarflexed leading to underlying callus and pain.  Patient states she is diabetic.  She does not recall her A1c.  She also has a right hallux benign skin lesion on the plantar side.  Patient states painful to touch is progressive gotten worse.  She has tried conservative treatment options including padding protecting shaving none of which has helped.  She would like to discuss surgical options at this time.   Review of Systems: Negative except as noted in the HPI. Denies N/V/F/Ch.  Past Medical History:  Diagnosis Date   Bipolar disorder ([place])    Cancer ([place])    COPD (chronic obstructive pulmonary disease) ([facility])    Coronary artery disease    Diverticulitis    Hypertension    Myocardial infarction ([place])    Seizure ([place])    Stroke ([place])     Current Outpatient Medications:    acetaminophen (TYLENOL) 325 MG tablet, Take 2 tablets (650 mg total) by mouth every 4 (four) hours as needed for headache or mild pain., Disp: , Rfl:    albuterol (VENTOLIN HFA) 108 (90 Base) MCG/ACT inhaler, Inhale 1 puff into the lungs daily as needed for wheezing or shortness of breath., Disp: , Rfl:    allopurinol (ZYLOPRIM) 100 MG tablet, Take 1 tablet (100 mg total) by mouth daily., Disp: 30 tablet, Rfl: 1   amLODipine (NORVASC) 5 MG tablet, Take 1 tablet (5 mg total) by mouth daily., Disp: 30 tablet, Rfl: 1   aspirin 81 MG EC tablet, Take 1 tablet (81 mg total) by mouth daily. Swallow whole., Disp: 30 tablet, Rfl: 11   atorvastatin (LIPITOR) 80 MG tablet, Take 1 tablet (80 mg total) by mouth at bedtime., Disp: 30 tablet, Rfl: 3   carvedilol (COREG) 12.5  MG tablet, Take 1 tablet (12.5 mg total) by mouth 2 (two) times daily with a meal., Disp: 60 tablet, Rfl: 1   clopidogrel (PLAVIX) 75 MG tablet, Take 1 tablet (75 mg total) by mouth daily., Disp: 30 tablet, Rfl: 1   isosorbide mononitrate (IMDUR) 60 MG 24 hr tablet, Take 1 tablet (60 mg total) by mouth daily., Disp: 30 tablet, Rfl: 1   nitroGLYCERIN (NITROSTAT) 0.4 MG SL tablet, Place 0.4 mg under the tongue every 5 (five) minutes as needed for chest pain., Disp: , Rfl:    ranolazine (RANEXA) 1000 MG SR tablet, Take 1 tablet (1,000 mg total) by mouth in the morning and at bedtime., Disp: 60 tablet, Rfl: 1  Social History   Tobacco Use  Smoking Status Some Days   Types: Cigars  Smokeless Tobacco Never    No Known Allergies Objective:  There were no vitals filed for this visit. There is no height or weight on file to calculate BMI. Constitutional Well developed. Well nourished.  Vascular Dorsalis pedis pulses palpable bilaterally. Posterior tibial pulses palpable bilaterally. Capillary refill normal to all digits.  No cyanosis or clubbing noted. Pedal hair growth normal.  Neurologic Normal speech. Oriented to person, place, and time. Epicritic sensation to light touch grossly present bilaterally.  Dermatologic Nails well groomed and normal in appearance. No open wounds. No skin  lesions.  Orthopedic: Right plantarflexed fifth metatarsal noted with submetatarsal 5 lesion.  Painful to touch.  Right plantar hallux benign skin lesion noted painful to touch.   Radiographs: 3 views of skeletally mature the right foot:Plantarflexed fifth metatarsal noted.  No other bony abnormalities identified.  Pes planovalgus foot structure noted.  No bunion deformity noted.  Some arthritis noted at the first metatarsophalangeal joint. Assessment:   1. Plantar flexed metatarsal, right   2. Type 2 diabetes mellitus without complication, without long-term current use of insulin ([place])   3. Encounter for  preoperative examination for general surgical procedure   4. Benign skin lesion    Plan:  Patient was evaluated and treated and all questions answered.  Right plantarflexed fifth metatarsal with right plantar hallux benign skin lesion -All questions and concerns were discussed with the patient in extensive detail.  Given that she has failed all conservative treatment options including shaving padding protecting shoe gear modification she will benefit from a surgical intervention she will benefit a right fifth floating osteotomy with surgical excision of the benign skin lesion.  I discussed my preintra postoperative plan in extensive detail she states understand like to proceed with surgery -I have ordered A1c if is less than 8% we will proceed with surgery -Informed surgical risk consent was reviewed and read aloud to the patient.  I reviewed the films.  I have discussed my findings with the patient in great detail.  I have discussed all risks including but not limited to infection, stiffness, scarring, limp, disability, deformity, damage to blood vessels and nerves, numbness, poor healing, need for braces, arthritis, chronic pain, amputation, death.  All benefits and realistic expectations discussed in great detail.  I have made no promises as to the outcome.  I have provided realistic expectations.  I have offered the patient a 2nd opinion, which they have declined and assured me they preferred to proceed despite the risks   No follow-ups on file.

## 2021-10-31 ENCOUNTER — Emergency Department (HOSPITAL_COMMUNITY)
Admission: EM | Admit: 2021-10-31 | Discharge: 2021-10-31 | Disposition: A | Discharge status: Home / Self Care | Payer: 59 | Attending: Emergency Medicine | Admitting: Emergency Medicine

## 2021-10-31 ENCOUNTER — Emergency Department (HOSPITAL_COMMUNITY): Payer: 59

## 2021-10-31 ENCOUNTER — Other Ambulatory Visit: Payer: Self-pay

## 2021-10-31 ENCOUNTER — Encounter (HOSPITAL_COMMUNITY): Payer: Self-pay

## 2021-10-31 DIAGNOSIS — R778 Other specified abnormalities of plasma proteins: Secondary | ICD-10-CM | POA: Insufficient documentation

## 2021-10-31 DIAGNOSIS — R079 Chest pain, unspecified: Secondary | ICD-10-CM

## 2021-10-31 DIAGNOSIS — J189 Pneumonia, unspecified organism: Secondary | ICD-10-CM | POA: Diagnosis not present

## 2021-10-31 DIAGNOSIS — I10 Essential (primary) hypertension: Secondary | ICD-10-CM | POA: Diagnosis not present

## 2021-10-31 DIAGNOSIS — Z79899 Other long term (current) drug therapy: Secondary | ICD-10-CM | POA: Diagnosis not present

## 2021-10-31 DIAGNOSIS — Z955 Presence of coronary angioplasty implant and graft: Secondary | ICD-10-CM | POA: Diagnosis not present

## 2021-10-31 DIAGNOSIS — Z7982 Long term (current) use of aspirin: Secondary | ICD-10-CM | POA: Insufficient documentation

## 2021-10-31 DIAGNOSIS — R1013 Epigastric pain: Secondary | ICD-10-CM | POA: Diagnosis not present

## 2021-10-31 DIAGNOSIS — Z7902 Long term (current) use of antithrombotics/antiplatelets: Secondary | ICD-10-CM | POA: Diagnosis not present

## 2021-10-31 DIAGNOSIS — D72829 Elevated white blood cell count, unspecified: Secondary | ICD-10-CM | POA: Diagnosis not present

## 2021-10-31 LAB — COMPREHENSIVE METABOLIC PANEL
ALT: 13 U/L (ref 0–44)
AST: 15 U/L (ref 15–41)
Albumin: 3.3 g/dL — ABNORMAL LOW (ref 3.5–5.0)
Alkaline Phosphatase: 71 U/L (ref 38–126)
Anion gap: 8 (ref 5–15)
BUN: 20 mg/dL (ref 6–20)
CO2: 21 mmol/L — ABNORMAL LOW (ref 22–32)
Calcium: 8.7 mg/dL — ABNORMAL LOW (ref 8.9–10.3)
Chloride: 108 mmol/L (ref 98–111)
Creatinine, Ser: 2.54 mg/dL — ABNORMAL HIGH (ref 0.44–1.00)
GFR, Estimated: 21 mL/min — ABNORMAL LOW (ref >60)
Glucose, Bld: 101 mg/dL — ABNORMAL HIGH (ref 70–99)
Potassium: 3.9 mmol/L (ref 3.5–5.1)
Sodium: 137 mmol/L (ref 135–145)
Total Bilirubin: 0.5 mg/dL (ref 0.3–1.2)
Total Protein: 6.6 g/dL (ref 6.5–8.1)

## 2021-10-31 LAB — TROPONIN I (HIGH SENSITIVITY)
Troponin I (High Sensitivity): 70 ng/L — ABNORMAL HIGH (ref <18)
Troponin I (High Sensitivity): 58 ng/L — ABNORMAL HIGH (ref <18)

## 2021-10-31 LAB — CBC WITH DIFFERENTIAL/PLATELET
Abs Immature Granulocytes: 0.04 10*3/uL (ref 0.00–0.07)
Basophils Absolute: 0 10*3/uL (ref 0.0–0.1)
Basophils Relative: 0 %
Eosinophils Absolute: 0.1 10*3/uL (ref 0.0–0.5)
Eosinophils Relative: 1 %
HCT: 44.1 % (ref 36.0–46.0)
Hemoglobin: 14.7 g/dL (ref 12.0–15.0)
Immature Granulocytes: 0 %
Lymphocytes Relative: 19 %
Lymphs Abs: 2.6 10*3/uL (ref 0.7–4.0)
MCH: 27.4 pg (ref 26.0–34.0)
MCHC: 33.3 g/dL (ref 30.0–36.0)
MCV: 82.3 fL (ref 80.0–100.0)
Monocytes Absolute: 1.2 10*3/uL — ABNORMAL HIGH (ref 0.1–1.0)
Monocytes Relative: 9 %
Neutro Abs: 9.7 10*3/uL — ABNORMAL HIGH (ref 1.7–7.7)
Neutrophils Relative %: 71 %
Platelets: 270 10*3/uL (ref 150–400)
RBC: 5.36 MIL/uL — ABNORMAL HIGH (ref 3.87–5.11)
RDW: 14.4 % (ref 11.5–15.5)
WBC: 13.7 10*3/uL — ABNORMAL HIGH (ref 4.0–10.5)
nRBC: 0 % (ref 0.0–0.2)

## 2021-10-31 LAB — LIPASE, BLOOD: Lipase: 40 U/L (ref 11–51)

## 2021-10-31 MED ORDER — SODIUM CHLORIDE 0.9 % IV BOLUS
1000.0000 mL | Freq: Once | INTRAVENOUS | Status: AC
  Administered 2021-10-31: 1000 mL via INTRAVENOUS

## 2021-10-31 MED ORDER — CARVEDILOL 12.5 MG PO TABS
12.5000 mg | ORAL_TABLET | Freq: Two times a day (BID) | ORAL | Status: DC
  Administered 2021-10-31: 12.5 mg via ORAL
  Filled 2021-10-31: qty 1

## 2021-10-31 MED ORDER — ONDANSETRON HCL 4 MG/2ML IJ SOLN
4.0000 mg | Freq: Once | INTRAMUSCULAR | Status: AC
  Administered 2021-10-31: 4 mg via INTRAVENOUS
  Filled 2021-10-31: qty 2

## 2021-10-31 MED ORDER — AMLODIPINE BESYLATE 5 MG PO TABS: 5.0000 mg | ORAL_TABLET | Freq: Once | ORAL | Status: DC

## 2021-10-31 MED ORDER — AMOXICILLIN-POT CLAVULANATE 875-125 MG PO TABS: 1.0000 | ORAL_TABLET | Freq: Two times a day (BID) | ORAL | 0 refills | Status: DC

## 2021-10-31 MED ORDER — ACETAMINOPHEN-CODEINE 300-30 MG PO TABS: 1.0000 | ORAL_TABLET | Freq: Four times a day (QID) | ORAL | 0 refills | Status: DC | PRN

## 2021-10-31 MED ORDER — ACETAMINOPHEN 325 MG PO TABS
650.0000 mg | ORAL_TABLET | Freq: Once | ORAL | Status: AC
  Administered 2021-10-31: 650 mg via ORAL
  Filled 2021-10-31: qty 2

## 2021-10-31 MED ORDER — ONDANSETRON HCL 4 MG PO TABS: 4.0000 mg | ORAL_TABLET | ORAL | 0 refills | Status: DC | PRN

## 2021-10-31 MED ORDER — MORPHINE SULFATE (PF) 4 MG/ML IV SOLN
4.0000 mg | Freq: Once | INTRAVENOUS | Status: AC
  Administered 2021-10-31: 4 mg via INTRAVENOUS
  Filled 2021-10-31: qty 1

## 2021-10-31 MED ORDER — SODIUM CHLORIDE 0.9 % IV SOLN
500.0000 mg | Freq: Once | INTRAVENOUS | Status: AC
  Administered 2021-10-31: 500 mg via INTRAVENOUS
  Filled 2021-10-31: qty 5

## 2021-10-31 MED ORDER — SODIUM CHLORIDE 0.9 % IV SOLN
1.0000 g | Freq: Once | INTRAVENOUS | Status: AC
  Administered 2021-10-31: 1 g via INTRAVENOUS
  Filled 2021-10-31: qty 10

## 2021-10-31 MED ORDER — AZITHROMYCIN 250 MG PO TABS: 250.0000 mg | ORAL_TABLET | Freq: Every day | ORAL | 0 refills | Status: DC

## 2021-10-31 NOTE — Discharge Instructions (Addendum)
It was a pleasure caring for you today in the emergency department. ° °Please return to the emergency department for any worsening or worrisome symptoms. ° ° °

## 2021-10-31 NOTE — ED Notes (Signed)
Patient verbalizes understanding of discharge instructions. Opportunity for questioning and answers were provided. Armband removed by staff, pt discharged from ED ambulatory. Given taxi voucher.

## 2021-10-31 NOTE — ED Triage Notes (Signed)
N/V x 3 days today woke up with additional complaints of CP radiating down her left arm. With SOB NSR not compliant with Htn medication

## 2021-10-31 NOTE — ED Provider Notes (Signed)
[facility] EMERGENCY DEPARTMENT Provider Note   CSN: [id number] Arrival date & time: 10/31/21  1214     History  Chief Complaint  Patient presents with   Nausea   Chest Pain    Colleen Curtis is a 56 y.o. female.  Patient is a 56 year old female with past medical history of myocardial infarction with angiography presenting for complaints of abdominal pain.  Patient with epigastric abdominal pain x3 days with associated nausea and vomiting.  Admits to severe, multiple visits of nonbilious nonbloody emesis.  States she awoke this morning with chest pain under the left breast and radiation down the left arm.  The history is provided by the patient. No language interpreter was used.  Chest Pain Associated symptoms: abdominal pain, nausea and vomiting   Associated symptoms: no back pain, no cough, no fever, no palpitations and no shortness of breath        Home Medications Prior to Admission medications   Medication Sig Start Date End Date Taking? Authorizing Provider  acetaminophen (TYLENOL) 325 MG tablet Take 2 tablets (650 mg total) by mouth every 4 (four) hours as needed for headache or mild pain. 11/29/20  Yes Dixie Dials, MD  albuterol (VENTOLIN HFA) 108 (90 Base) MCG/ACT inhaler Inhale 1 puff into the lungs daily as needed for wheezing or shortness of breath.   Yes [provider]  allopurinol (ZYLOPRIM) 100 MG tablet Take 1 tablet (100 mg total) by mouth daily. Patient not taking: Reported on 10/31/2021 11/29/20   Dixie Dials, MD  amLODipine (NORVASC) 5 MG tablet Take 1 tablet (5 mg total) by mouth daily. Patient not taking: Reported on 10/31/2021 11/29/20   Dixie Dials, MD  aspirin 81 MG EC tablet Take 1 tablet (81 mg total) by mouth daily. Swallow whole. Patient not taking: Reported on 10/31/2021 11/29/20   Dixie Dials, MD  atorvastatin (LIPITOR) 80 MG tablet Take 1 tablet (80 mg total) by mouth at bedtime. Patient not taking: Reported  on 10/31/2021 11/29/20   Dixie Dials, MD  carvedilol (COREG) 12.5 MG tablet Take 1 tablet (12.5 mg total) by mouth 2 (two) times daily with a meal. Patient not taking: Reported on 10/31/2021 11/29/20   Dixie Dials, MD  clopidogrel (PLAVIX) 75 MG tablet Take 1 tablet (75 mg total) by mouth daily. Patient not taking: Reported on 10/31/2021 11/29/20   Dixie Dials, MD  isosorbide mononitrate (IMDUR) 60 MG 24 hr tablet Take 1 tablet (60 mg total) by mouth daily. Patient not taking: Reported on 10/31/2021 11/29/20   Dixie Dials, MD  nitroGLYCERIN (NITROSTAT) 0.4 MG SL tablet Place 0.4 mg under the tongue every 5 (five) minutes as needed for chest pain. Patient not taking: Reported on 10/31/2021    [provider]  ranolazine (RANEXA) 1000 MG SR tablet Take 1 tablet (1,000 mg total) by mouth in the morning and at bedtime. Patient not taking: Reported on 10/31/2021 11/29/20   Dixie Dials, MD      Allergies    Patient has no known allergies.    Review of Systems   Review of Systems  Constitutional:  Negative for chills and fever.  HENT:  Negative for ear pain and sore throat.   Eyes:  Negative for pain and visual disturbance.  Respiratory:  Negative for cough and shortness of breath.   Cardiovascular:  Positive for chest pain. Negative for palpitations.  Gastrointestinal:  Positive for abdominal pain, nausea and vomiting.  Genitourinary:  Negative for dysuria and hematuria.  Musculoskeletal:  Negative for arthralgias and back pain.  Skin:  Negative for color change and rash.  Neurological:  Negative for seizures and syncope.  All other systems reviewed and are negative.   Physical Exam Updated Vital Signs BP (!) 164/101   Pulse 80   Temp 98 F (36.7 C) (Oral)   Resp 19   Ht '5\' 3"'$  (1.6 m)   Wt 120.6 kg   SpO2 97%   BMI 47.10 kg/m  Physical Exam Vitals and nursing note reviewed.  Constitutional:      General: She is not in acute distress.    Appearance: She is  well-developed.  HENT:     Head: Normocephalic and atraumatic.  Eyes:     Conjunctiva/sclera: Conjunctivae normal.  Cardiovascular:     Rate and Rhythm: Normal rate and regular rhythm.     Heart sounds: No murmur heard. Pulmonary:     Effort: Pulmonary effort is normal. No respiratory distress.     Breath sounds: Normal breath sounds.  Abdominal:     Palpations: Abdomen is soft.     Tenderness: There is abdominal tenderness in the epigastric area. There is no guarding or rebound.  Musculoskeletal:        General: No swelling.     Cervical back: Neck supple.  Skin:    General: Skin is warm and dry.     Capillary Refill: Capillary refill takes less than 2 seconds.  Neurological:     Mental Status: She is alert.  Psychiatric:        Mood and Affect: Mood normal.     ED Results / Procedures / Treatments   Labs (all labs ordered are listed, but only abnormal results are displayed) Labs Reviewed  CBC WITH DIFFERENTIAL/PLATELET - Abnormal; Notable for the following components:      Result Value   WBC 13.7 (*)    RBC 5.36 (*)    Neutro Abs 9.7 (*)    Monocytes Absolute 1.2 (*)    All other components within normal limits  COMPREHENSIVE METABOLIC PANEL - Abnormal; Notable for the following components:   CO2 21 (*)    Glucose, Bld 101 (*)    Creatinine, Ser 2.54 (*)    Calcium 8.7 (*)    Albumin 3.3 (*)    GFR, Estimated 21 (*)    All other components within normal limits  TROPONIN I (HIGH SENSITIVITY) - Abnormal; Notable for the following components:   Troponin I (High Sensitivity) 70 (*)    All other components within normal limits  TROPONIN I (HIGH SENSITIVITY) - Abnormal; Notable for the following components:   Troponin I (High Sensitivity) 58 (*)    All other components within normal limits  LIPASE, BLOOD    EKG EKG Interpretation  Date/Time:  Wednesday October 31 2021 12:19:40 EDT Ventricular Rate:  85 PR Interval:  168 QRS Duration: 99 QT Interval:  400 QTC  Calculation: 476 R Axis:   69 Text Interpretation: Sinus rhythm Biatrial enlargement similar to prior 8/22 Confirmed by Wynona Dove ([id number]) on 10/31/2021 4:33:57 PM  Radiology CT ABDOMEN PELVIS WO CONTRAST  Result Date: 10/31/2021 CLINICAL DATA:  Acute abdominal pain. EXAM: CT ABDOMEN AND PELVIS WITHOUT CONTRAST TECHNIQUE: Multidetector CT imaging of the abdomen and pelvis was performed following the standard protocol without IV contrast. RADIATION DOSE REDUCTION: This exam was performed according to the departmental dose-optimization program which includes automated exposure control, adjustment of the mA and/or kV according to patient size and/or use of  iterative reconstruction technique. COMPARISON:  None Available. FINDINGS: Lower chest: Mild dependent atelectasis bilaterally. Heart is at the upper limits of normal in size to mildly enlarged. No pericardial or pleural effusion. Distal esophagus is unremarkable. Hepatobiliary: Liver is unremarkable. Cholecystectomy. No biliary ductal dilatation. Pancreas: Negative. Spleen: Negative. Adrenals/Urinary Tract: Adrenal glands and kidneys are unremarkable. Ureters are decompressed. Bladder is low in volume. Stomach/Bowel: Stomach and small bowel are unremarkable. Appendix is not readily visualized. Left hemicolectomy. Colon is otherwise unremarkable. Vascular/Lymphatic: Atherosclerotic calcification of the aorta. IVC filter in place. No pathologically enlarged lymph nodes. Reproductive: Uterus is visualized.  No adnexal mass. Other: No free fluid. Mesenteries and peritoneum are unremarkable. Small supraumbilical midline ventral hernia contains fat. Musculoskeletal: Degenerative changes in the spine. No worrisome lytic or sclerotic lesions. IMPRESSION: 1. No acute findings to explain the patient's pain. 2.  Aortic atherosclerosis (ICD10-I70.0). Electronically Signed   By: Lorin Picket M.D.   On: 10/31/2021 16:21   DG Chest Portable 1 View  Result Date:  10/31/2021 CLINICAL DATA:  Chest pain. EXAM: PORTABLE CHEST 1 VIEW COMPARISON:  None Available. FINDINGS: Hazy left greater than right basilar opacities. No visible pleural effusions or pneumothorax. Enlarged cardiac silhouette. ACDF. IMPRESSION: 1. Low lung volumes. Hazy left greater than right bibasilar opacities, which could represent atelectasis or pneumonia. 2. Cardiomegaly. Electronically Signed   By: Margaretha Sheffield M.D.   On: 10/31/2021 12:59    Procedures Procedures    Medications Ordered in ED Medications  cefTRIAXone (ROCEPHIN) 1 g in sodium chloride 0.9 % 100 mL IVPB (has no administration in time range)  azithromycin (ZITHROMAX) 500 mg in sodium chloride 0.9 % 250 mL IVPB (has no administration in time range)  carvedilol (COREG) tablet 12.5 mg (has no administration in time range)  sodium chloride 0.9 % bolus 1,000 mL (1,000 mLs Intravenous Bolus 10/31/21 1344)  ondansetron (ZOFRAN) injection 4 mg (4 mg Intravenous Given 10/31/21 1344)  morphine (PF) 4 MG/ML injection 4 mg (4 mg Intravenous Given 10/31/21 1552)    ED Course/ Medical Decision Making/ A&P                           Medical Decision Making Amount and/or Complexity of Data Reviewed Labs: ordered. Radiology: ordered.  Risk Prescription drug management.   29:78 PM 56 year old female with past medical history of myocardial infarction with angiography presenting for complaints of abdominal pain and chest pain with radiation to the left upper extremity.  Patient is alert and oriented x3, no acute distress, afebrile, stable vital signs.  His exam demonstrates no rashes.  No reproducible tenderness.  Equal bilateral breath sounds with no adventitious lung sounds.  No hypoxia or respiratory distress.  I independently interpreted patient's labs and EKG.   EKG on 10/31/2021 demonstrates sinus rhythm with a rate of 85 bpm.  No ST segment elevation or depression.  No T wave inversions.  Normal axis.  Troponin minimally  elevated at 70 repeat 58.  Chest x-ray significant for bilateral opacities.  Patient also has leukocytosis with neutrophilia at 13.7 and 9.7 respectively.  Otherwise no signs or symptoms of sepsis.  Patient given medication for pain management and antibiotics to cover for community-acquired pneumonia.  Patient still having chest pain at this time.  Patient has a heart score of 5 secondary to obesity, hypertension, cardiac stents, and elevated troponins.  Consult placed to cardiology for further follow-up.  Plan for likely discharge.  Care signed out to oncoming  provider.        Final Clinical Impression(s) / ED Diagnoses Final diagnoses:  Community acquired pneumonia, unspecified laterality  Chest pain, unspecified type    Rx / DC Orders ED Discharge Orders     None         Lianne Cure, DO 36/01/65 1720

## 2021-10-31 NOTE — ED Provider Notes (Signed)
  Provider Note MRN:  [id number]  Arrival date & time: 10/31/21    ED Course and Medical Decision Making  Assumed care from [facility] at shift change.  See note from prior team for complete details, in brief:  56 yo female  To ED w/ chest pain radiation to her left arm, SOB Variable med compliance LHC 8/22 [place] Trop elevated 70 > 58, ECG stable WBC 13.7, Cr 2.54 (similar to baseline) CTAP stable CXR with ?atelectasis vs PNA  Plan per prior physician  Delta Trop, speak with cardiology Delta Troponin is downtrending.  Pain improved.  Spoke with Dr. Doylene Canard; recommend follow-up in the office in 1 week. ECG stable.   Chest pain likely atypical, likely secondary to pneumonia.  Less likely ACS.  She is HDS, breathing comfortably on ambient air.  Stable for discharge. Discussed strict return precautions.  The patient improved significantly and was discharged in stable condition. Detailed discussions were had with the patient regarding current findings, and need for close f/u with PCP or on call doctor. The patient has been instructed to return immediately if the symptoms worsen in any way for re-evaluation. Patient verbalized understanding and is in agreement with current care plan. All questions answered prior to discharge.    .Critical Care  Performed by: Jeanell Sparrow, DO Authorized by: Jeanell Sparrow, DO   Critical care provider statement:    Critical care time (minutes):  30   Critical care time was exclusive of:  Separately billable procedures and treating other patients   Critical care was necessary to treat or prevent imminent or life-threatening deterioration of the following conditions:  Cardiac failure   Critical care was time spent personally by me on the following activities:  Development of treatment plan with patient or surrogate, discussions with consultants, evaluation of patient's response to treatment, examination of patient, ordering and review of laboratory studies,  ordering and review of radiographic studies, ordering and performing treatments and interventions, pulse oximetry, re-evaluation of patient's condition, review of old charts and obtaining history from patient or surrogate   Final Clinical Impressions(s) / ED Diagnoses     ICD-10-CM   1. Community acquired pneumonia, unspecified laterality  J18.9     2. Chest pain, unspecified type  R07.9     3. Elevated troponin  R77.8       ED Discharge Orders          Ordered    amoxicillin-clavulanate (AUGMENTIN) 875-125 MG tablet  Every 12 hours        10/31/21 2126    azithromycin (ZITHROMAX) 250 MG tablet  Daily        10/31/21 2126    acetaminophen-codeine (TYLENOL #3) 300-30 MG tablet  Every 6 hours PRN        10/31/21 2131    ondansetron (ZOFRAN) 4 MG tablet  Every 4 hours PRN        10/31/21 2131              Discharge Instructions      It was a pleasure caring for you today in the emergency department.  Please return to the emergency department for any worsening or worrisome symptoms.          Jeanell Sparrow, DO 10/31/21 2132

## 2021-11-05 ENCOUNTER — Ambulatory Visit: Payer: 59 | Admitting: Podiatry

## 2022-03-15 ENCOUNTER — Ambulatory Visit: Payer: Commercial Managed Care - HMO | Admitting: Podiatry

## 2022-03-15 ENCOUNTER — Encounter: Payer: Self-pay | Admitting: Podiatry

## 2022-03-15 DIAGNOSIS — M79675 Pain in left toe(s): Secondary | ICD-10-CM

## 2022-03-15 DIAGNOSIS — Q828 Other specified congenital malformations of skin: Secondary | ICD-10-CM

## 2022-03-15 DIAGNOSIS — B351 Tinea unguium: Secondary | ICD-10-CM

## 2022-03-15 DIAGNOSIS — M79674 Pain in right toe(s): Secondary | ICD-10-CM

## 2022-03-17 NOTE — Progress Notes (Signed)
Subjective:   Patient ID: Colleen Curtis, female   DOB: 56 y.o.   MRN: [id number]   HPI Patient presents with significant pain in the bilateral feet with severe keratotic lesions very painful and nail disease with thickness and elongation 1-5 both feet painful   ROS      Objective:  Physical Exam  Neuro vascular status intact with severe lesion formation left second metatarsal right fifth metatarsal very painful when pressed with lucent cores and thickened with thick yellow brittle nailbeds 1-5 both feet that are painful     Assessment:  Mycotic nail infection with pain 1-5 both feet lesions bilateral no iatrogenic bleeding      Plan:  Reviewed conditions and at this point sterile debridement of lesions accomplished sterile debridement of nailbeds 1-5 both feet accomplished no angiogenic bleeding reappoint routine care

## 2022-04-15 ENCOUNTER — Ambulatory Visit: Payer: Commercial Managed Care - HMO | Admitting: Podiatry

## 2022-04-25 ENCOUNTER — Ambulatory Visit: Payer: Commercial Managed Care - HMO | Admitting: Podiatry

## 2022-06-05 ENCOUNTER — Observation Stay (HOSPITAL_COMMUNITY)
Admission: EM | Admit: 2022-06-05 | Discharge: 2022-06-07 | Disposition: A | Discharge status: Home / Self Care | Payer: 59 | Attending: Cardiovascular Disease | Admitting: Cardiovascular Disease

## 2022-06-05 ENCOUNTER — Encounter (HOSPITAL_COMMUNITY): Payer: Self-pay

## 2022-06-05 ENCOUNTER — Emergency Department (HOSPITAL_COMMUNITY): Payer: 59

## 2022-06-05 ENCOUNTER — Other Ambulatory Visit: Payer: Self-pay

## 2022-06-05 DIAGNOSIS — N184 Chronic kidney disease, stage 4 (severe): Secondary | ICD-10-CM | POA: Diagnosis not present

## 2022-06-05 DIAGNOSIS — Z7951 Long term (current) use of inhaled steroids: Secondary | ICD-10-CM | POA: Diagnosis not present

## 2022-06-05 DIAGNOSIS — E8721 Acute metabolic acidosis: Secondary | ICD-10-CM | POA: Diagnosis not present

## 2022-06-05 DIAGNOSIS — E049 Nontoxic goiter, unspecified: Secondary | ICD-10-CM | POA: Insufficient documentation

## 2022-06-05 DIAGNOSIS — R0602 Shortness of breath: Secondary | ICD-10-CM | POA: Insufficient documentation

## 2022-06-05 DIAGNOSIS — Z7902 Long term (current) use of antithrombotics/antiplatelets: Secondary | ICD-10-CM | POA: Insufficient documentation

## 2022-06-05 DIAGNOSIS — M10072 Idiopathic gout, left ankle and foot: Secondary | ICD-10-CM | POA: Insufficient documentation

## 2022-06-05 DIAGNOSIS — J449 Chronic obstructive pulmonary disease, unspecified: Secondary | ICD-10-CM | POA: Insufficient documentation

## 2022-06-05 DIAGNOSIS — Z79899 Other long term (current) drug therapy: Secondary | ICD-10-CM | POA: Diagnosis not present

## 2022-06-05 DIAGNOSIS — I249 Acute ischemic heart disease, unspecified: Principal | ICD-10-CM | POA: Insufficient documentation

## 2022-06-05 DIAGNOSIS — Z7982 Long term (current) use of aspirin: Secondary | ICD-10-CM | POA: Insufficient documentation

## 2022-06-05 DIAGNOSIS — Z8673 Personal history of transient ischemic attack (TIA), and cerebral infarction without residual deficits: Secondary | ICD-10-CM | POA: Diagnosis not present

## 2022-06-05 DIAGNOSIS — Z859 Personal history of malignant neoplasm, unspecified: Secondary | ICD-10-CM | POA: Diagnosis not present

## 2022-06-05 DIAGNOSIS — R0789 Other chest pain: Secondary | ICD-10-CM | POA: Diagnosis present

## 2022-06-05 DIAGNOSIS — I129 Hypertensive chronic kidney disease with stage 1 through stage 4 chronic kidney disease, or unspecified chronic kidney disease: Secondary | ICD-10-CM | POA: Insufficient documentation

## 2022-06-05 DIAGNOSIS — F17290 Nicotine dependence, other tobacco product, uncomplicated: Secondary | ICD-10-CM | POA: Insufficient documentation

## 2022-06-05 DIAGNOSIS — M2042 Other hammer toe(s) (acquired), left foot: Secondary | ICD-10-CM | POA: Diagnosis not present

## 2022-06-05 DIAGNOSIS — Z955 Presence of coronary angioplasty implant and graft: Secondary | ICD-10-CM | POA: Diagnosis not present

## 2022-06-05 DIAGNOSIS — N179 Acute kidney failure, unspecified: Secondary | ICD-10-CM | POA: Diagnosis not present

## 2022-06-05 DIAGNOSIS — I2510 Atherosclerotic heart disease of native coronary artery without angina pectoris: Secondary | ICD-10-CM | POA: Insufficient documentation

## 2022-06-05 MED ORDER — AMLODIPINE BESYLATE 5 MG PO TABS
10.0000 mg | ORAL_TABLET | Freq: Once | ORAL | Status: AC
  Administered 2022-06-05: 10 mg via ORAL
  Filled 2022-06-05: qty 2

## 2022-06-05 NOTE — ED Triage Notes (Addendum)
PER EMS: pt reports substernal chest pain that radiates to her back that started today around 1pm. Hx of stent from previous heart attack 2 years ago.  She also reports left foot swelling and toe pain.  3 SL tabs of nitro given without relief of chest pain 324 aspirin given  by ems  BP-190/140, HR-80, 99% RA

## 2022-06-05 NOTE — ED Provider Triage Note (Signed)
Emergency Medicine Provider Triage Evaluation Note  Colleen Curtis , a 57 y.o. female  was evaluated in triage.  Pt complains of anterior chest pain started today.  MS gave 3 nitroglycerin and aspirin without improvement in pain.  Patient has history of hypertension.  She reports she does not take pressure medication although she is supposed to.  She reports sometimes her blood pressures run as high as the [id number] systolic. Severe pain top of the left foot.  History of gout.  Review of Systems  Positive: Chest pain or shortness of breath Negative: Fever or vomiting  Physical Exam  BP (!) 175/112   Pulse 87   Temp 98 F (36.7 C)   Resp 19   Ht '5\' 3"'$  (1.6 m)   Wt 113.4 kg   SpO2 98%   BMI 44.29 kg/m  Gen:   Awake, no distress   Resp:  Normal effort lungs are clear.  Heart regular no rub murmur gallop MSK:   Moves extremities without difficulty patient has significant pain and some redness over the dorsum of the left foot.  No deformity. Other:  Alert with clear mental status movements coordinated purposeful  Medical Decision Making  Medically screening exam initiated at 11:13 PM.  Appropriate orders placed.  Geryl Rankins Coffey was informed that the remainder of the evaluation will be completed by another provider, this initial triage assessment does not replace that evaluation, and the importance of remaining in the ED until their evaluation is complete.     Charlesetta Shanks, MD 06/05/22 [phone]

## 2022-06-05 NOTE — ED Notes (Signed)
Patient transported to X-ray 

## 2022-06-06 ENCOUNTER — Inpatient Hospital Stay (HOSPITAL_COMMUNITY): Payer: 59

## 2022-06-06 ENCOUNTER — Encounter (HOSPITAL_COMMUNITY): Payer: Self-pay | Admitting: Cardiovascular Disease

## 2022-06-06 ENCOUNTER — Other Ambulatory Visit: Payer: Self-pay

## 2022-06-06 LAB — CBC
HCT: 39.1 % (ref 36.0–46.0)
Hemoglobin: 13 g/dL (ref 12.0–15.0)
MCH: 27.4 pg (ref 26.0–34.0)
MCHC: 33.2 g/dL (ref 30.0–36.0)
MCV: 82.5 fL (ref 80.0–100.0)
Platelets: 229 10*3/uL (ref 150–400)
RBC: 4.74 MIL/uL (ref 3.87–5.11)
RDW: 14.5 % (ref 11.5–15.5)
WBC: 11 10*3/uL — ABNORMAL HIGH (ref 4.0–10.5)
nRBC: 0 % (ref 0.0–0.2)

## 2022-06-06 LAB — CBC WITH DIFFERENTIAL/PLATELET
Abs Immature Granulocytes: 0.03 10*3/uL (ref 0.00–0.07)
Basophils Absolute: 0.1 10*3/uL (ref 0.0–0.1)
Basophils Relative: 1 %
Eosinophils Absolute: 0.3 10*3/uL (ref 0.0–0.5)
Eosinophils Relative: 2 %
HCT: 41.2 % (ref 36.0–46.0)
Hemoglobin: 13.4 g/dL (ref 12.0–15.0)
Immature Granulocytes: 0 %
Lymphocytes Relative: 24 %
Lymphs Abs: 2.7 10*3/uL (ref 0.7–4.0)
MCH: 27 pg (ref 26.0–34.0)
MCHC: 32.5 g/dL (ref 30.0–36.0)
MCV: 82.9 fL (ref 80.0–100.0)
Monocytes Absolute: 0.7 10*3/uL (ref 0.1–1.0)
Monocytes Relative: 6 %
Neutro Abs: 7.5 10*3/uL (ref 1.7–7.7)
Neutrophils Relative %: 67 %
Platelets: 243 10*3/uL (ref 150–400)
RBC: 4.97 MIL/uL (ref 3.87–5.11)
RDW: 14.5 % (ref 11.5–15.5)
WBC: 11.3 10*3/uL — ABNORMAL HIGH (ref 4.0–10.5)
nRBC: 0 % (ref 0.0–0.2)

## 2022-06-06 LAB — COMPREHENSIVE METABOLIC PANEL
ALT: 9 U/L (ref 0–44)
AST: 15 U/L (ref 15–41)
Albumin: 3.2 g/dL — ABNORMAL LOW (ref 3.5–5.0)
Alkaline Phosphatase: 70 U/L (ref 38–126)
Anion gap: 13 (ref 5–15)
BUN: 23 mg/dL — ABNORMAL HIGH (ref 6–20)
CO2: 13 mmol/L — ABNORMAL LOW (ref 22–32)
Calcium: 8.5 mg/dL — ABNORMAL LOW (ref 8.9–10.3)
Chloride: 112 mmol/L — ABNORMAL HIGH (ref 98–111)
Creatinine, Ser: 3.25 mg/dL — ABNORMAL HIGH (ref 0.44–1.00)
GFR, Estimated: 16 mL/min — ABNORMAL LOW (ref >60)
Glucose, Bld: 106 mg/dL — ABNORMAL HIGH (ref 70–99)
Potassium: 4.2 mmol/L (ref 3.5–5.1)
Sodium: 138 mmol/L (ref 135–145)
Total Bilirubin: 0.6 mg/dL (ref 0.3–1.2)
Total Protein: 6.4 g/dL — ABNORMAL LOW (ref 6.5–8.1)

## 2022-06-06 LAB — BASIC METABOLIC PANEL
Anion gap: 11 (ref 5–15)
BUN: 22 mg/dL — ABNORMAL HIGH (ref 6–20)
CO2: 15 mmol/L — ABNORMAL LOW (ref 22–32)
Calcium: 8.3 mg/dL — ABNORMAL LOW (ref 8.9–10.3)
Chloride: 111 mmol/L (ref 98–111)
Creatinine, Ser: 3.05 mg/dL — ABNORMAL HIGH (ref 0.44–1.00)
GFR, Estimated: 17 mL/min — ABNORMAL LOW (ref >60)
Glucose, Bld: 93 mg/dL (ref 70–99)
Potassium: 3.9 mmol/L (ref 3.5–5.1)
Sodium: 137 mmol/L (ref 135–145)

## 2022-06-06 LAB — BRAIN NATRIURETIC PEPTIDE: B Natriuretic Peptide: 385.9 pg/mL — ABNORMAL HIGH (ref 0.0–100.0)

## 2022-06-06 LAB — LIPID PANEL
Cholesterol: 261 mg/dL — ABNORMAL HIGH (ref 0–200)
HDL: 43 mg/dL (ref >40)
LDL Cholesterol: 173 mg/dL — ABNORMAL HIGH (ref 0–99)
Total CHOL/HDL Ratio: 6.1 RATIO
Triglycerides: 223 mg/dL — ABNORMAL HIGH (ref <150)
VLDL: 45 mg/dL — ABNORMAL HIGH (ref 0–40)

## 2022-06-06 LAB — TROPONIN I (HIGH SENSITIVITY)
Troponin I (High Sensitivity): 80 ng/L — ABNORMAL HIGH (ref <18)
Troponin I (High Sensitivity): 91 ng/L — ABNORMAL HIGH (ref <18)

## 2022-06-06 LAB — HIV ANTIBODY (ROUTINE TESTING W REFLEX): HIV Screen 4th Generation wRfx: NONREACTIVE

## 2022-06-06 LAB — LIPASE, BLOOD: Lipase: 47 U/L (ref 11–51)

## 2022-06-06 LAB — URIC ACID: Uric Acid, Serum: 7.5 mg/dL — ABNORMAL HIGH (ref 2.5–7.1)

## 2022-06-06 LAB — I-STAT BETA HCG BLOOD, ED (MC, WL, AP ONLY): I-stat hCG, quantitative: 5.5 m[IU]/mL — ABNORMAL HIGH (ref <5)

## 2022-06-06 LAB — HCG, QUANTITATIVE, PREGNANCY: hCG, Beta Chain, Quant, S: 6 m[IU]/mL — ABNORMAL HIGH (ref <5)

## 2022-06-06 LAB — HEPARIN LEVEL (UNFRACTIONATED): Heparin Unfractionated: 0.17 IU/mL — ABNORMAL LOW (ref 0.30–0.70)

## 2022-06-06 MED ORDER — ATORVASTATIN CALCIUM 40 MG PO TABS
40.0000 mg | ORAL_TABLET | Freq: Every day | ORAL | Status: DC
  Administered 2022-06-06 – 2022-06-07 (×2): 40 mg via ORAL
  Filled 2022-06-06 (×2): qty 1

## 2022-06-06 MED ORDER — HEPARIN BOLUS VIA INFUSION
2000.0000 [IU] | Freq: Once | INTRAVENOUS | Status: AC
  Administered 2022-06-06: 2000 [IU] via INTRAVENOUS
  Filled 2022-06-06: qty 2000

## 2022-06-06 MED ORDER — SODIUM CHLORIDE 0.9 % IV SOLN: 250.0000 mL | INTRAVENOUS | Status: DC | PRN

## 2022-06-06 MED ORDER — FENTANYL CITRATE PF 50 MCG/ML IJ SOSY
12.5000 ug | PREFILLED_SYRINGE | Freq: Once | INTRAMUSCULAR | Status: AC
  Administered 2022-06-06: 12.5 ug via INTRAVENOUS
  Filled 2022-06-06: qty 1

## 2022-06-06 MED ORDER — ACETAMINOPHEN 325 MG PO TABS: 650.0000 mg | ORAL_TABLET | ORAL | Status: DC | PRN

## 2022-06-06 MED ORDER — SODIUM CHLORIDE 0.9% FLUSH
3.0000 mL | Freq: Two times a day (BID) | INTRAVENOUS | Status: DC
  Administered 2022-06-06: 3 mL via INTRAVENOUS

## 2022-06-06 MED ORDER — NITROGLYCERIN 0.4 MG SL SUBL: 0.4000 mg | SUBLINGUAL_TABLET | SUBLINGUAL | Status: DC | PRN

## 2022-06-06 MED ORDER — ALBUTEROL SULFATE (2.5 MG/3ML) 0.083% IN NEBU: 3.0000 mL | INHALATION_SOLUTION | Freq: Every day | RESPIRATORY_TRACT | Status: DC | PRN

## 2022-06-06 MED ORDER — ONDANSETRON HCL 4 MG/2ML IJ SOLN: 4.0000 mg | Freq: Four times a day (QID) | INTRAMUSCULAR | Status: DC | PRN

## 2022-06-06 MED ORDER — SODIUM CHLORIDE 0.9% FLUSH: 3.0000 mL | INTRAVENOUS | Status: DC | PRN

## 2022-06-06 MED ORDER — AMLODIPINE BESYLATE 5 MG PO TABS
5.0000 mg | ORAL_TABLET | Freq: Every day | ORAL | Status: DC
  Administered 2022-06-06 – 2022-06-07 (×2): 5 mg via ORAL
  Filled 2022-06-06 (×2): qty 1

## 2022-06-06 MED ORDER — CLOPIDOGREL BISULFATE 75 MG PO TABS
75.0000 mg | ORAL_TABLET | Freq: Every day | ORAL | Status: DC
  Administered 2022-06-06 – 2022-06-07 (×2): 75 mg via ORAL
  Filled 2022-06-06 (×2): qty 1

## 2022-06-06 MED ORDER — HYDRALAZINE HCL 25 MG PO TABS
25.0000 mg | ORAL_TABLET | Freq: Three times a day (TID) | ORAL | Status: DC
  Administered 2022-06-06 – 2022-06-07 (×4): 25 mg via ORAL
  Filled 2022-06-06 (×4): qty 1

## 2022-06-06 MED ORDER — HEPARIN (PORCINE) 25000 UT/250ML-% IV SOLN
1400.0000 [IU]/h | INTRAVENOUS | Status: DC
  Administered 2022-06-06 (×2): 1300 [IU]/h via INTRAVENOUS
  Filled 2022-06-06: qty 250

## 2022-06-06 MED ORDER — COLCHICINE 0.6 MG PO TABS
0.6000 mg | ORAL_TABLET | Freq: Every day | ORAL | Status: DC
  Administered 2022-06-06 – 2022-06-07 (×2): 0.6 mg via ORAL
  Filled 2022-06-06 (×2): qty 1

## 2022-06-06 MED ORDER — HEPARIN (PORCINE) 25000 UT/250ML-% IV SOLN
1100.0000 [IU]/h | INTRAVENOUS | Status: DC
  Administered 2022-06-06: 1100 [IU]/h via INTRAVENOUS
  Filled 2022-06-06: qty 250

## 2022-06-06 MED ORDER — CARVEDILOL 3.125 MG PO TABS
3.1250 mg | ORAL_TABLET | Freq: Two times a day (BID) | ORAL | Status: DC
  Administered 2022-06-06 – 2022-06-07 (×3): 3.125 mg via ORAL
  Filled 2022-06-06 (×3): qty 1

## 2022-06-06 MED ORDER — TRAMADOL HCL 50 MG PO TABS
50.0000 mg | ORAL_TABLET | Freq: Two times a day (BID) | ORAL | Status: DC
  Administered 2022-06-06 – 2022-06-07 (×3): 50 mg via ORAL
  Filled 2022-06-06 (×3): qty 1

## 2022-06-06 MED ORDER — INFLUENZA VAC SPLIT QUAD 0.5 ML IM SUSY
0.5000 mL | PREFILLED_SYRINGE | INTRAMUSCULAR | Status: DC
  Filled 2022-06-06: qty 0.5

## 2022-06-06 MED ORDER — HEPARIN BOLUS VIA INFUSION
4000.0000 [IU] | Freq: Once | INTRAVENOUS | Status: AC
  Administered 2022-06-06: 4000 [IU] via INTRAVENOUS
  Filled 2022-06-06: qty 4000

## 2022-06-06 MED ORDER — ASPIRIN 81 MG PO TBEC
81.0000 mg | DELAYED_RELEASE_TABLET | Freq: Every day | ORAL | Status: DC
  Administered 2022-06-07: 81 mg via ORAL
  Filled 2022-06-06: qty 1

## 2022-06-06 MED ORDER — ACETAMINOPHEN 325 MG PO TABS
650.0000 mg | ORAL_TABLET | Freq: Once | ORAL | Status: AC
  Administered 2022-06-06: 650 mg via ORAL
  Filled 2022-06-06: qty 2

## 2022-06-06 MED ORDER — ALLOPURINOL 100 MG PO TABS
100.0000 mg | ORAL_TABLET | Freq: Every day | ORAL | Status: DC
  Administered 2022-06-06 – 2022-06-07 (×2): 100 mg via ORAL
  Filled 2022-06-06 (×2): qty 1

## 2022-06-06 MED ORDER — ISOSORBIDE MONONITRATE ER 60 MG PO TB24
60.0000 mg | ORAL_TABLET | Freq: Every day | ORAL | Status: DC
  Administered 2022-06-06 – 2022-06-07 (×2): 60 mg via ORAL
  Filled 2022-06-06: qty 2
  Filled 2022-06-06: qty 1

## 2022-06-06 MED ORDER — NITROGLYCERIN 2 % TD OINT
0.5000 [in_us] | TOPICAL_OINTMENT | Freq: Once | TRANSDERMAL | Status: AC
  Administered 2022-06-06: 0.5 [in_us] via TOPICAL
  Filled 2022-06-06: qty 1

## 2022-06-06 NOTE — Progress Notes (Signed)
ANTICOAGULATION CONSULT NOTE  Pharmacy Consult for Heparin Indication: chest pain/ACS  No Known Allergies  Patient Measurements: Height: '5\' 3"'$  (160 cm) Weight: 106.4 kg (234 lb 9.1 oz) IBW/kg (Calculated) : 52.4 Heparin Dosing Weight: 78 kg  Vital Signs: Temp: 98.7 F (37.1 C) (02/29 1427) Temp Source: Oral (02/29 1427) BP: 136/96 (02/29 1427) Pulse Rate: 75 (02/29 1427)  Labs: Recent Labs    06/05/22 2344 06/06/22 0210 06/06/22 0505 06/06/22 1500  HGB 13.4  --  13.0  --   HCT 41.2  --  39.1  --   PLT 243  --  229  --   HEPARINUNFRC  --   --   --  0.17*  CREATININE 3.25*  --  3.05*  --   TROPONINIHS 91* 80*  --   --     Estimated Creatinine Clearance: 23.2 mL/min (A) (by C-G formula based on SCr of 3.05 mg/dL (H)).   Assessment: 13 YOF with significant medical history of CAD s/p 4 stents, hx stroke, and known medical noncompliance c/o substernal CP radiating to back, no relief w/ NTG prior to arrival to ED. Pharmacy consulted to manage heparin.   CBC stable. Heparin level low 0.17 at 1100 units/hr.   Goal of Therapy:  Heparin level 0.3-0.7 units/ml Monitor platelets by anticoagulation protocol: Yes   Plan:  Heparin 2000 units IV x1 bolus Increase heparin infusion to 1300 units/hr Check heparin level in 8 hours and daily while on heparin Continue to monitor H&H and platelets  Thank you for allowing pharmacy to be a part of this patient's care.  Ardyth Harps, PharmD Clinical Pharmacist

## 2022-06-06 NOTE — ED Notes (Signed)
ED TO INPATIENT HANDOFF REPORT  ED Nurse Name and Phone #: [phone]  S Name/Age/Gender Colleen Curtis 57 y.o. female Room/Bed: 041C/041C  Code Status   Code Status: Full Code  Home/SNF/Other Home Patient oriented to: self, place, time, and situation Is this baseline? Yes   Triage Complete: Triage complete  Chief Complaint Acute coronary syndrome [facility]) [I24.9]  Triage Note PER EMS: pt reports substernal chest pain that radiates to her back that started today around 1pm. Hx of stent from previous heart attack 2 years ago.  She also reports left foot swelling and toe pain.  3 SL tabs of nitro given without relief of chest pain 324 aspirin given  by ems  BP-190/140, HR-80, 99% RA   Allergies No Known Allergies  Level of Care/Admitting Diagnosis ED Disposition     ED Disposition  Admit   Condition  --   [place]: [facility] [[id number]]  Level of Care: Telemetry Cardiac [[id number]]  May admit patient to [facility] or [facility] if equivalent level of care is available:: No  Covid Evaluation: Asymptomatic - no recent exposure (last 10 days) testing not required  Diagnosis: Acute coronary syndrome [facility]) [id number]  Admitting Physician: [place], [place]  Attending Physician: Dixie Dials [id number]  Certification:: I certify this patient will need inpatient services for at least 2 midnights  Estimated Length of Stay: 2          B Medical/Surgery History Past Medical History:  Diagnosis Date   Bipolar disorder ([facility])    Cancer ([place])    COPD (chronic obstructive pulmonary disease) ([place])    Coronary artery disease    Diverticulitis    Hypertension    Myocardial infarction ([place])    Seizure ([place])    Stroke [facility])    Past Surgical History:  Procedure Laterality Date   ABDOMINAL SURGERY     BRAIN SURGERY     CHOLECYSTECTOMY     LEFT HEART CATH AND CORONARY ANGIOGRAPHY N/A 11/27/2020   Procedure: LEFT HEART CATH AND CORONARY  ANGIOGRAPHY;  Surgeon: Dixie Dials, MD;  Location: [facility] CV [facility];  Service: Cardiovascular;  Laterality: N/A;     A IV Location/Drains/Wounds Patient Lines/Drains/Airways Status     Active Line/Drains/Airways     Name Placement date Placement time Site Days   Peripheral IV 06/06/22 20 G Posterior;Right Forearm 06/06/22  0213  Forearm  less than 1            Intake/Output Last 24 hours No intake or output data in the 24 hours ending 06/06/22 1010  Labs/Imaging Results for orders placed or performed during the hospital encounter of 06/05/22 (from the past 48 hour(s))  Brain natriuretic peptide     Status: Abnormal   Collection Time: 06/05/22 11:44 PM  Result Value Ref Range   B Natriuretic Peptide 385.9 (H) 0.0 - 100.0 pg/mL    Comment: Performed at [facility] [address], [place], [place] [address]  Comprehensive metabolic panel     Status: Abnormal   Collection Time: 06/05/22 11:44 PM  Result Value Ref Range   Sodium 138 135 - 145 mmol/L   Potassium 4.2 3.5 - 5.1 mmol/L   Chloride 112 (H) 98 - 111 mmol/L   CO2 13 (L) 22 - 32 mmol/L   Glucose, Bld 106 (H) 70 - 99 mg/dL    Comment: Glucose reference range applies only to samples taken after fasting for at least 8 hours.  BUN 23 (H) 6 - 20 mg/dL   Creatinine, Ser 3.25 (H) 0.44 - 1.00 mg/dL   Calcium 8.5 (L) 8.9 - 10.3 mg/dL   Total Protein 6.4 (L) 6.5 - 8.1 g/dL   Albumin 3.2 (L) 3.5 - 5.0 g/dL   AST 15 15 - 41 U/L   ALT 9 0 - 44 U/L   Alkaline Phosphatase 70 38 - 126 U/L   Total Bilirubin 0.6 0.3 - 1.2 mg/dL   GFR, Estimated 16 (L) >60 mL/min    Comment: (NOTE) Calculated using the CKD-EPI Creatinine Equation (2021)    Anion gap 13 5 - 15    Comment: Performed at [facility], [place] [address], [place], [place] [address]  Lipase, blood     Status: None   Collection Time: 06/05/22 11:44 PM  Result Value Ref Range   Lipase 47 11 - 51 U/L    Comment: Performed at [facility] [address], [place], [place] [address]  CBC with Differential     Status: Abnormal   Collection Time: 06/05/22 11:44 PM  Result Value Ref Range   WBC 11.3 (H) 4.0 - 10.5 K/uL   RBC 4.97 3.87 - 5.11 MIL/uL   Hemoglobin 13.4 12.0 - 15.0 g/dL   HCT 41.2 36.0 - 46.0 %   MCV 82.9 80.0 - 100.0 fL   MCH 27.0 26.0 - 34.0 pg   MCHC 32.5 30.0 - 36.0 g/dL   RDW 14.5 11.5 - 15.5 %   Platelets 243 150 - 400 K/uL   nRBC 0.0 0.0 - 0.2 %   Neutrophils Relative % 67 %   Neutro Abs 7.5 1.7 - 7.7 K/uL   Lymphocytes Relative 24 %   Lymphs Abs 2.7 0.7 - 4.0 K/uL   Monocytes Relative 6 %   Monocytes Absolute 0.7 0.1 - 1.0 K/uL   Eosinophils Relative 2 %   Eosinophils Absolute 0.3 0.0 - 0.5 K/uL   Basophils Relative 1 %   Basophils Absolute 0.1 0.0 - 0.1 K/uL   Immature Granulocytes 0 %   Abs Immature Granulocytes 0.03 0.00 - 0.07 K/uL    Comment: Performed at [facility] [address] [place], [place] [address]  Troponin I (High Sensitivity)     Status: Abnormal   Collection Time: 06/05/22 11:44 PM  Result Value Ref Range   Troponin I (High Sensitivity) 91 (H) <18 ng/L    Comment: (NOTE) Elevated high sensitivity troponin I (hsTnI) values and significant  changes across serial measurements may suggest ACS but many other  chronic and acute conditions are known to elevate hsTnI results.  Refer to the "Links" section for chest pain algorithms and additional  guidance. Performed at [facility], [place] [address], [place], [place] [address]   I-Stat beta hCG blood, ED     Status: Abnormal   Collection Time: 06/06/22 12:19 AM  Result Value Ref Range   I-stat hCG, quantitative 5.5 (H) <5 mIU/mL   Comment 3            Comment:   GEST. AGE      CONC.  (mIU/mL)   <=1 WEEK        5 - 50     2 WEEKS       50 - 500     3 WEEKS       100 - 10,000     4 WEEKS     1,000 - 30,000  FEMALE AND NON-PREGNANT FEMALE:     LESS THAN 5 mIU/mL   Troponin I (High Sensitivity)     Status: Abnormal    Collection Time: 06/06/22  2:10 AM  Result Value Ref Range   Troponin I (High Sensitivity) 80 (H) <18 ng/L    Comment: (NOTE) Elevated high sensitivity troponin I (hsTnI) values and significant  changes across serial measurements may suggest ACS but many other  chronic and acute conditions are known to elevate hsTnI results.  Refer to the "Links" section for chest pain algorithms and additional  guidance. Performed at [facility], [place] [address] [place], [place] [address]   hCG, quantitative, pregnancy     Status: Abnormal   Collection Time: 06/06/22  2:28 AM  Result Value Ref Range   hCG, Beta Chain, Quant, S 6 (H) <5 mIU/mL    Comment:          GEST. AGE      CONC.  (mIU/mL)   <=1 WEEK        5 - 50     2 WEEKS       50 - 500     3 WEEKS       100 - 10,000     4 WEEKS     1,000 - 30,000     5 WEEKS     3,500 - 115,000   6-8 WEEKS     12,000 - 270,000    12 WEEKS     15,000 - 220,000        FEMALE AND NON-PREGNANT FEMALE:     LESS THAN 5 mIU/mL Performed at [facility], [place] [address], [place], [place] [address]   HIV Antibody (routine testing w rflx)     Status: None   Collection Time: 06/06/22  5:05 AM  Result Value Ref Range   HIV Screen 4th Generation wRfx Non Reactive Non Reactive    Comment: Performed at [facility], [place] [address], [place], [place] [address]  Lipid panel     Status: Abnormal   Collection Time: 06/06/22  5:05 AM  Result Value Ref Range   Cholesterol 261 (H) 0 - 200 mg/dL   Triglycerides 223 (H) <150 mg/dL   HDL 43 >40 mg/dL   Total CHOL/HDL Ratio 6.1 RATIO   VLDL 45 (H) 0 - 40 mg/dL   LDL Cholesterol 173 (H) 0 - 99 mg/dL    Comment:        Total Cholesterol/HDL:CHD Risk Coronary Heart Disease Risk Table                     Men   Women  1/2 Average Risk   3.4   3.3  Average Risk       5.0   4.4  2 X Average Risk   9.6   7.1  3 X Average Risk  23.4   11.0        Use the calculated Patient Ratio above and the CHD Risk  Table to determine the patient's CHD Risk.        ATP III CLASSIFICATION (LDL):  <100     mg/dL   Optimal  100-129  mg/dL   Near or Above                    Optimal  130-159  mg/dL   Borderline  160-189  mg/dL   High  >190  mg/dL   Very High Performed at [facility], [place] [address] [place], [place] [address]   CBC     Status: Abnormal   Collection Time: 06/06/22  5:05 AM  Result Value Ref Range   WBC 11.0 (H) 4.0 - 10.5 K/uL   RBC 4.74 3.87 - 5.11 MIL/uL   Hemoglobin 13.0 12.0 - 15.0 g/dL   HCT 39.1 36.0 - 46.0 %   MCV 82.5 80.0 - 100.0 fL   MCH 27.4 26.0 - 34.0 pg   MCHC 33.2 30.0 - 36.0 g/dL   RDW 14.5 11.5 - 15.5 %   Platelets 229 150 - 400 K/uL   nRBC 0.0 0.0 - 0.2 %    Comment: Performed at [facility], [place] [address] [place], [place] [id number]  Basic metabolic panel     Status: Abnormal   Collection Time: 06/06/22  5:05 AM  Result Value Ref Range   Sodium 137 135 - 145 mmol/L   Potassium 3.9 3.5 - 5.1 mmol/L   Chloride 111 98 - 111 mmol/L   CO2 15 (L) 22 - 32 mmol/L   Glucose, Bld 93 70 - 99 mg/dL    Comment: Glucose reference range applies only to samples taken after fasting for at least 8 hours.   BUN 22 (H) 6 - 20 mg/dL   Creatinine, Ser 3.05 (H) 0.44 - 1.00 mg/dL   Calcium 8.3 (L) 8.9 - 10.3 mg/dL   GFR, Estimated 17 (L) >60 mL/min    Comment: (NOTE) Calculated using the CKD-EPI Creatinine Equation (2021)    Anion gap 11 5 - 15    Comment: Performed at [facility] [address] [place], [place] [address]   DG Chest 2 View  Result Date: 06/06/2022 CLINICAL DATA:  Chest pain. EXAM: CHEST - 2 VIEW COMPARISON:  10/31/2021 FINDINGS: Low lung volumes. Mild cardiomegaly is stable. Coronary artery stent. Trace fluid in the fissures without large subpulmonic effusion. No focal airspace disease. No pleural fluid or pneumothorax. No acute osseous abnormalities. Lower cervical spine hardware. IMPRESSION: Low lung volumes with mild  cardiomegaly. Trace fluid in the fissures. Electronically Signed   By: Keith Rake M.D.   On: 06/06/2022 00:00   DG Foot Complete Left  Result Date: 06/05/2022 CLINICAL DATA:  Foot swelling. EXAM: LEFT FOOT - COMPLETE 3+ VIEW COMPARISON:  None Available. FINDINGS: Hammertoe deformity of the digits. Mild dorsal spurring of the talonavicular joint. 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 No erosion, periostitis or bony destruction. No fracture. Plantar calcaneal spur and Achilles tendon enthesophyte. Dorsal soft tissue edema. IMPRESSION: 1. Dorsal soft tissue edema. No acute osseous abnormality. 2. Hammertoe deformity of the digits. Mild scattered osteoarthritis. Electronically Signed   By: Keith Rake M.D.   On: 06/05/2022 23:59    Pending Labs Unresulted Labs (From admission, onward)     Start     Ordered   06/07/22 0500  Heparin level (unfractionated)  Daily,   R     See Hyperspace for full Linked Orders Report.   06/06/22 0536   06/07/22 0500  CBC  Daily,   R     See Hyperspace for full Linked Orders Report.   06/06/22 0536   06/06/22 1400  Heparin level (unfractionated)  Once-Timed,   TIMED        06/06/22 0536   06/06/22 0500  Lipoprotein A (LPA)  Tomorrow morning,   R        06/06/22  0458            Vitals/Pain Today's Vitals   06/06/22 0630 06/06/22 0700 06/06/22 0900 06/06/22 1000  BP: (!) 157/82 (!) 168/85 (!) 147/102 (!) 148/119  Pulse: 80 76 73 67  Resp: (!) 26 19 (!) 23 (!) 21  Temp:  98.2 F (36.8 C)  98.2 F (36.8 C)  TempSrc:  Oral    SpO2: 95% 98% 99% 100%  Weight:  113 kg  113 kg  Height:  '5\' 3"'$  (1.6 m)  '5\' 3"'$  (1.6 m)  PainSc:        Isolation Precautions No active isolations  Medications Medications  aspirin EC tablet 81 mg (has no administration in time range)  nitroGLYCERIN (NITROSTAT) SL tablet 0.4 mg (has no administration in time range)  acetaminophen (TYLENOL) tablet 650 mg (has no administration in time range)   ondansetron (ZOFRAN) injection 4 mg (has no administration in time range)  sodium chloride flush (NS) 0.9 % injection 3 mL (3 mLs Intravenous Not Given 06/06/22 0907)  sodium chloride flush (NS) 0.9 % injection 3 mL (has no administration in time range)  0.9 %  sodium chloride infusion (has no administration in time range)  clopidogrel (PLAVIX) tablet 75 mg (75 mg Oral Given 06/06/22 0853)  carvedilol (COREG) tablet 3.125 mg (3.125 mg Oral Given 06/06/22 0853)  atorvastatin (LIPITOR) tablet 40 mg (40 mg Oral Given 06/06/22 0853)  albuterol (PROVENTIL) (2.5 MG/3ML) 0.083% nebulizer solution 3 mL (has no administration in time range)  allopurinol (ZYLOPRIM) tablet 100 mg (100 mg Oral Given 06/06/22 0853)  amLODipine (NORVASC) tablet 5 mg (5 mg Oral Given 06/06/22 0852)  isosorbide mononitrate (IMDUR) 24 hr tablet 60 mg (60 mg Oral Given 06/06/22 0856)  hydrALAZINE (APRESOLINE) tablet 25 mg (25 mg Oral Given 06/06/22 0506)  heparin ADULT infusion 100 units/mL (25000 units/268m) (1,100 Units/hr Intravenous New Bag/Given 06/06/22 0541)  influenza vac split quadrivalent PF (FLUARIX) injection 0.5 mL (has no administration in time range)  amLODipine (NORVASC) tablet 10 mg (10 mg Oral Given 06/05/22 2348)  nitroGLYCERIN (NITROGLYN) 2 % ointment 0.5 inch (0.5 inches Topical Given 06/06/22 0315)  acetaminophen (TYLENOL) tablet 650 mg (650 mg Oral Given 06/06/22 0319)  fentaNYL (SUBLIMAZE) injection 12.5 mcg (12.5 mcg Intravenous Given 06/06/22 0407)  heparin bolus via infusion 4,000 Units (4,000 Units Intravenous Bolus from Bag 06/06/22 0541)    Mobility walks     Focused Assessments Cardiac Assessment Handoff:    No results found for: "CKTOTAL", "CKMB", "CKMBINDEX", "TROPONINI" No results found for: "DDIMER" Does the Patient currently have chest pain? No    R Recommendations: See Admitting Provider Note  Report given to:   Additional Notes: hepin drip, redraw @ 1400

## 2022-06-06 NOTE — ED Provider Notes (Signed)
[facility] Provider Note   CSN: [id number] Arrival date & time: 06/05/22  2307     History  Chief Complaint  Patient presents with   Chest Pain    Colleen Curtis is a 57 y.o. female who presents with concern for substernal chest pain that radiates to her back that started around 1 pm today whil watching TV. Worse with exertion, radiates down the left arm, some associated SOB.   Pt with hx of stenting, supposed to be on ASA and plavix, as well as antihypertensive medications, however noncompliant with all meds because "I just don't take medicine". Pain somewhat improved with sublingual nitro, ASA given by EMS.   Has unrelated left foot pain x 3 days, history of gout.  No injury to the foot, no history of wounds to the foot.  No history of diabetes.  I have reviewed her medical records, HX of PE, MI, CVT, CAD s/p stenting, and CKD stage 3, COPD, seizures supposed to be on keppra, hx of brain tumor in the past..   HPI     Home Medications Prior to Admission medications   Medication Sig Start Date End Date Taking? Authorizing Provider  acetaminophen (TYLENOL) 325 MG tablet Take 2 tablets (650 mg total) by mouth every 4 (four) hours as needed for headache or mild pain. 11/29/20   Dixie Dials, MD  acetaminophen-codeine (TYLENOL #3) 300-30 MG tablet Take 1 tablet by mouth every 6 (six) hours as needed for moderate pain. 10/31/21   Jeanell Sparrow, DO  albuterol (VENTOLIN HFA) 108 (90 Base) MCG/ACT inhaler Inhale 1 puff into the lungs daily as needed for wheezing or shortness of breath.    [provider]  allopurinol (ZYLOPRIM) 100 MG tablet Take 1 tablet (100 mg total) by mouth daily. Patient not taking: Reported on 10/31/2021 11/29/20   Dixie Dials, MD  amLODipine (NORVASC) 5 MG tablet Take 1 tablet (5 mg total) by mouth daily. Patient not taking: Reported on 10/31/2021 11/29/20   Dixie Dials, MD  amoxicillin-clavulanate  (AUGMENTIN) 875-125 MG tablet Take 1 tablet by mouth every 12 (twelve) hours. 10/31/21   Jeanell Sparrow, DO  aspirin 81 MG EC tablet Take 1 tablet (81 mg total) by mouth daily. Swallow whole. Patient not taking: Reported on 10/31/2021 11/29/20   Dixie Dials, MD  atorvastatin (LIPITOR) 80 MG tablet Take 1 tablet (80 mg total) by mouth at bedtime. Patient not taking: Reported on 10/31/2021 11/29/20   Dixie Dials, MD  azithromycin (ZITHROMAX) 250 MG tablet Take 1 tablet (250 mg total) by mouth daily. Take first 2 tablets together, then 1 every day until finished. 10/31/21   Jeanell Sparrow, DO  carvedilol (COREG) 12.5 MG tablet Take 1 tablet (12.5 mg total) by mouth 2 (two) times daily with a meal. Patient not taking: Reported on 10/31/2021 11/29/20   Dixie Dials, MD  clopidogrel (PLAVIX) 75 MG tablet Take 1 tablet (75 mg total) by mouth daily. Patient not taking: Reported on 10/31/2021 11/29/20   Dixie Dials, MD  isosorbide mononitrate (IMDUR) 60 MG 24 hr tablet Take 1 tablet (60 mg total) by mouth daily. Patient not taking: Reported on 10/31/2021 11/29/20   Dixie Dials, MD  nitroGLYCERIN (NITROSTAT) 0.4 MG SL tablet Place 0.4 mg under the tongue every 5 (five) minutes as needed for chest pain. Patient not taking: Reported on 10/31/2021    [provider]  ondansetron (ZOFRAN) 4 MG tablet Take 1 tablet (4 mg  total) by mouth every 4 (four) hours as needed for nausea or vomiting. 10/31/21   Jeanell Sparrow, DO  ranolazine (RANEXA) 1000 MG SR tablet Take 1 tablet (1,000 mg total) by mouth in the morning and at bedtime. Patient not taking: Reported on 10/31/2021 11/29/20   Dixie Dials, MD      Allergies    Patient has no known allergies.    Review of Systems   Review of Systems  Constitutional: Negative.   HENT: Negative.    Respiratory:  Positive for shortness of breath.   Cardiovascular:  Positive for chest pain and palpitations. Negative for leg swelling.  Gastrointestinal: Negative.    Musculoskeletal:        Left foot pain  Neurological: Negative.     Physical Exam Updated Vital Signs BP (!) 160/86   Pulse 69   Temp 98 F (36.7 C) (Oral)   Resp (!) 21   Ht '5\' 3"'$  (1.6 m)   Wt 113.4 kg   SpO2 99%   BMI 44.29 kg/m  Physical Exam Vitals and nursing note reviewed.  Constitutional:      Appearance: She is obese. She is not ill-appearing or toxic-appearing.  HENT:     Head: Normocephalic and atraumatic.     Mouth/Throat:     Mouth: Mucous membranes are moist.     Pharynx: No oropharyngeal exudate or posterior oropharyngeal erythema.  Eyes:     General:        Right eye: No discharge.        Left eye: No discharge.     Conjunctiva/sclera: Conjunctivae normal.  Cardiovascular:     Rate and Rhythm: Normal rate and regular rhythm.     Pulses: Normal pulses.     Heart sounds: Normal heart sounds. No murmur heard. Pulmonary:     Effort: Pulmonary effort is normal. No respiratory distress.     Breath sounds: Normal breath sounds. No wheezing or rales.  Chest:     Chest wall: No mass, tenderness or edema.  Abdominal:     General: Bowel sounds are normal. There is no distension.     Palpations: Abdomen is soft.     Tenderness: There is no abdominal tenderness.  Musculoskeletal:        General: No deformity.     Cervical back: Neck supple.     Right lower leg: No edema.     Left lower leg: No edema.       Feet:  Skin:    General: Skin is warm and dry.     Capillary Refill: Capillary refill takes less than 2 seconds.  Neurological:     General: No focal deficit present.     Mental Status: She is alert and oriented to person, place, and time. Mental status is at baseline.  Psychiatric:        Mood and Affect: Mood normal.     ED Results / Procedures / Treatments   Labs (all labs ordered are listed, but only abnormal results are displayed) Labs Reviewed  BRAIN NATRIURETIC PEPTIDE - Abnormal; Notable for the following components:      Result Value    B Natriuretic Peptide 385.9 (*)    All other components within normal limits  COMPREHENSIVE METABOLIC PANEL - Abnormal; Notable for the following components:   Chloride 112 (*)    CO2 13 (*)    Glucose, Bld 106 (*)    BUN 23 (*)    Creatinine, Ser 3.25 (*)  Calcium 8.5 (*)    Total Protein 6.4 (*)    Albumin 3.2 (*)    GFR, Estimated 16 (*)    All other components within normal limits  CBC WITH DIFFERENTIAL/PLATELET - Abnormal; Notable for the following components:   WBC 11.3 (*)    All other components within normal limits  HCG, QUANTITATIVE, PREGNANCY - Abnormal; Notable for the following components:   hCG, Beta Chain, Quant, S 6 (*)    All other components within normal limits  CBC - Abnormal; Notable for the following components:   WBC 11.0 (*)    All other components within normal limits  I-STAT BETA HCG BLOOD, ED ([facility], [facility], [facility] ONLY) - Abnormal; Notable for the following components:   I-stat hCG, quantitative 5.5 (*)    All other components within normal limits  TROPONIN I (HIGH SENSITIVITY) - Abnormal; Notable for the following components:   Troponin I (High Sensitivity) 91 (*)    All other components within normal limits  TROPONIN I (HIGH SENSITIVITY) - Abnormal; Notable for the following components:   Troponin I (High Sensitivity) 80 (*)    All other components within normal limits  LIPASE, BLOOD  HIV ANTIBODY (ROUTINE TESTING W REFLEX)  LIPOPROTEIN A (LPA)  LIPID PANEL  BASIC METABOLIC PANEL  HEPARIN LEVEL (UNFRACTIONATED)    EKG EKG Interpretation  Date/Time:  Wednesday June 05 2022 23:16:17 EST Ventricular Rate:  85 PR Interval:  164 QRS Duration: 88 QT Interval:  394 QTC Calculation: 468 R Axis:   53 Text Interpretation: Normal sinus rhythm Possible Left atrial enlargement Low voltage QRS no sig change from previous When compared with ECG of 31-Oct-2021 12:19, PREVIOUS ECG IS PRESENT Confirmed by Charlesetta Shanks [phone]) on 06/05/2022 11:41:36  PM  Radiology DG Chest 2 View  Result Date: 06/06/2022 CLINICAL DATA:  Chest pain. EXAM: CHEST - 2 VIEW COMPARISON:  10/31/2021 FINDINGS: Low lung volumes. Mild cardiomegaly is stable. Coronary artery stent. Trace fluid in the fissures without large subpulmonic effusion. No focal airspace disease. No pleural fluid or pneumothorax. No acute osseous abnormalities. Lower cervical spine hardware. IMPRESSION: Low lung volumes with mild cardiomegaly. Trace fluid in the fissures. Electronically Signed   By: Keith Rake M.D.   On: 06/06/2022 00:00   DG Foot Complete Left  Result Date: 06/05/2022 CLINICAL DATA:  Foot swelling. EXAM: LEFT FOOT - COMPLETE 3+ VIEW COMPARISON:  None Available. FINDINGS: Hammertoe deformity of the digits. Mild dorsal spurring of the talonavicular joint. 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 No erosion, periostitis or bony destruction. No fracture. Plantar calcaneal spur and Achilles tendon enthesophyte. Dorsal soft tissue edema. IMPRESSION: 1. Dorsal soft tissue edema. No acute osseous abnormality. 2. Hammertoe deformity of the digits. Mild scattered osteoarthritis. Electronically Signed   By: Keith Rake M.D.   On: 06/05/2022 23:59    Procedures Procedures    Medications Ordered in ED Medications  aspirin EC tablet 81 mg (has no administration in time range)  nitroGLYCERIN (NITROSTAT) SL tablet 0.4 mg (has no administration in time range)  acetaminophen (TYLENOL) tablet 650 mg (has no administration in time range)  ondansetron (ZOFRAN) injection 4 mg (has no administration in time range)  sodium chloride flush (NS) 0.9 % injection 3 mL (has no administration in time range)  sodium chloride flush (NS) 0.9 % injection 3 mL (has no administration in time range)  0.9 %  sodium chloride infusion (has no administration in time range)  clopidogrel (PLAVIX) tablet 75 mg (has  no administration in time range)  carvedilol (COREG) tablet 3.125 mg  (has no administration in time range)  atorvastatin (LIPITOR) tablet 40 mg (has no administration in time range)  albuterol (PROVENTIL) (2.5 MG/3ML) 0.083% nebulizer solution 3 mL (has no administration in time range)  allopurinol (ZYLOPRIM) tablet 100 mg (has no administration in time range)  amLODipine (NORVASC) tablet 5 mg (has no administration in time range)  isosorbide mononitrate (IMDUR) 24 hr tablet 60 mg (has no administration in time range)  hydrALAZINE (APRESOLINE) tablet 25 mg (25 mg Oral Given 06/06/22 0506)  heparin ADULT infusion 100 units/mL (25000 units/248m) (1,100 Units/hr Intravenous New Bag/Given 06/06/22 0541)  amLODipine (NORVASC) tablet 10 mg (10 mg Oral Given 06/05/22 2348)  nitroGLYCERIN (NITROGLYN) 2 % ointment 0.5 inch (0.5 inches Topical Given 06/06/22 0315)  acetaminophen (TYLENOL) tablet 650 mg (650 mg Oral Given 06/06/22 0319)  fentaNYL (SUBLIMAZE) injection 12.5 mcg (12.5 mcg Intravenous Given 06/06/22 0407)  heparin bolus via infusion 4,000 Units (4,000 Units Intravenous Bolus from Bag 06/06/22 0541)    ED Course/ Medical Decision Making/ A&P Clinical Course as of 06/06/22 [id number] Thu Jun 06, 2022  0411 Consult to cardiologist Dr. KDoylene Canard who has catheterized the patient in the past. He will present to the ED to evaluate the patient and communicate dispo plan following in person evaluation. I appreciate his collaboration in the care of this patient.  [RS]  0508 Patient admitted to cardiology service under the care of Dr. KDoylene Canard I appreciate his collaboration in the care of his patient.  [RS]    Clinical Course User Index [RS] Unknown Flannigan, RGypsy Balsam PA-C                             Medical Decision Making 57y/o female who presents with concern for CP, left foot pain.   HTN on intake, VS otherwise normal. Cardiopulmonary exam is normal, abdominal exam is benign. No LE edema. Mild soft tissue swelling over the first MTP on the right foot, associated erythema  without induration or wound.   DDX for CP includes but is not limited to ACS, PE, pleural effusion, dysrhythmia, HTN emergency.   Amount and/or Complexity of Data Reviewed Labs: ordered.    Details: CBC with very mild leukocytosis of 11,000, no anemia.  CMP with AKI with creatinine of 3.2 increased from patient's baseline of 2.5.  Normal potassium.  BNP elevated to 385, troponin elevated to 90, downtrending on repeat to 80. Radiology: ordered.  Risk OTC drugs. Prescription drug management. Decision regarding hospitalization.   Patient evaluated the bedside by cardiologist Dr. KDoylene Canard,  Started on heparin and admitted to his service for ACS.  I appreciate his collaboration in the care of this patient.  SMarium voiced understanding of her medical evaluation and treatment plan. Each of their questions answered to their expressed satisfaction. She is amenable to plan for admission at this time.  This chart was dictated using voice recognition software, Dragon. Despite the best efforts of this provider to proofread and correct errors, errors may still occur which can change documentation meaning.  Final Clinical Impression(s) / ED Diagnoses Final diagnoses:  Nontoxic goiter, unspecified    Rx / DC Orders ED Discharge Orders     None         SAura Dials02/29/24 [id number]   WRipley Fraise MD 06/07/22 0100

## 2022-06-06 NOTE — Plan of Care (Signed)

## 2022-06-06 NOTE — Consult Note (Signed)
[facility] Renal Consultation Note  Requesting MD: [id number] Indication for Consultation: progressive CKD  HPI:  Solmarie Hilgendorf is a 57 y.o. female with past medical history significant for bipolar d/o, HTN-  poorly controlled, tobacco abuse, obesity, CAD, cardiomyopathy, hx CVA and sz d/o-  also medical noncompliance. She also appears to have progressive CKD-  crt 1.8 in December 21 and has worsened slowly with time-  2.2-2.5 in August of 22- still 2.5 in July of 23.  Now presented with CP-  found to have an elevated troponin and now a crt of 3.25. Cardiology is seeing but not sure what the plan is-  we are asked to see for CKD.  Pt tells me that she has never been told she had an issue with her kidneys-  she does not have a PCP- only has seen Dr. Doylene Canard on occasion- admits that does not take meds-  still smokes-  no current urinary sxms-  reports a remote history of one kidney stone passed on own.  Says that she gets frequent "UTIS' but just drinks more water and they get better.  She does take NSAIDS but it doesn't sound like a lot.  She denies usual edema but right now has a swollen left foot that could certainly be consistent with gout-  uric acid level  is pending.  She is very distressed that her kidney function is as bad as it is.  Says that she has been having some nausea and fatigue the last few weeks but difficult to tell if this is due to kidneys as her BUN is in the 20's-  cleaned her lunch tray.  Crt down a little to 3.05 today   Creatinine, Ser  Date/Time Value Ref Range Status  06/06/2022 05:05 AM 3.05 (H) 0.44 - 1.00 mg/dL Final  06/05/2022 11:44 PM 3.25 (H) 0.44 - 1.00 mg/dL Final  10/31/2021 01:58 PM 2.54 (H) 0.44 - 1.00 mg/dL Final  11/29/2020 02:15 AM 2.49 (H) 0.44 - 1.00 mg/dL Final  11/28/2020 01:41 AM 2.38 (H) 0.44 - 1.00 mg/dL Final  11/27/2020 06:00 PM 2.25 (H) 0.44 - 1.00 mg/dL Final  11/27/2020 08:09 AM 2.22 (H) 0.44 - 1.00 mg/dL Final  11/26/2020  03:26 AM 2.17 (H) 0.44 - 1.00 mg/dL Final  11/25/2020 06:02 AM 1.96 (H) 0.44 - 1.00 mg/dL Final  04/05/2020 02:42 AM 1.88 (H) 0.44 - 1.00 mg/dL Final  04/04/2020 06:45 PM 1.83 (H) 0.44 - 1.00 mg/dL Final     PMHx:   Past Medical History:  Diagnosis Date   Bipolar disorder ([place])    Cancer ([place])    COPD (chronic obstructive pulmonary disease) ([place])    Coronary artery disease    Diverticulitis    Hypertension    Myocardial infarction ([place])    Seizure ([place])    Stroke ([place])     Past Surgical History:  Procedure Laterality Date   ABDOMINAL SURGERY     BRAIN SURGERY     CHOLECYSTECTOMY     LEFT HEART CATH AND CORONARY ANGIOGRAPHY N/A 11/27/2020   Procedure: LEFT HEART CATH AND CORONARY ANGIOGRAPHY;  Surgeon: Dixie Dials, MD;  Location: [facility];  Service: Cardiovascular;  Laterality: N/A;    Family Hx:  Family History  Problem Relation Age of Onset   Heart disease Mother    Hypertension Mother    Heart disease Father    Diabetes Mellitus II Father    Arrhythmia Brother     Social History:  reports that  she has been smoking cigars. She has never used smokeless tobacco. She reports current drug use. Drug: Marijuana. She reports that she does not drink alcohol.  Allergies: No Known Allergies  Medications: Prior to Admission medications   Medication Sig Start Date End Date Taking? Authorizing Provider  acetaminophen (TYLENOL) 325 MG tablet Take 2 tablets (650 mg total) by mouth every 4 (four) hours as needed for headache or mild pain. 11/29/20   Dixie Dials, MD  acetaminophen-codeine (TYLENOL #3) 300-30 MG tablet Take 1 tablet by mouth every 6 (six) hours as needed for moderate pain. 10/31/21   Jeanell Sparrow, DO  albuterol (VENTOLIN HFA) 108 (90 Base) MCG/ACT inhaler Inhale 1 puff into the lungs daily as needed for wheezing or shortness of breath.    [provider]  allopurinol (ZYLOPRIM) 100 MG tablet Take 1 tablet (100 mg total) by mouth daily. Patient  not taking: Reported on 10/31/2021 11/29/20   Dixie Dials, MD  amLODipine (NORVASC) 5 MG tablet Take 1 tablet (5 mg total) by mouth daily. Patient not taking: Reported on 10/31/2021 11/29/20   Dixie Dials, MD  amoxicillin-clavulanate (AUGMENTIN) 875-125 MG tablet Take 1 tablet by mouth every 12 (twelve) hours. 10/31/21   Jeanell Sparrow, DO  aspirin 81 MG EC tablet Take 1 tablet (81 mg total) by mouth daily. Swallow whole. Patient not taking: Reported on 10/31/2021 11/29/20   Dixie Dials, MD  atorvastatin (LIPITOR) 80 MG tablet Take 1 tablet (80 mg total) by mouth at bedtime. Patient not taking: Reported on 10/31/2021 11/29/20   Dixie Dials, MD  azithromycin (ZITHROMAX) 250 MG tablet Take 1 tablet (250 mg total) by mouth daily. Take first 2 tablets together, then 1 every day until finished. 10/31/21   Jeanell Sparrow, DO  carvedilol (COREG) 12.5 MG tablet Take 1 tablet (12.5 mg total) by mouth 2 (two) times daily with a meal. Patient not taking: Reported on 10/31/2021 11/29/20   Dixie Dials, MD  clopidogrel (PLAVIX) 75 MG tablet Take 1 tablet (75 mg total) by mouth daily. Patient not taking: Reported on 10/31/2021 11/29/20   Dixie Dials, MD  isosorbide mononitrate (IMDUR) 60 MG 24 hr tablet Take 1 tablet (60 mg total) by mouth daily. Patient not taking: Reported on 10/31/2021 11/29/20   Dixie Dials, MD  nitroGLYCERIN (NITROSTAT) 0.4 MG SL tablet Place 0.4 mg under the tongue every 5 (five) minutes as needed for chest pain. Patient not taking: Reported on 10/31/2021    [provider]  ondansetron (ZOFRAN) 4 MG tablet Take 1 tablet (4 mg total) by mouth every 4 (four) hours as needed for nausea or vomiting. 10/31/21   Jeanell Sparrow, DO  ranolazine (RANEXA) 1000 MG SR tablet Take 1 tablet (1,000 mg total) by mouth in the morning and at bedtime. Patient not taking: Reported on 10/31/2021 11/29/20   Dixie Dials, MD    I have reviewed the patient's current medications.  Labs:  Results for  orders placed or performed during the hospital encounter of 06/05/22 (from the past 48 hour(s))  Brain natriuretic peptide     Status: Abnormal   Collection Time: 06/05/22 11:44 PM  Result Value Ref Range   B Natriuretic Peptide 385.9 (H) 0.0 - 100.0 pg/mL    Comment: Performed at [facility] [address], [place], [place] [address]  Comprehensive metabolic panel     Status: Abnormal   Collection Time: 06/05/22 11:44 PM  Result Value Ref Range   Sodium 138 135 -  145 mmol/L   Potassium 4.2 3.5 - 5.1 mmol/L   Chloride 112 (H) 98 - 111 mmol/L   CO2 13 (L) 22 - 32 mmol/L   Glucose, Bld 106 (H) 70 - 99 mg/dL    Comment: Glucose reference range applies only to samples taken after fasting for at least 8 hours.   BUN 23 (H) 6 - 20 mg/dL   Creatinine, Ser 3.25 (H) 0.44 - 1.00 mg/dL   Calcium 8.5 (L) 8.9 - 10.3 mg/dL   Total Protein 6.4 (L) 6.5 - 8.1 g/dL   Albumin 3.2 (L) 3.5 - 5.0 g/dL   AST 15 15 - 41 U/L   ALT 9 0 - 44 U/L   Alkaline Phosphatase 70 38 - 126 U/L   Total Bilirubin 0.6 0.3 - 1.2 mg/dL   GFR, Estimated 16 (L) >60 mL/min    Comment: (NOTE) Calculated using the CKD-EPI Creatinine Equation (2021)    Anion gap 13 5 - 15    Comment: Performed at [facility], [place] [address] [place], [place] [address]  Lipase, blood     Status: None   Collection Time: 06/05/22 11:44 PM  Result Value Ref Range   Lipase 47 11 - 51 U/L    Comment: Performed at [facility] [address], [place], [place] [address]  CBC with Differential     Status: Abnormal   Collection Time: 06/05/22 11:44 PM  Result Value Ref Range   WBC 11.3 (H) 4.0 - 10.5 K/uL   RBC 4.97 3.87 - 5.11 MIL/uL   Hemoglobin 13.4 12.0 - 15.0 g/dL   HCT 41.2 36.0 - 46.0 %   MCV 82.9 80.0 - 100.0 fL   MCH 27.0 26.0 - 34.0 pg   MCHC 32.5 30.0 - 36.0 g/dL   RDW 14.5 11.5 - 15.5 %   Platelets 243 150 - 400 K/uL   nRBC 0.0 0.0 - 0.2 %   Neutrophils Relative % 67 %   Neutro Abs 7.5 1.7 - 7.7 K/uL    Lymphocytes Relative 24 %   Lymphs Abs 2.7 0.7 - 4.0 K/uL   Monocytes Relative 6 %   Monocytes Absolute 0.7 0.1 - 1.0 K/uL   Eosinophils Relative 2 %   Eosinophils Absolute 0.3 0.0 - 0.5 K/uL   Basophils Relative 1 %   Basophils Absolute 0.1 0.0 - 0.1 K/uL   Immature Granulocytes 0 %   Abs Immature Granulocytes 0.03 0.00 - 0.07 K/uL    Comment: Performed at [facility] [address] [place], [place] [address]  Troponin I (High Sensitivity)     Status: Abnormal   Collection Time: 06/05/22 11:44 PM  Result Value Ref Range   Troponin I (High Sensitivity) 91 (H) <18 ng/L    Comment: (NOTE) Elevated high sensitivity troponin I (hsTnI) values and significant  changes across serial measurements may suggest ACS but many other  chronic and acute conditions are known to elevate hsTnI results.  Refer to the "Links" section for chest pain algorithms and additional  guidance. Performed at [facility], [place] [address] [place], [place] [address]   I-Stat beta hCG blood, ED     Status: Abnormal   Collection Time: 06/06/22 12:19 AM  Result Value Ref Range   I-stat hCG, quantitative 5.5 (H) <5 mIU/mL   Comment 3            Comment:   GEST. AGE      CONC.  (  mIU/mL)   <=1 WEEK        5 - 50     2 WEEKS       50 - 500     3 WEEKS       100 - 10,000     4 WEEKS     1,000 - 30,000        FEMALE AND NON-PREGNANT FEMALE:     LESS THAN 5 mIU/mL   Troponin I (High Sensitivity)     Status: Abnormal   Collection Time: 06/06/22  2:10 AM  Result Value Ref Range   Troponin I (High Sensitivity) 80 (H) <18 ng/L    Comment: (NOTE) Elevated high sensitivity troponin I (hsTnI) values and significant  changes across serial measurements may suggest ACS but many other  chronic and acute conditions are known to elevate hsTnI results.  Refer to the "Links" section for chest pain algorithms and additional  guidance. Performed at [facility], [place] [address] [place], [place] [address]    hCG, quantitative, pregnancy     Status: Abnormal   Collection Time: 06/06/22  2:28 AM  Result Value Ref Range   hCG, Beta Chain, Quant, S 6 (H) <5 mIU/mL    Comment:          GEST. AGE      CONC.  (mIU/mL)   <=1 WEEK        5 - 50     2 WEEKS       50 - 500     3 WEEKS       100 - 10,000     4 WEEKS     1,000 - 30,000     5 WEEKS     3,500 - 115,000   6-8 WEEKS     12,000 - 270,000    12 WEEKS     15,000 - 220,000        FEMALE AND NON-PREGNANT FEMALE:     LESS THAN 5 mIU/mL Performed at [facility], [place] [address], [place], [place] [address]   HIV Antibody (routine testing w rflx)     Status: None   Collection Time: 06/06/22  5:05 AM  Result Value Ref Range   HIV Screen 4th Generation wRfx Non Reactive Non Reactive    Comment: Performed at [facility], [place] [address], [place], [place] [address]  Lipid panel     Status: Abnormal   Collection Time: 06/06/22  5:05 AM  Result Value Ref Range   Cholesterol 261 (H) 0 - 200 mg/dL   Triglycerides 223 (H) <150 mg/dL   HDL 43 >40 mg/dL   Total CHOL/HDL Ratio 6.1 RATIO   VLDL 45 (H) 0 - 40 mg/dL   LDL Cholesterol 173 (H) 0 - 99 mg/dL    Comment:        Total Cholesterol/HDL:CHD Risk Coronary Heart Disease Risk Table                     Men   Women  1/2 Average Risk   3.4   3.3  Average Risk       5.0   4.4  2 X Average Risk   9.6   7.1  3 X Average Risk  23.4   11.0        Use the calculated Patient Ratio above and the CHD Risk Table to determine the patient's CHD Risk.  ATP III CLASSIFICATION (LDL):  <100     mg/dL   Optimal  100-129  mg/dL   Near or Above                    Optimal  130-159  mg/dL   Borderline  160-189  mg/dL   High  >190     mg/dL   Very High Performed at [facility] [address], [place], [place] [address]   CBC     Status: Abnormal   Collection Time: 06/06/22  5:05 AM  Result Value Ref Range   WBC 11.0 (H) 4.0 - 10.5 K/uL   RBC 4.74 3.87 - 5.11 MIL/uL   Hemoglobin  13.0 12.0 - 15.0 g/dL   HCT 39.1 36.0 - 46.0 %   MCV 82.5 80.0 - 100.0 fL   MCH 27.4 26.0 - 34.0 pg   MCHC 33.2 30.0 - 36.0 g/dL   RDW 14.5 11.5 - 15.5 %   Platelets 229 150 - 400 K/uL   nRBC 0.0 0.0 - 0.2 %    Comment: Performed at [facility], [place] [address] [place], [place] [id number]  Basic metabolic panel     Status: Abnormal   Collection Time: 06/06/22  5:05 AM  Result Value Ref Range   Sodium 137 135 - 145 mmol/L   Potassium 3.9 3.5 - 5.1 mmol/L   Chloride 111 98 - 111 mmol/L   CO2 15 (L) 22 - 32 mmol/L   Glucose, Bld 93 70 - 99 mg/dL    Comment: Glucose reference range applies only to samples taken after fasting for at least 8 hours.   BUN 22 (H) 6 - 20 mg/dL   Creatinine, Ser 3.05 (H) 0.44 - 1.00 mg/dL   Calcium 8.3 (L) 8.9 - 10.3 mg/dL   GFR, Estimated 17 (L) >60 mL/min    Comment: (NOTE) Calculated using the CKD-EPI Creatinine Equation (2021)    Anion gap 11 5 - 15    Comment: Performed at [facility] [address], [place], [place] [address]     ROS:  A comprehensive review of systems was negative except for: Musculoskeletal: positive for swollen and painful left foot.  Her CP she says is better  Physical Exam: Vitals:   06/06/22 1000 06/06/22 1141  BP: (!) 148/119 (!) 136/96  Pulse: 67 69  Resp: (!) 21 18  Temp: 98.2 F (36.8 C) 98.6 F (37 C)  SpO2: 100% 100%     General: obese, short-  sleeping initially but really NAD HEENT: PERRLA, EOMI, mucous membranes moist Neck: no JVD Heart: RRR Lungs: mostly clear Abdomen: obese, soft , non tender Extremities: no edema but left foot is swollen on the forefoot-  and tender to palpation Skin: warm and dry Neuro: alert and non focal  Assessment/Plan: 57 year old BF with HTN , hyperlipidemia and CAD who has been noncompliant with meds and has no PCP-  also has CKD present since 2021 that appears to be progressing  1.Renal- progressive CKD as above- no U/A in the system-  will check a U/A and  check for proteinuria, check a renal u/s and SPEP to work this up.  With common things being common this could just be CKD from longstanding and poorly controlled HTN as well as cardiac dz. Good to see that crt at least improved a little from yesterday to today.  Pt will need renal follow up assuming that kidney function does  not get to a dangerous range this hospitalization.  She may be a candidate for kidney protecting agents if she stabilizes and shows that she can follow up.  She reports fatigue and nausea but dont think these would be uremic sxms with BUN of 22-  no indications for dialysis at present but I did inform her of how severe this issue is 2. Hypertension/volume  - longstanding poorly controlled HTN-  has been started on norvasc and coreg-  good agents to start with  3. Anemia  - actually not an issue 4. Hyperlipidemia-  agree with statin 5. Gout-  this can play a role in CKD as well-  uric acid being checked -  has also been started on colchicine   Louis Meckel 06/06/2022, 1:43 PM

## 2022-06-06 NOTE — ED Notes (Signed)
Report called to susan RN

## 2022-06-06 NOTE — H&P (Signed)
Referring Physician: Penni Bombard, MD/Rebekah Sponseller, PA-C  Colleen Curtis is an 57 y.o. female.                       Chief Complaint: Chest pain  HPI: 57 years old black female with PMH of CAD, s/p 4 stents placed in [place], uncontrolled HTN, tobacco use disorder, morbid obesity, stroke, seizures and non-obstructive hypertrophic cardiomyopathy has chest pain resolving with SL NTG and aspirin use. She admits to dietary and medication non-compliance and has not used her antihypertensive medications for over 1 year as she do not like their non-specific side effects. Her Troponin I is minimally elevated and her BNP is elevated at 385.9 pg. Her creatinine is elevated at 3.25 mg with baseline of 2.22 to 2.54 mg. Her cardiac cath on 11/27/2020 showed multivessel CAD, in-stent mild restenosis and proximal LCx lesion with overlapping stents in OM branch interfering with access to the lesion. She also has left foot edema with hammertoe deformity of digits.  Past Medical History:  Diagnosis Date   Bipolar disorder ([place])    Cancer ([place])    COPD (chronic obstructive pulmonary disease) (HCC)    Coronary artery disease    Diverticulitis    Hypertension    Myocardial infarction ([place])    Seizure ([place])    Stroke [facility])       Past Surgical History:  Procedure Laterality Date   ABDOMINAL SURGERY     BRAIN SURGERY     CHOLECYSTECTOMY     LEFT HEART CATH AND CORONARY ANGIOGRAPHY N/A 11/27/2020   Procedure: LEFT HEART CATH AND CORONARY ANGIOGRAPHY;  Surgeon: Dixie Dials, MD;  Location: [facility];  Service: Cardiovascular;  Laterality: N/A;    Family History  Problem Relation Age of Onset   Heart disease Mother    Hypertension Mother    Heart disease Father    Diabetes Mellitus II Father    Arrhythmia Brother    Social History:  reports that she has been smoking cigars. She has never used smokeless tobacco. She reports current drug use. Drug: Marijuana. She reports that she does not  drink alcohol.  Allergies: No Known Allergies  (Not in a hospital admission)   Results for orders placed or performed during the hospital encounter of 06/05/22 (from the past 48 hour(s))  Brain natriuretic peptide     Status: Abnormal   Collection Time: 06/05/22 11:44 PM  Result Value Ref Range   B Natriuretic Peptide 385.9 (H) 0.0 - 100.0 pg/mL    Comment: Performed at [facility], [address], [place], [place] [address]  Comprehensive metabolic panel     Status: Abnormal   Collection Time: 06/05/22 11:44 PM  Result Value Ref Range   Sodium 138 135 - 145 mmol/L   Potassium 4.2 3.5 - 5.1 mmol/L   Chloride 112 (H) 98 - 111 mmol/L   CO2 13 (L) 22 - 32 mmol/L   Glucose, Bld 106 (H) 70 - 99 mg/dL    Comment: Glucose reference range applies only to samples taken after fasting for at least 8 hours.   BUN 23 (H) 6 - 20 mg/dL   Creatinine, Ser 3.25 (H) 0.44 - 1.00 mg/dL   Calcium 8.5 (L) 8.9 - 10.3 mg/dL   Total Protein 6.4 (L) 6.5 - 8.1 g/dL   Albumin 3.2 (L) 3.5 - 5.0 g/dL   AST 15 15 - 41 U/L   ALT 9 0 - 44 U/L  Alkaline Phosphatase 70 38 - 126 U/L   Total Bilirubin 0.6 0.3 - 1.2 mg/dL   GFR, Estimated 16 (L) >60 mL/min    Comment: (NOTE) Calculated using the CKD-EPI Creatinine Equation (2021)    Anion gap 13 5 - 15    Comment: Performed at [facility] [address], [place], [place] [address]  Lipase, blood     Status: None   Collection Time: 06/05/22 11:44 PM  Result Value Ref Range   Lipase 47 11 - 51 U/L    Comment: Performed at [facility] [address] [place], [place] [address]  CBC with Differential     Status: Abnormal   Collection Time: 06/05/22 11:44 PM  Result Value Ref Range   WBC 11.3 (H) 4.0 - 10.5 K/uL   RBC 4.97 3.87 - 5.11 MIL/uL   Hemoglobin 13.4 12.0 - 15.0 g/dL   HCT 41.2 36.0 - 46.0 %   MCV 82.9 80.0 - 100.0 fL   MCH 27.0 26.0 - 34.0 pg   MCHC 32.5 30.0 - 36.0 g/dL   RDW 14.5 11.5 - 15.5 %   Platelets 243 150 - 400  K/uL   nRBC 0.0 0.0 - 0.2 %   Neutrophils Relative % 67 %   Neutro Abs 7.5 1.7 - 7.7 K/uL   Lymphocytes Relative 24 %   Lymphs Abs 2.7 0.7 - 4.0 K/uL   Monocytes Relative 6 %   Monocytes Absolute 0.7 0.1 - 1.0 K/uL   Eosinophils Relative 2 %   Eosinophils Absolute 0.3 0.0 - 0.5 K/uL   Basophils Relative 1 %   Basophils Absolute 0.1 0.0 - 0.1 K/uL   Immature Granulocytes 0 %   Abs Immature Granulocytes 0.03 0.00 - 0.07 K/uL    Comment: Performed at [facility] [address], [place], [place] [address]  Troponin I (High Sensitivity)     Status: Abnormal   Collection Time: 06/05/22 11:44 PM  Result Value Ref Range   Troponin I (High Sensitivity) 91 (H) <18 ng/L    Comment: (NOTE) Elevated high sensitivity troponin I (hsTnI) values and significant  changes across serial measurements may suggest ACS but many other  chronic and acute conditions are known to elevate hsTnI results.  Refer to the "Links" section for chest pain algorithms and additional  guidance. Performed at [facility], [place] [address] [place], [place] [address]   I-Stat beta hCG blood, ED     Status: Abnormal   Collection Time: 06/06/22 12:19 AM  Result Value Ref Range   I-stat hCG, quantitative 5.5 (H) <5 mIU/mL   Comment 3            Comment:   GEST. AGE      CONC.  (mIU/mL)   <=1 WEEK        5 - 50     2 WEEKS       50 - 500     3 WEEKS       100 - 10,000     4 WEEKS     1,000 - 30,000        FEMALE AND NON-PREGNANT FEMALE:     LESS THAN 5 mIU/mL   Troponin I (High Sensitivity)     Status: Abnormal   Collection Time: 06/06/22  2:10 AM  Result Value Ref Range   Troponin I (High Sensitivity) 80 (H) <18 ng/L    Comment: (NOTE) Elevated high sensitivity troponin I (  hsTnI) values and significant  changes across serial measurements may suggest ACS but many other  chronic and acute conditions are known to elevate hsTnI results.  Refer to the "Links" section for chest pain algorithms and  additional  guidance. Performed at [facility], [place] [address] [place], [place] [address]   hCG, quantitative, pregnancy     Status: Abnormal   Collection Time: 06/06/22  2:28 AM  Result Value Ref Range   hCG, Beta Chain, Quant, S 6 (H) <5 mIU/mL    Comment:          GEST. AGE      CONC.  (mIU/mL)   <=1 WEEK        5 - 50     2 WEEKS       50 - 500     3 WEEKS       100 - 10,000     4 WEEKS     1,000 - 30,000     5 WEEKS     3,500 - 115,000   6-8 WEEKS     12,000 - 270,000    12 WEEKS     15,000 - 220,000        FEMALE AND NON-PREGNANT FEMALE:     LESS THAN 5 mIU/mL Performed at [facility], [place] [address], [place], [place] [address]    DG Chest 2 View  Result Date: 06/06/2022 CLINICAL DATA:  Chest pain. EXAM: CHEST - 2 VIEW COMPARISON:  10/31/2021 FINDINGS: Low lung volumes. Mild cardiomegaly is stable. Coronary artery stent. Trace fluid in the fissures without large subpulmonic effusion. No focal airspace disease. No pleural fluid or pneumothorax. No acute osseous abnormalities. Lower cervical spine hardware. IMPRESSION: Low lung volumes with mild cardiomegaly. Trace fluid in the fissures. Electronically Signed   By: Keith Rake M.D.   On: 06/06/2022 00:00   DG Foot Complete Left  Result Date: 06/05/2022 CLINICAL DATA:  Foot swelling. EXAM: LEFT FOOT - COMPLETE 3+ VIEW COMPARISON:  None Available. FINDINGS: Hammertoe deformity of the digits. Mild dorsal spurring of the talonavicular joint. 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 No erosion, periostitis or bony destruction. No fracture. Plantar calcaneal spur and Achilles tendon enthesophyte. Dorsal soft tissue edema. IMPRESSION: 1. Dorsal soft tissue edema. No acute osseous abnormality. 2. Hammertoe deformity of the digits. Mild scattered osteoarthritis. Electronically Signed   By: Keith Rake M.D.   On: 06/05/2022 23:59    Review Of Systems Constitutional: No fever, chills, Chronic  weight gain. Eyes: No vision change, wears glasses. No discharge or pain. Ears: No hearing loss, No tinnitus. Respiratory: No asthma, COPD, pneumonias. Positive shortness of breath. No hemoptysis. Cardiovascular: Positive chest pain, palpitation, leg edema. Gastrointestinal: No nausea, vomiting, diarrhea, constipation. No GI bleed. No hepatitis. Genitourinary: No dysuria, hematuria, kidney stone. No incontinance. Neurological: No headache, positive stroke, seizures.  Psychiatry: No psych facility admission for anxiety, depression, suicide. No detox. Skin: No rash. Musculoskeletal: Positive joint pain, fibromyalgia, neck pain, back pain. Lymphadenopathy: No lymphadenopathy. Hematology: No anemia or easy bruising.   Blood pressure (!) 157/112, pulse 75, temperature 98 F (36.7 C), temperature source Oral, resp. rate 19, height '5\' 3"'$  (1.6 m), weight 113.4 kg, SpO2 99 %. Body mass index is 44.29 kg/m. General appearance: alert, cooperative, appears stated age and no distress Head: Normocephalic, atraumatic. Eyes: Brown eyes, pink conjunctiva, corneas clear.  Neck: No adenopathy, no carotid bruit, no JVD, supple, symmetrical, trachea midline and thyroid  not enlarged. Resp: Clear to auscultation bilaterally. Cardio: Regular rate and rhythm, S1, S2 normal, II/VI systolic murmur, no click, rub or gallop GI: Soft, non-tender; bowel sounds normal; no organomegaly. Extremities: Left foot edema and toes deformities, no cyanosis or clubbing. Skin: Warm and dry.  Neurologic: Alert and oriented X 3, normal strength. Normal coordination.  Assessment/Plan Acute coronary syndrome CAD S/P coronary stents Acute on chronic renal failure Morbid obesity Uncontrolled HTN Tobacco use disorder  Plan: Admit. IV heparin/ASA/Plavix. Resume antihypertensive medications. Renal consult in AM.  Time spent: Review of old records, Lab, x-rays, EKG, other cardiac tests, examination, discussion with  patient/PA over 70 minutes.  Birdie Riddle, MD  06/06/2022, 5:00 AM

## 2022-06-06 NOTE — Progress Notes (Signed)
ANTICOAGULATION CONSULT NOTE - Initial Consult  Pharmacy Consult for heparin Indication: chest pain/ACS  No Known Allergies  Patient Measurements: Height: '5\' 3"'$  (160 cm) Weight: 113.4 kg (250 lb) IBW/kg (Calculated) : 52.4 Heparin Dosing Weight: 80kg  Vital Signs: Temp: 98 F (36.7 C) (02/29 0353) Temp Source: Oral (02/29 0353) BP: 157/112 (02/29 0445) Pulse Rate: 75 (02/29 0445)  Labs: Recent Labs    06/05/22 2344 06/06/22 0210  HGB 13.4  --   HCT 41.2  --   PLT 243  --   CREATININE 3.25*  --   TROPONINIHS 91* 80*    Estimated Creatinine Clearance: 22.6 mL/min (A) (by C-G formula based on SCr of 3.25 mg/dL (H)).   Medical History: Past Medical History:  Diagnosis Date   Bipolar disorder ([place])    Cancer ([place])    COPD (chronic obstructive pulmonary disease) ([place])    Coronary artery disease    Diverticulitis    Hypertension    Myocardial infarction ([place])    Seizure ([place])    Stroke [facility])     Assessment: 57yo female with significant PMH and known medical noncompliance c/o substernal CP radiating to back, no relief w/ NTG prior to arrival to ED, to begin heparin.  Goal of Therapy:  Heparin level 0.3-0.7 units/ml Monitor platelets by anticoagulation protocol: Yes   Plan:  Heparin 4000 units IV bolus x1 followed by infusion at 1100 units/hr. Monitor heparin levels and CBC.  Wynona Neat, PharmD, BCPS  06/06/2022,5:20 AM

## 2022-06-07 ENCOUNTER — Other Ambulatory Visit (HOSPITAL_COMMUNITY): Payer: Self-pay

## 2022-06-07 LAB — RENAL FUNCTION PANEL
Albumin: 2.8 g/dL — ABNORMAL LOW (ref 3.5–5.0)
Anion gap: 14 (ref 5–15)
BUN: 27 mg/dL — ABNORMAL HIGH (ref 6–20)
CO2: 14 mmol/L — ABNORMAL LOW (ref 22–32)
Calcium: 8.1 mg/dL — ABNORMAL LOW (ref 8.9–10.3)
Chloride: 110 mmol/L (ref 98–111)
Creatinine, Ser: 3.31 mg/dL — ABNORMAL HIGH (ref 0.44–1.00)
GFR, Estimated: 15 mL/min — ABNORMAL LOW (ref >60)
Glucose, Bld: 87 mg/dL (ref 70–99)
Phosphorus: 4.3 mg/dL (ref 2.5–4.6)
Potassium: 3.9 mmol/L (ref 3.5–5.1)
Sodium: 138 mmol/L (ref 135–145)

## 2022-06-07 LAB — CBC
HCT: 36.3 % (ref 36.0–46.0)
Hemoglobin: 11.7 g/dL — ABNORMAL LOW (ref 12.0–15.0)
MCH: 26.8 pg (ref 26.0–34.0)
MCHC: 32.2 g/dL (ref 30.0–36.0)
MCV: 83.3 fL (ref 80.0–100.0)
Platelets: 237 10*3/uL (ref 150–400)
RBC: 4.36 MIL/uL (ref 3.87–5.11)
RDW: 14.6 % (ref 11.5–15.5)
WBC: 10.3 10*3/uL (ref 4.0–10.5)
nRBC: 0 % (ref 0.0–0.2)

## 2022-06-07 LAB — ECHOCARDIOGRAM COMPLETE
Area-P 1/2: 3.37 cm2
Height: 63 in
S' Lateral: 4.1 cm
Weight: 3985.92 oz

## 2022-06-07 LAB — HEPARIN LEVEL (UNFRACTIONATED): Heparin Unfractionated: 0.29 IU/mL — ABNORMAL LOW (ref 0.30–0.70)

## 2022-06-07 MED ORDER — HYDRALAZINE HCL 25 MG PO TABS
25.0000 mg | ORAL_TABLET | Freq: Two times a day (BID) | ORAL | 3 refills | Status: DC
  Filled 2022-06-07: qty 60, 30d supply, fill #0

## 2022-06-07 MED ORDER — SODIUM BICARBONATE 650 MG PO TABS
1300.0000 mg | ORAL_TABLET | Freq: Two times a day (BID) | ORAL | Status: DC
  Administered 2022-06-07: 1300 mg via ORAL
  Filled 2022-06-07: qty 2

## 2022-06-07 MED ORDER — COLCHICINE 0.6 MG PO TABS
0.6000 mg | ORAL_TABLET | Freq: Every day | ORAL | 1 refills | Status: DC
  Filled 2022-06-07: qty 30, 30d supply, fill #0

## 2022-06-07 MED ORDER — CARVEDILOL 3.125 MG PO TABS
3.1250 mg | ORAL_TABLET | Freq: Two times a day (BID) | ORAL | 2 refills | Status: DC
  Filled 2022-06-07: qty 60, 30d supply, fill #0

## 2022-06-07 MED ORDER — SODIUM BICARBONATE 650 MG PO TABS
650.0000 mg | ORAL_TABLET | Freq: Two times a day (BID) | ORAL | 3 refills | Status: DC
  Filled 2022-06-07: qty 60, 30d supply, fill #0

## 2022-06-07 NOTE — Progress Notes (Deleted)
06/07/2022  Colleen Curtis DOB: 1965-07-04 MRN: [id number]   [name]  For the purposes of helping with transportation needs, [name] partners with outside transportation providers (taxi companies, [name], Social research officer, government.) to give [employer] patients or other approved people the choice of on-demand rides [name]") to our buildings for non-emergency visits.  By using [employer], I, the person signing this document, on behalf of myself and/or any legal minors (in my care using the [employer]), agree:  Government social research officer given to me are supplied by independent, outside transportation providers who do not work for, or have any affiliation with, [employer]. [name] is not a transportation company. [name] has no control over the quality or safety of the rides I get using [employer]. [name] has no control over whether any outside ride will happen on time or not. [name] gives no guarantee on the reliability, quality, safety, or availability on any rides, or that no mistakes will happen. I know and accept that traveling by vehicle (car, truck, SVU, Lucianne Lei, bus, taxi, etc.) has risks of serious injuries such as disability, being paralyzed, and death. I know and agree the risk of using [employer] is mine alone, and not [employer]. Transport Services are provided "as is" and as are available. The transportation providers are in charge for all inspections and care of the vehicles used to provide these rides. I agree not to take legal action against [name], its agents, employees, officers, directors, representatives, insurers, attorneys, assigns, successors, subsidiaries, and affiliates at any time for any reasons related directly or indirectly to using [employer]. I also agree not to take legal action against [name] or its affiliates for any injury, death, or damage to property caused by or related to  using [employer]. I have read this Waiver and Release of Liability, and I understand the terms used in it and their legal meaning. This Waiver is freely and voluntarily given with the understanding that my right (or any legal minors) to legal action against [name] relating to [employer] is knowingly given up to use these services.   I attest that I read the Ride Waiver and Release of Liability to Colleen Curtis, gave Ms. [name] the opportunity to ask questions and answered the questions asked (if any). I affirm that Colleen Curtis then provided consent for assistance with transportation.

## 2022-06-07 NOTE — Progress Notes (Signed)
ANTICOAGULATION CONSULT NOTE - Follow Up Consult  Pharmacy Consult for heparin Indication: chest pain/ACS  Labs: Recent Labs    06/05/22 2344 06/06/22 0210 06/06/22 0505 06/06/22 1500 06/07/22 0114  HGB 13.4  --  13.0  --  11.7*  HCT 41.2  --  39.1  --  36.3  PLT 243  --  229  --  237  HEPARINUNFRC  --   --   --  0.17* 0.29*  CREATININE 3.25*  --  3.05*  --   --   TROPONINIHS 91* 80*  --   --   --     Assessment: 57yo female remains slightly subtherapeutic on heparin after rate change; no infusion issues or signs of bleeding per RN.  Goal of Therapy:  Heparin level 0.3-0.7 units/ml   Plan:  Will increase heparin infusion by 1 unit/kg/hr to 1400 units/hr and check level in 8 hours.    Wynona Neat, PharmD, BCPS  06/07/2022,3:13 AM

## 2022-06-07 NOTE — Discharge Summary (Signed)
Physician Discharge Summary  Patient ID: Colleen Curtis MRN: [id number] DOB/AGE: 11-06-1965 57 y.o.  Admit date: 06/05/2022 Discharge date: 06/07/2022  Admission Diagnoses: Acute coronary syndrome CAD S/P coronary stents Acute on chronic renal failure Morbid obesity Uncontrolled HTN Tobacco use disorder  Discharge Diagnoses:  Principal Problem:   Acute coronary syndrome (HCC) Active problems:   CAD   S/P coronary stents   Non-obstructive hypertrophic cardiomyopathy   Ischemic cardiomyopathy   Acute on chronic renal failure, CKD, IV   Morbid obesity   Metabolic acidosis   HTN   Tobacco use disorder   Left foot gout   Left hammer toes deformity  Discharged Condition: fair  Hospital Course: 57 years old black female with PMH of CAD, s/p 4 stents placed in [place], uncontrolled HTN, tobacco use disorder, morbid obesity, stroke, seizures and non-obstructive hypertrophic cardiomyopathy had chest pain resolving with SL NTG and aspirin use. She admitted to dietary and medication non-compliance and has not used her antihypertensive medications for over 1 year as she do not like their non-specific side effects. Her Troponin I was minimally elevated and her BNP was elevated at 385.9 pg. Her creatinine was elevated at 3.25 mg with baseline of 2.22 to 2.54 mg. Her cardiac cath on 11/27/2020 showed multivessel CAD, in-stent mild restenosis and proximal LCx lesion with overlapping stents in OM branch interfering with access to the lesion. She also had left foot edema with hammertoe deformity of digits. The swelling mostly resolved with allopurinol and colchicine use. She had renal consult and was started on sodium bicarbonate which was decreased by half as she has ischemic cardiomyopathy with reduced LV EF.  Patient's blood pressure normalized and chest pain resolved with medical therapy. She understood to take medications regularly and start heart healthy diet and increase activity to lose  weight. She also understood to reduce protein intake by 50 % to control gout flare-ups. She will see me in 1 week and see renal doctors periodically.  Consults: nephrology  Significant Diagnostic Studies: labs: Near normal CBC, electrolytes and glucose. Creatinine was elevated at 3.25 mg. Uric acid was 7.5 mg. BNP was 385.9 pg/ Troponin I levels were minimally elevated.  EKG showed NSR.  CXR: Low lung volume and mild cardiomegaly.  Echocardiogram: LVH with moderate LV systolic dysfunction. LVEF 35-40 %.  Left foot x-ray was suggestive of gout and hammer toes deformity.  US renal: Medical renal disease.  Treatments: cardiac meds: carvedilol, amlodipine, Isosorbide. Aspirin, Plavix, Hydralazine and Atorvastatin. Also Sodium bicarbonate and allopurinol.  Discharge Exam: Blood pressure 128/79, pulse 66, temperature 98.7 F (37.1 C), temperature source Oral, resp. rate 17, height '5\' 3"'$  (1.6 m), weight 106.4 kg, SpO2 100 %. General appearance: alert, cooperative and appears stated age. Head: Normocephalic, atraumatic. Eyes: Brown eyes, pink conjunctiva, corneas clear.   Neck: No adenopathy, no carotid bruit, no JVD, supple, symmetrical, trachea midline and thyroid not enlarged. Resp: Clear to auscultation bilaterally. Cardio: Regular rate and rhythm, S1, S2 normal, II/VI systolic murmur, no click, rub or gallop. GI: Soft, non-tender; bowel sounds normal; no organomegaly. Extremities: Trace left foot edema, no cyanosis or clubbing. Skin: Warm and dry.  Neurologic: Alert and oriented X 3, normal strength and tone. Normal coordination and slow gait.  Disposition: Discharge disposition: 01-Home or Self Care        Allergies as of 06/07/2022   No Known Allergies      Medication List     STOP taking these medications    ranolazine 1000 MG  SR tablet Commonly known as: RANEXA       TAKE these medications    acetaminophen 325 MG tablet Commonly known as: TYLENOL Take 2  tablets (650 mg total) by mouth every 4 (four) hours as needed for headache or mild pain. What changed:  how much to take when to take this   albuterol 108 (90 Base) MCG/ACT inhaler Commonly known as: VENTOLIN HFA Inhale 1-2 puffs into the lungs daily as needed for wheezing or shortness of breath.   allopurinol 100 MG tablet Commonly known as: ZYLOPRIM Take 1 tablet (100 mg total) by mouth daily.   amLODipine 5 MG tablet Commonly known as: NORVASC Take 1 tablet (5 mg total) by mouth daily.   Aspirin Low Dose 81 MG tablet Generic drug: aspirin EC Take 1 tablet (81 mg total) by mouth daily. Swallow whole.   atorvastatin 80 MG tablet Commonly known as: LIPITOR Take 1 tablet (80 mg total) by mouth at bedtime.   carvedilol 3.125 MG tablet Commonly known as: COREG Take 1 tablet (3.125 mg total) by mouth 2 (two) times daily with a meal. What changed:  medication strength how much to take   clopidogrel 75 MG tablet Commonly known as: PLAVIX Take 1 tablet (75 mg total) by mouth daily.   colchicine 0.6 MG tablet Take 1 tablet (0.6 mg total) by mouth daily. Start taking on: June 08, 2022   hydrALAZINE 25 MG tablet Commonly known as: APRESOLINE Take 1 tablet (25 mg total) by mouth 2 (two) times daily.   isosorbide mononitrate 60 MG 24 hr tablet Commonly known as: IMDUR Take 1 tablet (60 mg total) by mouth daily.   nitroGLYCERIN 0.4 MG SL tablet Commonly known as: NITROSTAT Place 0.4 mg under the tongue every 5 (five) minutes as needed for chest pain.   sodium bicarbonate 650 MG tablet Take 1 tablet (650 mg total) by mouth 2 (two) times daily.        Follow-up Information     Dixie Dials, MD Follow up in 1 week(s).   Specialty: Cardiology Contact information: [place] [address] [id number]         Rosita Fire, MD .   Specialties: Nephrology, Internal Medicine Contact information: [place]  [address] [phone]                 Time spent: Review of old chart, current chart, lab, x-ray, cardiac tests and discussion with patient over 60 minutes.  Signed: Birdie Riddle 06/07/2022, 1:41 PM

## 2022-06-07 NOTE — Progress Notes (Deleted)
06/07/2022  Colleen Curtis DOB: 1966-02-15 MRN: [id number]   [name]  For the purposes of helping with transportation needs, [facility] partners with outside transportation providers (taxi companies, [place], Social research officer, government.) to give [employer] patients or other approved people the choice of on-demand rides [name]") to our buildings for non-emergency visits.  By using [employer], I, the person signing this document, on behalf of myself and/or any legal minors (in my care using the [employer]), agree:  Government social research officer given to me are supplied by independent, outside transportation providers who do not work for, or have any affiliation with, [employer]. [place] is not a transportation company. [facility] has no control over the quality or safety of the rides I get using [employer]. [place] has no control over whether any outside ride will happen on time or not. [facility] gives no guarantee on the reliability, quality, safety, or availability on any rides, or that no mistakes will happen. I know and accept that traveling by vehicle (car, truck, SVU, Lucianne Lei, bus, taxi, etc.) has risks of serious injuries such as disability, being paralyzed, and death. I know and agree the risk of using [employer] is mine alone, and not [employer]. Transport Services are provided "as is" and as are available. The transportation providers are in charge for all inspections and care of the vehicles used to provide these rides. I agree not to take legal action against [place], its agents, employees, officers, directors, representatives, insurers, attorneys, assigns, successors, subsidiaries, and affiliates at any time for any reasons related directly or indirectly to using [employer]. I also agree not to take legal action against [facility] or its affiliates for any injury, death, or damage to property caused by or related to  using [employer]. I have read this Waiver and Release of Liability, and I understand the terms used in it and their legal meaning. This Waiver is freely and voluntarily given with the understanding that my right (or any legal minors) to legal action against [place] relating to [employer] is knowingly given up to use these services.   I attest that I read the Ride Waiver and Release of Liability to Colleen Curtis, gave Ms. Aul the opportunity to ask questions and answered the questions asked (if any). I affirm that Colleen Curtis then provided consent for assistance with transportation.

## 2022-06-07 NOTE — Progress Notes (Signed)
[facility] NEPHROLOGY PROGRESS NOTE  Assessment/ Plan: Pt is a 57 y.o. yo female past medical history significant for bipolar disorder, hypertension poorly controlled, tobacco use, obesity, CAD, cardiomyopathy, stroke, seizure disorder, CKD presented with chest pain and cough seen as a consultation for evaluation of CKD.  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  UA has not been sent yet.  Pending SPEP, PTH.  She has chronic longstanding CKD and I think this is just progression of her underlying kidney disease.  Noted some fluctuation in creatinine level today.  I will start oral sodium bicarbonate for metabolic acidosis.  She is euvolemic and has no signs or symptoms of uremia.  No need for dialysis.  She will need to follow with nephrologist for CKD management.  # Metabolic acidosis due to renal failure: Start sodium bicarbonate.  # Anemia of CKD: Hemoglobin at goal.  # HTN/volume: Blood pressure is well-controlled after resuming her home medication.  Subjective: Seen and examined at bedside.  Urine output is recorded only 400 cc.  Denies nausea, vomiting, chest pain, shortness of breath.  No dysgeusia.  No major health event overnight. Objective Vital signs in last 24 hours: Vitals:   06/07/22 0021 06/07/22 0354 06/07/22 0400 06/07/22 0805  BP: 121/84 108/62 (!) 134/96 128/79  Pulse: 68 66  66  Resp:    17  Temp: 98.7 F (37.1 C) 98.5 F (36.9 C)  98.7 F (37.1 C)  TempSrc: Oral Oral  Oral  SpO2: 99% 100%  100%  Weight: 106.4 kg     Height:       Weight change: -0.399 kg  Intake/Output Summary (Last 24 hours) at 06/07/2022 0912 Last data filed at 06/07/2022 0806 Gross per 24 hour  Intake 1043.39 ml  Output 400 ml  Net 643.39 ml       Labs: RENAL PANEL Recent Labs  Lab 06/05/22 2344 06/06/22 0505 06/07/22 0114  NA 138 137 138  K 4.2 3.9 3.9  CL  112* 111 110  CO2 13* 15* 14*  GLUCOSE 106* 93 87  BUN 23* 22* 27*  CREATININE 3.25* 3.05* 3.31*  CALCIUM 8.5* 8.3* 8.1*  PHOS  --   --  4.3  ALBUMIN 3.2*  --  2.8*    Liver Function Tests: Recent Labs  Lab 06/05/22 2344 06/07/22 0114  AST 15  --   ALT 9  --   ALKPHOS 70  --   BILITOT 0.6  --   PROT 6.4*  --   ALBUMIN 3.2* 2.8*   Recent Labs  Lab 06/05/22 2344  LIPASE 47   No results for input(s): "AMMONIA" in the last 168 hours. CBC: Recent Labs    10/31/21 1358 06/05/22 2344 06/06/22 0505 06/07/22 0114  HGB 14.7 13.4 13.0 11.7*  MCV 82.3 82.9 82.5 83.3    Cardiac Enzymes: No results for input(s): "CKTOTAL", "CKMB", "CKMBINDEX", "TROPONINI" in the last 168 hours. CBG: No results for input(s): "GLUCAP" in the last 168 hours.  Iron Studies: No results for input(s): "IRON", "TIBC", "TRANSFERRIN", "FERRITIN" in the last 72 hours. Studies/Results: US RENAL  Result Date: 06/06/2022 CLINICAL DATA:  Chronic kidney disease. EXAM: RENAL / URINARY TRACT ULTRASOUND COMPLETE COMPARISON:  None Available. FINDINGS: Right Kidney: Renal measurements: 9.4 cm x 5.6 cm x 3.9 cm = volume: 108.39 mL. Diffusely increased echogenicity of the renal parenchyma is noted. No mass or hydronephrosis visualized. Left Kidney: Renal measurements: 9.0  cm x 3.5 cm x 3.8 cm = volume: 63.39 mL. Diffusely increased echogenicity of the renal parenchyma is noted. No mass or hydronephrosis visualized. Bladder: Poorly distended and subsequently limited in evaluation. Other: None. IMPRESSION: 1. Echogenic renal parenchyma which may represent sequelae associated with medical renal disease. Electronically Signed   By: Virgina Norfolk M.D.   On: 06/06/2022 19:49   DG Chest 2 View  Result Date: 06/06/2022 CLINICAL DATA:  Chest pain. EXAM: CHEST - 2 VIEW COMPARISON:  10/31/2021 FINDINGS: Low lung volumes. Mild cardiomegaly is stable. Coronary artery stent. Trace fluid in the fissures without large subpulmonic  effusion. No focal airspace disease. No pleural fluid or pneumothorax. No acute osseous abnormalities. Lower cervical spine hardware. IMPRESSION: Low lung volumes with mild cardiomegaly. Trace fluid in the fissures. Electronically Signed   By: Keith Rake M.D.   On: 06/06/2022 00:00   DG Foot Complete Left  Result Date: 06/05/2022 CLINICAL DATA:  Foot swelling. EXAM: LEFT FOOT - COMPLETE 3+ VIEW COMPARISON:  None Available. FINDINGS: Hammertoe deformity of the digits. Mild dorsal spurring of the talonavicular joint. 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 No erosion, periostitis or bony destruction. No fracture. Plantar calcaneal spur and Achilles tendon enthesophyte. Dorsal soft tissue edema. IMPRESSION: 1. Dorsal soft tissue edema. No acute osseous abnormality. 2. Hammertoe deformity of the digits. Mild scattered osteoarthritis. Electronically Signed   By: Keith Rake M.D.   On: 06/05/2022 23:59    Medications: Infusions:  sodium chloride     heparin 1,400 Units/hr (06/07/22 0359)    Scheduled Medications:  allopurinol  100 mg Oral Daily   amLODipine  5 mg Oral Daily   aspirin EC  81 mg Oral Daily   atorvastatin  40 mg Oral Daily   carvedilol  3.125 mg Oral BID WC   clopidogrel  75 mg Oral Q breakfast   colchicine  0.6 mg Oral Daily   hydrALAZINE  25 mg Oral Q8H   influenza vac split quadrivalent PF  0.5 mL Intramuscular Tomorrow-1000   isosorbide mononitrate  60 mg Oral Daily   sodium chloride flush  3 mL Intravenous Q12H   traMADol  50 mg Oral Q12H    have reviewed scheduled and prn medications.  Physical Exam: General:NAD, comfortable Heart:RRR, s1s2 nl Lungs:clear b/l, no crackle Abdomen:soft, Non-tender, non-distended Extremities:No edema Neurology: Alert, awake and following commands.  [facility] 06/07/2022,9:12 AM  LOS: 1 day

## 2022-06-07 NOTE — Plan of Care (Signed)
Discharge instructions discussed with patient.  Patient instructed on home medications, restrictions, and follow up appointments. Belongings gathered and sent with patient.  Patients medications discussed, pt will need a cab voucher will follow up with Case Manager.

## 2022-06-07 NOTE — TOC Transition Note (Addendum)
Transition of Care [facility]) - CM/SW Discharge Note   Patient Details  Name: Elice Matsuno MRN: [id number] Date of Birth: January 10, 1966  [facility] of Care [facility]) CM/SW Contact:  Zenon Mayo, RN Phone Number: 06/07/2022, 1:51 PM   Clinical Narrative:    Patient is for dc today, has no needs.  Patient states she was not able to get in touch with someone to pick her up, will need cab voucher. Waiver is on shadow chart.         Patient Goals and CMS Choice      Discharge Placement                         Discharge Plan and Services Additional resources added to the After Visit Summary for                                       Social Determinants of Health (SDOH) Interventions SDOH Screenings   Food Insecurity: No Food Insecurity (06/06/2022)  Housing: Low Risk  (06/06/2022)  Transportation Needs: No Transportation Needs (06/06/2022)  Utilities: Not At Risk (06/06/2022)  Tobacco Use: High Risk (06/06/2022)     Readmission Risk Interventions     No data to display

## 2022-06-08 LAB — PTH, INTACT AND CALCIUM
Calcium, Total (PTH): 7.8 mg/dL — ABNORMAL LOW (ref 8.7–10.2)
PTH: 149 pg/mL — ABNORMAL HIGH (ref 15–65)

## 2022-06-08 LAB — LIPOPROTEIN A (LPA): Lipoprotein (a): 207.7 nmol/L — ABNORMAL HIGH (ref <75.0)

## 2022-06-13 LAB — PROTEIN ELECTROPHORESIS, SERUM
A/G Ratio: 1.2 (ref 0.7–1.7)
Albumin ELP: 3 g/dL (ref 2.9–4.4)
Alpha-1-Globulin: 0.2 g/dL (ref 0.0–0.4)
Alpha-2-Globulin: 0.9 g/dL (ref 0.4–1.0)
Beta Globulin: 0.7 g/dL (ref 0.7–1.3)
Gamma Globulin: 0.7 g/dL (ref 0.4–1.8)
Globulin, Total: 2.5 g/dL (ref 2.2–3.9)
Total Protein ELP: 5.5 g/dL — ABNORMAL LOW (ref 6.0–8.5)

## 2022-07-22 ENCOUNTER — Encounter (HOSPITAL_COMMUNITY): Payer: Self-pay

## 2022-07-22 ENCOUNTER — Other Ambulatory Visit: Payer: Self-pay

## 2022-07-22 ENCOUNTER — Emergency Department (HOSPITAL_COMMUNITY): Payer: 59

## 2022-07-22 ENCOUNTER — Inpatient Hospital Stay (HOSPITAL_COMMUNITY)
Admission: EM | Admit: 2022-07-22 | Discharge: 2022-07-24 | DRG: 065 | Disposition: A | Discharge status: Home Health | Payer: 59 | Attending: Family Medicine | Admitting: Family Medicine

## 2022-07-22 DIAGNOSIS — I6329 Cerebral infarction due to unspecified occlusion or stenosis of other precerebral arteries: Secondary | ICD-10-CM | POA: Diagnosis present

## 2022-07-22 DIAGNOSIS — I130 Hypertensive heart and chronic kidney disease with heart failure and stage 1 through stage 4 chronic kidney disease, or unspecified chronic kidney disease: Secondary | ICD-10-CM | POA: Diagnosis present

## 2022-07-22 DIAGNOSIS — H5120 Internuclear ophthalmoplegia, unspecified eye: Secondary | ICD-10-CM | POA: Diagnosis present

## 2022-07-22 DIAGNOSIS — I2510 Atherosclerotic heart disease of native coronary artery without angina pectoris: Secondary | ICD-10-CM | POA: Diagnosis present

## 2022-07-22 DIAGNOSIS — I7389 Other specified peripheral vascular diseases: Secondary | ICD-10-CM | POA: Diagnosis present

## 2022-07-22 DIAGNOSIS — Z955 Presence of coronary angioplasty implant and graft: Secondary | ICD-10-CM

## 2022-07-22 DIAGNOSIS — G40909 Epilepsy, unspecified, not intractable, without status epilepticus: Secondary | ICD-10-CM | POA: Diagnosis present

## 2022-07-22 DIAGNOSIS — Z79899 Other long term (current) drug therapy: Secondary | ICD-10-CM | POA: Diagnosis not present

## 2022-07-22 DIAGNOSIS — I639 Cerebral infarction, unspecified: Secondary | ICD-10-CM | POA: Diagnosis present

## 2022-07-22 DIAGNOSIS — I161 Hypertensive emergency: Secondary | ICD-10-CM | POA: Diagnosis present

## 2022-07-22 DIAGNOSIS — E872 Acidosis, unspecified: Secondary | ICD-10-CM | POA: Diagnosis present

## 2022-07-22 DIAGNOSIS — I422 Other hypertrophic cardiomyopathy: Secondary | ICD-10-CM | POA: Diagnosis present

## 2022-07-22 DIAGNOSIS — F319 Bipolar disorder, unspecified: Secondary | ICD-10-CM | POA: Diagnosis present

## 2022-07-22 DIAGNOSIS — I252 Old myocardial infarction: Secondary | ICD-10-CM | POA: Diagnosis not present

## 2022-07-22 DIAGNOSIS — Z6841 Body Mass Index (BMI) 40.0 and over, adult: Secondary | ICD-10-CM | POA: Diagnosis not present

## 2022-07-22 DIAGNOSIS — E785 Hyperlipidemia, unspecified: Secondary | ICD-10-CM | POA: Diagnosis present

## 2022-07-22 DIAGNOSIS — J449 Chronic obstructive pulmonary disease, unspecified: Secondary | ICD-10-CM | POA: Diagnosis present

## 2022-07-22 DIAGNOSIS — Z8673 Personal history of transient ischemic attack (TIA), and cerebral infarction without residual deficits: Secondary | ICD-10-CM | POA: Diagnosis not present

## 2022-07-22 DIAGNOSIS — I6389 Other cerebral infarction: Secondary | ICD-10-CM | POA: Diagnosis not present

## 2022-07-22 DIAGNOSIS — M109 Gout, unspecified: Secondary | ICD-10-CM | POA: Diagnosis present

## 2022-07-22 DIAGNOSIS — Z9049 Acquired absence of other specified parts of digestive tract: Secondary | ICD-10-CM | POA: Diagnosis not present

## 2022-07-22 DIAGNOSIS — I6622 Occlusion and stenosis of left posterior cerebral artery: Secondary | ICD-10-CM | POA: Diagnosis present

## 2022-07-22 DIAGNOSIS — Z833 Family history of diabetes mellitus: Secondary | ICD-10-CM

## 2022-07-22 DIAGNOSIS — N184 Chronic kidney disease, stage 4 (severe): Secondary | ICD-10-CM | POA: Diagnosis present

## 2022-07-22 DIAGNOSIS — Z8249 Family history of ischemic heart disease and other diseases of the circulatory system: Secondary | ICD-10-CM | POA: Diagnosis not present

## 2022-07-22 DIAGNOSIS — R29700 NIHSS score 0: Secondary | ICD-10-CM | POA: Diagnosis present

## 2022-07-22 DIAGNOSIS — F17290 Nicotine dependence, other tobacco product, uncomplicated: Secondary | ICD-10-CM | POA: Diagnosis present

## 2022-07-22 DIAGNOSIS — F17200 Nicotine dependence, unspecified, uncomplicated: Secondary | ICD-10-CM | POA: Diagnosis not present

## 2022-07-22 DIAGNOSIS — I509 Heart failure, unspecified: Secondary | ICD-10-CM | POA: Diagnosis present

## 2022-07-22 DIAGNOSIS — Z91148 Patient's other noncompliance with medication regimen for other reason: Secondary | ICD-10-CM

## 2022-07-22 DIAGNOSIS — H532 Diplopia: Secondary | ICD-10-CM | POA: Diagnosis present

## 2022-07-22 LAB — URINALYSIS, ROUTINE W REFLEX MICROSCOPIC
Bacteria, UA: NONE SEEN
Bilirubin Urine: NEGATIVE
Glucose, UA: NEGATIVE mg/dL
Ketones, ur: NEGATIVE mg/dL
Leukocytes,Ua: NEGATIVE
Nitrite: NEGATIVE
Protein, ur: 100 mg/dL — AB
Specific Gravity, Urine: 1.01 (ref 1.005–1.030)
pH: 5 (ref 5.0–8.0)

## 2022-07-22 LAB — CBC
HCT: 41 % (ref 36.0–46.0)
Hemoglobin: 13.5 g/dL (ref 12.0–15.0)
MCH: 27.2 pg (ref 26.0–34.0)
MCHC: 32.9 g/dL (ref 30.0–36.0)
MCV: 82.7 fL (ref 80.0–100.0)
Platelets: 261 10*3/uL (ref 150–400)
RBC: 4.96 MIL/uL (ref 3.87–5.11)
RDW: 13.9 % (ref 11.5–15.5)
WBC: 10.3 10*3/uL (ref 4.0–10.5)
nRBC: 0 % (ref 0.0–0.2)

## 2022-07-22 LAB — BASIC METABOLIC PANEL
Anion gap: 11 (ref 5–15)
BUN: 34 mg/dL — ABNORMAL HIGH (ref 6–20)
CO2: 15 mmol/L — ABNORMAL LOW (ref 22–32)
Calcium: 8.6 mg/dL — ABNORMAL LOW (ref 8.9–10.3)
Chloride: 114 mmol/L — ABNORMAL HIGH (ref 98–111)
Creatinine, Ser: 3.49 mg/dL — ABNORMAL HIGH (ref 0.44–1.00)
GFR, Estimated: 14 mL/min — ABNORMAL LOW (ref >60)
Glucose, Bld: 93 mg/dL (ref 70–99)
Potassium: 4 mmol/L (ref 3.5–5.1)
Sodium: 140 mmol/L (ref 135–145)

## 2022-07-22 MED ORDER — HYDRALAZINE HCL 20 MG/ML IJ SOLN
5.0000 mg | Freq: Once | INTRAMUSCULAR | Status: AC
  Administered 2022-07-22: 5 mg via INTRAVENOUS
  Filled 2022-07-22: qty 1

## 2022-07-22 MED ORDER — LABETALOL HCL 5 MG/ML IV SOLN
20.0000 mg | Freq: Once | INTRAVENOUS | Status: AC
  Administered 2022-07-22: 20 mg via INTRAVENOUS
  Filled 2022-07-22: qty 4

## 2022-07-22 NOTE — ED Provider Notes (Signed)
[place] EMERGENCY DEPARTMENT AT [facility] Provider Note   CSN: [id number] Arrival date & time: 07/22/22  2007     History {Add pertinent medical, surgical, social history, OB history to HPI:1} Chief Complaint  Patient presents with   Blurred Vision    [facility] is a 57 y.o. female.  Patient has a history of hypertension and kidney disease.  She stated Saturday she started seeing double.  It seems to be worse when she looks to the left.   Eye Problem      Home Medications Prior to Admission medications   Medication Sig Start Date End Date Taking? Authorizing Provider  acetaminophen (TYLENOL) 325 MG tablet Take 2 tablets (650 mg total) by mouth every 4 (four) hours as needed for headache or mild pain. Patient taking differently: Take 3 tablets by mouth 2 (two) times daily as needed for headache or mild pain. 11/29/20   Orpah Cobb, MD  albuterol (VENTOLIN HFA) 108 (90 Base) MCG/ACT inhaler Inhale 1-2 puffs into the lungs daily as needed for wheezing or shortness of breath.    [provider]  allopurinol (ZYLOPRIM) 100 MG tablet Take 1 tablet (100 mg total) by mouth daily. Patient not taking: Reported on 10/31/2021 11/29/20   Orpah Cobb, MD  amLODipine (NORVASC) 5 MG tablet Take 1 tablet (5 mg total) by mouth daily. Patient not taking: Reported on 10/31/2021 11/29/20   Orpah Cobb, MD  aspirin 81 MG EC tablet Take 1 tablet (81 mg total) by mouth daily. Swallow whole. Patient not taking: Reported on 10/31/2021 11/29/20   Orpah Cobb, MD  atorvastatin (LIPITOR) 80 MG tablet Take 1 tablet (80 mg total) by mouth at bedtime. Patient not taking: Reported on 10/31/2021 11/29/20   Orpah Cobb, MD  carvedilol (COREG) 3.125 MG tablet Take 1 tablet (3.125 mg total) by mouth 2 (two) times daily with a meal. 06/07/22   Orpah Cobb, MD  clopidogrel (PLAVIX) 75 MG tablet Take 1 tablet (75 mg total) by mouth daily. Patient not taking: Reported on  10/31/2021 11/29/20   Orpah Cobb, MD  colchicine 0.6 MG tablet Take 1 tablet (0.6 mg total) by mouth daily. 06/08/22   Orpah Cobb, MD  hydrALAZINE (APRESOLINE) 25 MG tablet Take 1 tablet (25 mg total) by mouth 2 (two) times daily. 06/07/22   Orpah Cobb, MD  isosorbide mononitrate (IMDUR) 60 MG 24 hr tablet Take 1 tablet (60 mg total) by mouth daily. Patient not taking: Reported on 10/31/2021 11/29/20   Orpah Cobb, MD  nitroGLYCERIN (NITROSTAT) 0.4 MG SL tablet Place 0.4 mg under the tongue every 5 (five) minutes as needed for chest pain. Patient not taking: Reported on 10/31/2021    [provider]  sodium bicarbonate 650 MG tablet Take 1 tablet (650 mg total) by mouth 2 (two) times daily. 06/07/22   Orpah Cobb, MD      Allergies    Patient has no known allergies.    Review of Systems   Review of Systems  Physical Exam Updated Vital Signs BP (!) 183/115   Pulse 69   Temp 97.8 F (36.6 C) (Oral)   Resp 18   Ht  (1.6 m)   Wt 104.3 kg   SpO2 96%   BMI 40.74 kg/m  Physical Exam  ED Results / Procedures / Treatments   Labs (all labs ordered are listed, but only abnormal results are displayed) Labs Reviewed  BASIC METABOLIC PANEL - Abnormal; Notable for the following components:  Result Value   Chloride 114 (*)    CO2 15 (*)    BUN 34 (*)    Creatinine, Ser 3.49 (*)    Calcium 8.6 (*)    GFR, Estimated 14 (*)    All other components within normal limits  URINALYSIS, ROUTINE W REFLEX MICROSCOPIC - Abnormal; Notable for the following components:   Color, Urine STRAW (*)    Hgb urine dipstick SMALL (*)    Protein, ur 100 (*)    All other components within normal limits  CBC  CBG MONITORING, ED    EKG None  Radiology MR BRAIN WO CONTRAST  Result Date: 07/22/2022 CLINICAL DATA:  [place] EXAM: MRI HEAD AND ORBITS WITHOUT CONTRAST TECHNIQUE: Multiplanar, multi-echo pulse sequences of the brain and surrounding structures were acquired without  intravenous contrast. Multiplanar, multi-echo pulse sequences of the orbits and surrounding structures were acquired including fat saturation techniques, without intravenous contrast administration. COMPARISON:  None Available. FINDINGS: MRI HEAD FINDINGS Brain: Small focus of acute ischemia at the dorsal left pons. No other acute lesion. There is confluent hyperintense T2-weighted signal within the periventricular and deep white matter. Mild generalized volume loss. No acute hemorrhage. Vascular: Normal flow voids. Skull and upper cervical spine: Normal marrow signal. Other: None. MRI ORBITS FINDINGS Orbits: No orbital mass or evidence of inflammation. Normal appearance of the globes, optic nerve-sheath complexes, extraocular muscles, orbital fat and lacrimal glands. Visualized sinuses: Clear. Soft tissues: Normal. IMPRESSION: 1. Small focus of acute ischemia at the dorsal left pons, at the paramedian pontine reticular formation, which may cause internuclear ophthalmoplegia. 2. Normal MRI of the orbits. 3. Findings of chronic ischemic microangiopathy. Electronically Signed   By: Deatra Robinson M.D.   On: 07/22/2022 22:35   MR ORBITS WO CONTRAST  Result Date: 07/22/2022 CLINICAL DATA:  [place] EXAM: MRI HEAD AND ORBITS WITHOUT CONTRAST TECHNIQUE: Multiplanar, multi-echo pulse sequences of the brain and surrounding structures were acquired without intravenous contrast. Multiplanar, multi-echo pulse sequences of the orbits and surrounding structures were acquired including fat saturation techniques, without intravenous contrast administration. COMPARISON:  None Available. FINDINGS: MRI HEAD FINDINGS Brain: Small focus of acute ischemia at the dorsal left pons. No other acute lesion. There is confluent hyperintense T2-weighted signal within the periventricular and deep white matter. Mild generalized volume loss. No acute hemorrhage. Vascular: Normal flow voids. Skull and upper cervical spine: Normal marrow signal.  Other: None. MRI ORBITS FINDINGS Orbits: No orbital mass or evidence of inflammation. Normal appearance of the globes, optic nerve-sheath complexes, extraocular muscles, orbital fat and lacrimal glands. Visualized sinuses: Clear. Soft tissues: Normal. IMPRESSION: 1. Small focus of acute ischemia at the dorsal left pons, at the paramedian pontine reticular formation, which may cause internuclear ophthalmoplegia. 2. Normal MRI of the orbits. 3. Findings of chronic ischemic microangiopathy. Electronically Signed   By: Deatra Robinson M.D.   On: 07/22/2022 22:35    Procedures Procedures  {Document cardiac monitor, telemetry assessment procedure when appropriate:1}  Medications Ordered in ED Medications  labetalol (NORMODYNE) injection 20 mg (has no administration in time range)  hydrALAZINE (APRESOLINE) injection 5 mg (5 mg Intravenous Given 07/22/22 2226)    ED Course/ Medical Decision Making/ A&P  CRITICAL CARE Performed by: Bethann Berkshire Total critical care time: 45 minutes Critical care time was exclusive of separately billable procedures and treating other patients. Critical care was necessary to treat or prevent imminent or life-threatening deterioration. Critical care was time spent personally by me on the following activities: development of treatment  plan with patient and/or surrogate as well as nursing, discussions with consultants, evaluation of patient's response to treatment, examination of patient, obtaining history from patient or surrogate, ordering and performing treatments and interventions, ordering and review of laboratory studies, ordering and review of radiographic studies, pulse oximetry and re-evaluation of patient's condition.   Patient with an acute stroke.  I spoke with Dr. Wilford Corner  {Patient also has poorly controlled blood pressure and she has been given some hydralazine and then labetalol Click here for ABCD2, HEART and other calculatorsREFRESH Note before signing :1}                           Medical Decision Making Amount and/or Complexity of Data Reviewed Labs: ordered. Radiology: ordered.  Risk Prescription drug management. Decision regarding hospitalization.   Patient with an acute stroke that is 11 to 48 hours old.  She will be admitted to medicine over [facility] with neurology consult  {Document critical care time when appropriate:1} {Document review of labs and clinical decision tools ie heart score, Chads2Vasc2 etc:1}  {Document your independent review of radiology images, and any outside records:1} {Document your discussion with family members, caretakers, and with consultants:1} {Document social determinants of health affecting pt's care:1} {Document your decision making why or why not admission, treatments were needed:1} Final Clinical Impression(s) / ED Diagnoses Final diagnoses:  Cerebral infarction, unspecified mechanism    Rx / DC Orders ED Discharge Orders     None

## 2022-07-22 NOTE — ED Notes (Addendum)
Both pts eyes are 20/40 on visual acuity

## 2022-07-22 NOTE — H&P (Addendum)
History and Physical    Colleen Curtis WFU:[id number] DOB: 1966/02/17 DOA: 07/22/2022  PCP: Pcp, No  Patient coming from: home   I have personally briefly reviewed patient's old medical records in [facility]  Chief Complaint: blurry vision x 3 days  HPI: Colleen Curtis is a 57 y.o. 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  Patient was discharged  home to follow up with cardiology and nephrology.  Patient now presents to ED with blurry vision x 3 days  associated with dizziness BIB EMS. Per patient she also noted interim history of a viral gastroenteritis before onset of her symptoms.  Which of note she not states has completely resolved.  In reference to her blurry vision she states its is now improved. She notes no current Ha, chest pain , n/v/d/ or sob. She states she feels well other than mild blurry vision when she looks towards the left.    ED Course:  IN ED  Vitals: Afeb, bp 189/121, rr 17, HR 72  Ua neg  Wbc 10.3,  hgb 13.5, plt 261  N a 140, K 4, CL 114, bicarb 15 , cr 3.49 ,  GFR 14  CTH EKG: nsr , no hyperacute findings MRI brain: IMPRESSION: 1. Small focus of acute ischemia at the dorsal left pons, at the paramedian pontine reticular formation, which may cause internuclear ophthalmoplegia. 2. Normal MRI of the orbits. 3. Findings of chronic ischemic microangiopathy.   Tx  hydralazine 5mg  ,labetaolol 20 mg   Review of Systems: As per HPI otherwise 10 point review of systems negative.   Past Medical History:  Diagnosis Date   Bipolar disorder    Cancer    COPD (chronic obstructive pulmonary  disease)    Coronary artery disease    Diverticulitis    Hypertension    Myocardial infarction    Seizure    Stroke     Past Surgical History:  Procedure Laterality Date   ABDOMINAL SURGERY     BRAIN SURGERY     CHOLECYSTECTOMY     LEFT HEART CATH AND CORONARY ANGIOGRAPHY N/A 11/27/2020   Procedure: LEFT HEART CATH AND CORONARY ANGIOGRAPHY;  Surgeon: Orpah Cobb, MD;  Location: [facility];  Service: Cardiovascular;  Laterality: N/A;     reports that she has been smoking cigars. She has never used smokeless tobacco. She reports current drug use. Drug: Marijuana. She reports that she does not drink alcohol.  No Known Allergies  Family History  Problem Relation Age of Onset   Heart disease Mother    Hypertension Mother    Heart disease Father    Diabetes Mellitus II Father    Arrhythmia Brother     Prior to Admission medications   Medication Sig Start Date End Date Taking? Authorizing Provider  acetaminophen (TYLENOL) 325 MG tablet Take 2 tablets (650 mg total) by mouth every 4 (four) hours as needed for headache or mild pain. Patient taking differently: Take 3 tablets by mouth 2 (two) times daily as needed for headache or mild pain. 11/29/20  Yes Orpah Cobb, MD  albuterol (VENTOLIN HFA)  108 (90 Base) MCG/ACT inhaler Inhale 1-2 puffs into the lungs daily as needed for wheezing or shortness of breath.   Yes [provider]  carvedilol (COREG) 3.125 MG tablet Take 1 tablet (3.125 mg total) by mouth 2 (two) times daily with a meal. 06/07/22  Yes Orpah Cobb, MD  allopurinol (ZYLOPRIM) 100 MG tablet Take 1 tablet (100 mg total) by mouth daily. Patient not taking: Reported on 10/31/2021 11/29/20   Orpah Cobb, MD  amLODipine (NORVASC) 5 MG tablet Take 1 tablet (5 mg total) by mouth daily. Patient not taking: Reported on 10/31/2021 11/29/20   Orpah Cobb, MD  aspirin 81 MG EC tablet Take 1 tablet (81 mg total) by mouth daily. Swallow whole. Patient not taking:  Reported on 10/31/2021 11/29/20   Orpah Cobb, MD  atorvastatin (LIPITOR) 80 MG tablet Take 1 tablet (80 mg total) by mouth at bedtime. Patient not taking: Reported on 10/31/2021 11/29/20   Orpah Cobb, MD  clopidogrel (PLAVIX) 75 MG tablet Take 1 tablet (75 mg total) by mouth daily. Patient not taking: Reported on 10/31/2021 11/29/20   Orpah Cobb, MD  colchicine 0.6 MG tablet Take 1 tablet (0.6 mg total) by mouth daily. Patient not taking: Reported on 07/22/2022 06/08/22   Orpah Cobb, MD  hydrALAZINE (APRESOLINE) 25 MG tablet Take 1 tablet (25 mg total) by mouth 2 (two) times daily. Patient not taking: Reported on 07/22/2022 06/07/22   Orpah Cobb, MD  isosorbide mononitrate (IMDUR) 60 MG 24 hr tablet Take 1 tablet (60 mg total) by mouth daily. Patient not taking: Reported on 10/31/2021 11/29/20   Orpah Cobb, MD  nitroGLYCERIN (NITROSTAT) 0.4 MG SL tablet Place 0.4 mg under the tongue every 5 (five) minutes as needed for chest pain. Patient not taking: Reported on 10/31/2021    [provider]  sodium bicarbonate 650 MG tablet Take 1 tablet (650 mg total) by mouth 2 (two) times daily. Patient not taking: Reported on 07/22/2022 06/07/22   Orpah Cobb, MD    Physical Exam: Vitals:   07/22/22 2223 07/22/22 2230 07/22/22 2245 07/22/22 2316  BP: (!) 193/111 (!) 190/113 (!) 183/115 (!) 168/92  Pulse: 68 68 69 68  Resp: 18   18  Temp:    97.9 F (36.6 C)  TempSrc:    Oral  SpO2: 99% 99% 96% 100%  Weight:      Height:        Constitutional: NAD, calm, comfortable Vitals:   07/22/22 2223 07/22/22 2230 07/22/22 2245 07/22/22 2316  BP: (!) 193/111 (!) 190/113 (!) 183/115 (!) 168/92  Pulse: 68 68 69 68  Resp: 18   18  Temp:    97.9 F (36.6 C)  TempSrc:    Oral  SpO2: 99% 99% 96% 100%  Weight:      Height:       Eyes: PERRL, lids and conjunctivae normal ENMT: Mucous membranes are moist. Posterior pharynx clear of any exudate or lesions.Normal dentition.  Neck: normal,  supple, no masses, no thyromegaly Respiratory: clear to auscultation bilaterally, no wheezing, no crackles. Normal respiratory effort. No accessory muscle use.  Cardiovascular: Regular rate and rhythm, no murmurs / rubs / gallops. No extremity edema. 2+ pedal pulses.  Abdomen: obese, no tenderness, no masses palpated. No hepatosplenomegaly. Bowel sounds positive.  Musculoskeletal: no clubbing / cyanosis. No joint deformity upper and lower extremities. Good ROM, no contractures. Normal muscle tone.  Skin: no rashes, lesions, ulcers. No induration Neurologic: CN III-12 grossly intact. Sensation intact,Strength 5/5  in all 4.  Psychiatric: Normal judgment and insight. Alert and oriented x 3. Normal mood.    Labs on Admission: I have personally reviewed following labs and imaging studies  CBC: Recent Labs  Lab 07/22/22 2040  WBC 10.3  HGB 13.5  HCT 41.0  MCV 82.7  PLT 261   Basic Metabolic Panel: Recent Labs  Lab 07/22/22 2040  NA 140  K 4.0  CL 114*  CO2 15*  GLUCOSE 93  BUN 34*  CREATININE 3.49*  CALCIUM 8.6*   GFR: Estimated Creatinine Clearance: 20.1 mL/min (A) (by C-G formula based on SCr of 3.49 mg/dL (H)). Liver Function Tests: No results for input(s): "AST", "ALT", "ALKPHOS", "BILITOT", "PROT", "ALBUMIN" in the last 168 hours. No results for input(s): "LIPASE", "AMYLASE" in the last 168 hours. No results for input(s): "AMMONIA" in the last 168 hours. Coagulation Profile: No results for input(s): "INR", "PROTIME" in the last 168 hours. Cardiac Enzymes: No results for input(s): "CKTOTAL", "CKMB", "CKMBINDEX", "TROPONINI" in the last 168 hours. BNP (last 3 results) No results for input(s): "PROBNP" in the last 8760 hours. HbA1C: No results for input(s): "HGBA1C" in the last 72 hours. CBG: No results for input(s): "GLUCAP" in the last 168 hours. Lipid Profile: No results for input(s): "CHOL", "HDL", "LDLCALC", "TRIG", "CHOLHDL", "LDLDIRECT" in the last 72  hours. Thyroid Function Tests: No results for input(s): "TSH", "T4TOTAL", "FREET4", "T3FREE", "THYROIDAB" in the last 72 hours. Anemia Panel: No results for input(s): "VITAMINB12", "FOLATE", "FERRITIN", "TIBC", "IRON", "RETICCTPCT" in the last 72 hours. Urine analysis:    Component Value Date/Time   COLORURINE STRAW (A) 07/22/2022 2026   APPEARANCEUR CLEAR 07/22/2022 2026   LABSPEC 1.010 07/22/2022 2026   PHURINE 5.0 07/22/2022 2026   GLUCOSEU NEGATIVE 07/22/2022 2026   HGBUR SMALL (A) 07/22/2022 2026   BILIRUBINUR NEGATIVE 07/22/2022 2026   KETONESUR NEGATIVE 07/22/2022 2026   PROTEINUR 100 (A) 07/22/2022 2026   NITRITE NEGATIVE 07/22/2022 2026   LEUKOCYTESUR NEGATIVE 07/22/2022 2026    Radiological Exams on Admission: MR BRAIN WO CONTRAST  Result Date: 07/22/2022 CLINICAL DATA:  [place] EXAM: MRI HEAD AND ORBITS WITHOUT CONTRAST TECHNIQUE: Multiplanar, multi-echo pulse sequences of the brain and surrounding structures were acquired without intravenous contrast. Multiplanar, multi-echo pulse sequences of the orbits and surrounding structures were acquired including fat saturation techniques, without intravenous contrast administration. COMPARISON:  None Available. FINDINGS: MRI HEAD FINDINGS Brain: Small focus of acute ischemia at the dorsal left pons. No other acute lesion. There is confluent hyperintense T2-weighted signal within the periventricular and deep white matter. Mild generalized volume loss. No acute hemorrhage. Vascular: Normal flow voids. Skull and upper cervical spine: Normal marrow signal. Other: None. MRI ORBITS FINDINGS Orbits: No orbital mass or evidence of inflammation. Normal appearance of the globes, optic nerve-sheath complexes, extraocular muscles, orbital fat and lacrimal glands. Visualized sinuses: Clear. Soft tissues: Normal. IMPRESSION: 1. Small focus of acute ischemia at the dorsal left pons, at the paramedian pontine reticular formation, which may cause  internuclear ophthalmoplegia. 2. Normal MRI of the orbits. 3. Findings of chronic ischemic microangiopathy. Electronically Signed   By: Deatra Robinson M.D.   On: 07/22/2022 22:35   MR ORBITS WO CONTRAST  Result Date: 07/22/2022 CLINICAL DATA:  [place] EXAM: MRI HEAD AND ORBITS WITHOUT CONTRAST TECHNIQUE: Multiplanar, multi-echo pulse sequences of the brain and surrounding structures were acquired without intravenous contrast. Multiplanar, multi-echo pulse sequences of the orbits and surrounding structures were acquired including fat saturation techniques, without intravenous contrast administration. COMPARISON:  None Available. FINDINGS: MRI HEAD FINDINGS Brain: Small focus of acute ischemia at the dorsal left pons. No other acute lesion. There is confluent hyperintense T2-weighted signal within the periventricular and deep white matter. Mild generalized volume loss. No acute hemorrhage. Vascular: Normal flow voids. Skull and upper cervical spine: Normal marrow signal. Other: None. MRI ORBITS FINDINGS Orbits: No orbital mass or evidence of inflammation. Normal appearance of the globes, optic nerve-sheath complexes, extraocular muscles, orbital fat and lacrimal glands. Visualized sinuses: Clear. Soft tissues: Normal. IMPRESSION: 1. Small focus of acute ischemia at the dorsal left pons, at the paramedian pontine reticular formation, which may cause internuclear ophthalmoplegia. 2. Normal MRI of the orbits. 3. Findings of chronic ischemic microangiopathy. Electronically Signed   By: Deatra Robinson M.D.   On: 07/22/2022 22:35    EKG: Independently reviewed. See above  Assessment/Plan   Acute CVA -QMV:HQION focus of acute ischemia at the dorsal left pons, at the paramedian pontine reticular formation, which may cause internuclear ophthalmoplegia. -noted vision changes still persistent  ,on set 3 days ago  - prn iv bp , resume home bp medications as event was 72 hours prior  -CTA pending  - case discussed  with neurology DR Wilford Corner -admit tia/cva r/o protocol -A1c/HLD pending -no need for Permissive HTN as symptoms over 72 hours  - 20 % reduction from elevated presentation  - continue on asa/ plavix   ( note patient has been noncompliant with these medications) -neuro checks , SLP, PT/OT  -await final neuro recs      HTN Emergency  - CVA in setting of elevated bp  - agree with decreasing bp by 20%  -s/p labetolol and Hydralazine  - resume home regimen with carvedilol, amlodipine, hydralazine,imdur  - prn anti-htn medication  -of note patient has hx of noncompliance    Non-obstrutive  hypertrophic cardiomyopathy  -resume carvediolol, hydralazine/ imdur  Bipolar d/o  -resume regimen as able    COPD  -not O2 dependent -no acute exacerbation  -prn nebs   CAD s/p MI s/p stents -recent ACS  2 months ago -continue on asa and plavix    Hx of Seizure -note currently on AED -last sz   CKDIIIb Metabolic acidosis  -cr at baseline /bicarb 15 -continue sodium bicarb   Tobacco d/o -encourage cessation   Morbid obesity   Gout  -resume gout medicine    Continued hx of noncompliance  - patient will benefit from med education   DVT prophylaxis: heparin Code Status: full/ as discussed per patient wishes in event of cardiac arrest  Family Communication: none at bedside Disposition Plan: patient  expected to be admitted greater than 2 midnights  Consults called:  Neurology DR Wilford Corner Admission status:  admit to [facility]    Lurline Del MD [facility] If 7PM-7AM, please contact night-coverage [email or web address] Password [facility]  07/22/2022, 11:40 PM

## 2022-07-22 NOTE — ED Notes (Signed)
Pt at MRI

## 2022-07-22 NOTE — ED Triage Notes (Signed)
PER EMS: pt is from home with c/o blurry vision and dizziness x 3 days. She said it started when she had a stomach virus. She no longer has the stomach virus but still has blurry vision, worse on left, as well as dizziness. Denies headache/n/v. A&OX4, ambulatory with standby assist.  BP-196/110, HR-70, O2-99% CBG-91

## 2022-07-22 NOTE — ED Notes (Signed)
Pt has returned from MRI. 

## 2022-07-22 NOTE — H&P (Incomplete)
History and Physical    Colleen Curtis WOE:[id number] DOB: Dec 20, 1965 DOA: 07/22/2022  PCP: Pcp, No  Patient coming from: home   I have personally briefly reviewed patient's old medical records in [facility]  Chief Complaint: blurry vision x 3 days  HPI: Colleen Curtis is a 57 y.o. 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  Patient was discharged  home to follow up with cardiology and nephrology.  Patient now presents to ED with blurry vision x 3 days  associated with dizziness BIB EMS. Per patient she also noted interim history of a viral gastroenteritis before onset of her symptoms.  Which of note she not states has completely resolved.  In reference to her blurry vision she states    ED Course:  IN ED  Vitals: Afeb, bp 189/121, rr 17, HR 72  Ua neg  Wbc 10.3,  hgb 13.5, plt 261  N a 140, K 4, CL 114, bicarb 15 , cr 3.49 ,  GFR 14  CTH EKG: nsr , no hyperacute findings MRI brain: IMPRESSION: 1. Small focus of acute ischemia at the dorsal left pons, at the paramedian pontine reticular formation, which may cause internuclear ophthalmoplegia. 2. Normal MRI of the orbits. 3. Findings of chronic ischemic microangiopathy.   Tx  hydralazine 5mg  ,labetaolol 20 mg   Review of Systems: As per HPI otherwise 10 point review of systems negative.   Past Medical History:  Diagnosis Date  . Bipolar disorder   . Cancer   . COPD (chronic obstructive pulmonary disease)   . Coronary artery disease   . Diverticulitis   . Hypertension   . Myocardial infarction   . Seizure   . Stroke     Past Surgical History:   Procedure Laterality Date  . ABDOMINAL SURGERY    . BRAIN SURGERY    . CHOLECYSTECTOMY    . LEFT HEART CATH AND CORONARY ANGIOGRAPHY N/A 11/27/2020   Procedure: LEFT HEART CATH AND CORONARY ANGIOGRAPHY;  Surgeon: Orpah Cobb, MD;  Location: [facility] INVASIVE CV LAB;  Service: Cardiovascular;  Laterality: N/A;     reports that she has been smoking cigars. She has never used smokeless tobacco. She reports current drug use. Drug: Marijuana. She reports that she does not drink alcohol.  No Known Allergies  Family History  Problem Relation Age of Onset  . Heart disease Mother   . Hypertension Mother   . Heart disease Father   . Diabetes Mellitus II Father   . Arrhythmia Brother     Prior to Admission medications   Medication Sig Start Date End Date Taking? Authorizing Provider  acetaminophen (TYLENOL) 325 MG tablet Take 2 tablets (650 mg total) by mouth every 4 (four) hours as needed for headache or mild pain. Patient taking differently: Take 3 tablets by mouth 2 (two) times daily as needed for headache or mild pain. 11/29/20  Yes Orpah Cobb, MD  albuterol (VENTOLIN HFA) 108 (90 Base) MCG/ACT inhaler Inhale 1-2 puffs into the lungs daily as needed for wheezing or shortness of breath.   Yes [provider]  carvedilol (COREG) 3.125 MG  tablet Take 1 tablet (3.125 mg total) by mouth 2 (two) times daily with a meal. 06/07/22  Yes Orpah Cobb, MD  allopurinol (ZYLOPRIM) 100 MG tablet Take 1 tablet (100 mg total) by mouth daily. Patient not taking: Reported on 10/31/2021 11/29/20   Orpah Cobb, MD  amLODipine (NORVASC) 5 MG tablet Take 1 tablet (5 mg total) by mouth daily. Patient not taking: Reported on 10/31/2021 11/29/20   Orpah Cobb, MD  aspirin 81 MG EC tablet Take 1 tablet (81 mg total) by mouth daily. Swallow whole. Patient not taking: Reported on 10/31/2021 11/29/20   Orpah Cobb, MD  atorvastatin (LIPITOR) 80 MG tablet Take 1 tablet (80 mg total) by mouth at bedtime. Patient  not taking: Reported on 10/31/2021 11/29/20   Orpah Cobb, MD  clopidogrel (PLAVIX) 75 MG tablet Take 1 tablet (75 mg total) by mouth daily. Patient not taking: Reported on 10/31/2021 11/29/20   Orpah Cobb, MD  colchicine 0.6 MG tablet Take 1 tablet (0.6 mg total) by mouth daily. Patient not taking: Reported on 07/22/2022 06/08/22   Orpah Cobb, MD  hydrALAZINE (APRESOLINE) 25 MG tablet Take 1 tablet (25 mg total) by mouth 2 (two) times daily. Patient not taking: Reported on 07/22/2022 06/07/22   Orpah Cobb, MD  isosorbide mononitrate (IMDUR) 60 MG 24 hr tablet Take 1 tablet (60 mg total) by mouth daily. Patient not taking: Reported on 10/31/2021 11/29/20   Orpah Cobb, MD  nitroGLYCERIN (NITROSTAT) 0.4 MG SL tablet Place 0.4 mg under the tongue every 5 (five) minutes as needed for chest pain. Patient not taking: Reported on 10/31/2021    [provider]  sodium bicarbonate 650 MG tablet Take 1 tablet (650 mg total) by mouth 2 (two) times daily. Patient not taking: Reported on 07/22/2022 06/07/22   Orpah Cobb, MD    Physical Exam: Vitals:   07/22/22 2223 07/22/22 2230 07/22/22 2245 07/22/22 2316  BP: (!) 193/111 (!) 190/113 (!) 183/115 (!) 168/92  Pulse: 68 68 69 68  Resp: 18   18  Temp:    97.9 F (36.6 C)  TempSrc:    Oral  SpO2: 99% 99% 96% 100%  Weight:      Height:        Constitutional: NAD, calm, comfortable Vitals:   07/22/22 2223 07/22/22 2230 07/22/22 2245 07/22/22 2316  BP: (!) 193/111 (!) 190/113 (!) 183/115 (!) 168/92  Pulse: 68 68 69 68  Resp: 18   18  Temp:    97.9 F (36.6 C)  TempSrc:    Oral  SpO2: 99% 99% 96% 100%  Weight:      Height:       Eyes: PERRL, lids and conjunctivae normal ENMT: Mucous membranes are moist. Posterior pharynx clear of any exudate or lesions.Normal dentition.  Neck: normal, supple, no masses, no thyromegaly Respiratory: clear to auscultation bilaterally, no wheezing, no crackles. Normal respiratory effort. No accessory  muscle use.  Cardiovascular: Regular rate and rhythm, no murmurs / rubs / gallops. No extremity edema. 2+ pedal pulses. No carotid bruits.  Abdomen: no tenderness, no masses palpated. No hepatosplenomegaly. Bowel sounds positive.  Musculoskeletal: no clubbing / cyanosis. No joint deformity upper and lower extremities. Good ROM, no contractures. Normal muscle tone.  Skin: no rashes, lesions, ulcers. No induration Neurologic: CN 2-12 grossly intact. Sensation intact, DTR normal. Strength 5/5 in all 4.  Psychiatric: Normal judgment and insight. Alert and oriented x 3. Normal mood.    Labs on Admission: I have personally reviewed  following labs and imaging studies  CBC: Recent Labs  Lab 07/22/22 2040  WBC 10.3  HGB 13.5  HCT 41.0  MCV 82.7  PLT 261   Basic Metabolic Panel: Recent Labs  Lab 07/22/22 2040  NA 140  K 4.0  CL 114*  CO2 15*  GLUCOSE 93  BUN 34*  CREATININE 3.49*  CALCIUM 8.6*   GFR: Estimated Creatinine Clearance: 20.1 mL/min (A) (by C-G formula based on SCr of 3.49 mg/dL (H)). Liver Function Tests: No results for input(s): "AST", "ALT", "ALKPHOS", "BILITOT", "PROT", "ALBUMIN" in the last 168 hours. No results for input(s): "LIPASE", "AMYLASE" in the last 168 hours. No results for input(s): "AMMONIA" in the last 168 hours. Coagulation Profile: No results for input(s): "INR", "PROTIME" in the last 168 hours. Cardiac Enzymes: No results for input(s): "CKTOTAL", "CKMB", "CKMBINDEX", "TROPONINI" in the last 168 hours. BNP (last 3 results) No results for input(s): "PROBNP" in the last 8760 hours. HbA1C: No results for input(s): "HGBA1C" in the last 72 hours. CBG: No results for input(s): "GLUCAP" in the last 168 hours. Lipid Profile: No results for input(s): "CHOL", "HDL", "LDLCALC", "TRIG", "CHOLHDL", "LDLDIRECT" in the last 72 hours. Thyroid Function Tests: No results for input(s): "TSH", "T4TOTAL", "FREET4", "T3FREE", "THYROIDAB" in the last 72  hours. Anemia Panel: No results for input(s): "VITAMINB12", "FOLATE", "FERRITIN", "TIBC", "IRON", "RETICCTPCT" in the last 72 hours. Urine analysis:    Component Value Date/Time   COLORURINE STRAW (A) 07/22/2022 2026   APPEARANCEUR CLEAR 07/22/2022 2026   LABSPEC 1.010 07/22/2022 2026   PHURINE 5.0 07/22/2022 2026   GLUCOSEU NEGATIVE 07/22/2022 2026   HGBUR SMALL (A) 07/22/2022 2026   BILIRUBINUR NEGATIVE 07/22/2022 2026   KETONESUR NEGATIVE 07/22/2022 2026   PROTEINUR 100 (A) 07/22/2022 2026   NITRITE NEGATIVE 07/22/2022 2026   LEUKOCYTESUR NEGATIVE 07/22/2022 2026    Radiological Exams on Admission: MR BRAIN WO CONTRAST  Result Date: 07/22/2022 CLINICAL DATA:  [place] EXAM: MRI HEAD AND ORBITS WITHOUT CONTRAST TECHNIQUE: Multiplanar, multi-echo pulse sequences of the brain and surrounding structures were acquired without intravenous contrast. Multiplanar, multi-echo pulse sequences of the orbits and surrounding structures were acquired including fat saturation techniques, without intravenous contrast administration. COMPARISON:  None Available. FINDINGS: MRI HEAD FINDINGS Brain: Small focus of acute ischemia at the dorsal left pons. No other acute lesion. There is confluent hyperintense T2-weighted signal within the periventricular and deep white matter. Mild generalized volume loss. No acute hemorrhage. Vascular: Normal flow voids. Skull and upper cervical spine: Normal marrow signal. Other: None. MRI ORBITS FINDINGS Orbits: No orbital mass or evidence of inflammation. Normal appearance of the globes, optic nerve-sheath complexes, extraocular muscles, orbital fat and lacrimal glands. Visualized sinuses: Clear. Soft tissues: Normal. IMPRESSION: 1. Small focus of acute ischemia at the dorsal left pons, at the paramedian pontine reticular formation, which may cause internuclear ophthalmoplegia. 2. Normal MRI of the orbits. 3. Findings of chronic ischemic microangiopathy. Electronically Signed    By: Deatra Robinson M.D.   On: 07/22/2022 22:35   MR ORBITS WO CONTRAST  Result Date: 07/22/2022 CLINICAL DATA:  [place] EXAM: MRI HEAD AND ORBITS WITHOUT CONTRAST TECHNIQUE: Multiplanar, multi-echo pulse sequences of the brain and surrounding structures were acquired without intravenous contrast. Multiplanar, multi-echo pulse sequences of the orbits and surrounding structures were acquired including fat saturation techniques, without intravenous contrast administration. COMPARISON:  None Available. FINDINGS: MRI HEAD FINDINGS Brain: Small focus of acute ischemia at the dorsal left pons. No other acute lesion. There is confluent hyperintense T2-weighted  signal within the periventricular and deep white matter. Mild generalized volume loss. No acute hemorrhage. Vascular: Normal flow voids. Skull and upper cervical spine: Normal marrow signal. Other: None. MRI ORBITS FINDINGS Orbits: No orbital mass or evidence of inflammation. Normal appearance of the globes, optic nerve-sheath complexes, extraocular muscles, orbital fat and lacrimal glands. Visualized sinuses: Clear. Soft tissues: Normal. IMPRESSION: 1. Small focus of acute ischemia at the dorsal left pons, at the paramedian pontine reticular formation, which may cause internuclear ophthalmoplegia. 2. Normal MRI of the orbits. 3. Findings of chronic ischemic microangiopathy. Electronically Signed   By: Deatra Robinson M.D.   On: 07/22/2022 22:35    EKG: Independently reviewed. ***  Assessment/Plan   Acute CVA -UJW:[id number] focus of acute ischemia at the dorsal left pons, at the paramedian pontine reticular formation, which may cause internuclear ophthalmoplegia. -noted vision changes still persistent  ,on set 3 days ago  - Hx of CVA solely on asa 81 daily  - prn iv bp , resume home bp medications as event was 72 hours prior  -CTA pending  - case discussed with neurology DR Wilford Corner -admit tia/cva r/o protocol -A1c/HLD pending -no need for Permissive HTN  as symptoms over 72 hours  - 20 % reduction from elevated presentation  - per neuro ASA 81mg  daily + plavix 75mg  daily x21 days f/b ASA 81mg  daily monotherapy after that -neuro checks , SLP, PT/OT  -await final neuro recs     Uncontrolled HTN/  HTN Emergency  - as now noted organ damage with CVA in setting of elevated bp  - agree with decreasing bp by 20%  -s/p labetolol and Hydralazine  - resume home regimen , with prn anti-htn medication     Non-obstrutive  hypertrophic cardiomyopathy  -resume carvediolol, hydralazine/ imdur  Bipolar d/o  -resume regimen as able    COPD  -not O2 dependent -no acute exacerbation  -prn nebs   CAD s/p MI s/p stents -recent ACS  2 months ago -continue on asa and plavix    Hx of Seizure -note currently on AED -last sz   CKDIIIb -cr at baseline   Tobacco d/o -encourage cessation   Morbid obesity   Gout  -resume gout medicine     DVT prophylaxis: *** (Lovenox/Heparin/SCD's/anticoagulated/None (if comfort care) Code Status: *** (Full/Partial (specify details) Family Communication: *** (Specify name, relationship. Do not write "discussed with patient". Specify tel # if discussed over the phone) Disposition Plan: *** (specify when and where you expect patient to be discharged) Consults called: *** (with names) Admission status: *** (inpatient / obs / tele / medical floor / SDU)   Lurline Del MD [facility] Pager [phone]  If 7PM-7AM, please contact night-coverage [email or web address] Password [facility]  07/22/2022, 11:40 PM

## 2022-07-23 ENCOUNTER — Inpatient Hospital Stay (HOSPITAL_COMMUNITY): Payer: 59

## 2022-07-23 ENCOUNTER — Other Ambulatory Visit: Payer: Self-pay

## 2022-07-23 DIAGNOSIS — I639 Cerebral infarction, unspecified: Secondary | ICD-10-CM

## 2022-07-23 DIAGNOSIS — I6389 Other cerebral infarction: Secondary | ICD-10-CM

## 2022-07-23 LAB — LIPID PANEL
Cholesterol: 273 mg/dL — ABNORMAL HIGH (ref 0–200)
HDL: 41 mg/dL (ref >40)
LDL Cholesterol: 202 mg/dL — ABNORMAL HIGH (ref 0–99)
Total CHOL/HDL Ratio: 6.7 RATIO
Triglycerides: 149 mg/dL (ref <150)
VLDL: 30 mg/dL (ref 0–40)

## 2022-07-23 LAB — ECHOCARDIOGRAM LIMITED
Area-P 1/2: 2.37 cm2
Calc EF: 47.7 %
Est EF: 40
Height: 63 in
S' Lateral: 4.2 cm
Single Plane A2C EF: 44.5 %
Single Plane A4C EF: 60.2 %
Weight: 3680 oz

## 2022-07-23 LAB — RAPID URINE DRUG SCREEN, HOSP PERFORMED
Amphetamines: NOT DETECTED
Barbiturates: NOT DETECTED
Benzodiazepines: NOT DETECTED
Cocaine: NOT DETECTED
Opiates: NOT DETECTED
Tetrahydrocannabinol: POSITIVE — AB

## 2022-07-23 LAB — HEMOGLOBIN A1C
Hgb A1c MFr Bld: 5.4 % (ref 4.8–5.6)
Mean Plasma Glucose: 108.28 mg/dL

## 2022-07-23 LAB — CBC
HCT: 39.5 % (ref 36.0–46.0)
Hemoglobin: 12.8 g/dL (ref 12.0–15.0)
MCH: 27.1 pg (ref 26.0–34.0)
MCHC: 32.4 g/dL (ref 30.0–36.0)
MCV: 83.7 fL (ref 80.0–100.0)
Platelets: 245 10*3/uL (ref 150–400)
RBC: 4.72 MIL/uL (ref 3.87–5.11)
RDW: 14.1 % (ref 11.5–15.5)
WBC: 10.1 10*3/uL (ref 4.0–10.5)
nRBC: 0 % (ref 0.0–0.2)

## 2022-07-23 LAB — CREATININE, SERUM
Creatinine, Ser: 3.53 mg/dL — ABNORMAL HIGH (ref 0.44–1.00)
GFR, Estimated: 14 mL/min — ABNORMAL LOW (ref >60)

## 2022-07-23 MED ORDER — ATORVASTATIN CALCIUM 80 MG PO TABS
80.0000 mg | ORAL_TABLET | Freq: Every day | ORAL | Status: DC
  Administered 2022-07-23 (×2): 80 mg via ORAL
  Filled 2022-07-23 (×2): qty 1

## 2022-07-23 MED ORDER — CARVEDILOL 3.125 MG PO TABS
3.1250 mg | ORAL_TABLET | Freq: Two times a day (BID) | ORAL | Status: DC
  Administered 2022-07-23 – 2022-07-24 (×4): 3.125 mg via ORAL
  Filled 2022-07-23 (×4): qty 1

## 2022-07-23 MED ORDER — ACETAMINOPHEN 325 MG PO TABS
650.0000 mg | ORAL_TABLET | Freq: Four times a day (QID) | ORAL | Status: DC | PRN
  Administered 2022-07-23: 650 mg via ORAL
  Filled 2022-07-23: qty 2

## 2022-07-23 MED ORDER — HYDRALAZINE HCL 20 MG/ML IJ SOLN: 5.0000 mg | Freq: Four times a day (QID) | INTRAMUSCULAR | Status: DC | PRN

## 2022-07-23 MED ORDER — ACETAMINOPHEN 325 MG PO TABS
650.0000 mg | ORAL_TABLET | ORAL | Status: DC | PRN
  Administered 2022-07-23: 650 mg via ORAL
  Filled 2022-07-23: qty 2

## 2022-07-23 MED ORDER — ACETAMINOPHEN 160 MG/5ML PO SOLN: 650.0000 mg | ORAL | Status: DC | PRN

## 2022-07-23 MED ORDER — SENNOSIDES-DOCUSATE SODIUM 8.6-50 MG PO TABS: 1.0000 | ORAL_TABLET | Freq: Every evening | ORAL | Status: DC | PRN

## 2022-07-23 MED ORDER — ALLOPURINOL 100 MG PO TABS
100.0000 mg | ORAL_TABLET | Freq: Every day | ORAL | Status: DC
  Administered 2022-07-23 – 2022-07-24 (×2): 100 mg via ORAL
  Filled 2022-07-23 (×3): qty 1

## 2022-07-23 MED ORDER — ASPIRIN 81 MG PO TBEC
81.0000 mg | DELAYED_RELEASE_TABLET | Freq: Every day | ORAL | Status: DC
  Administered 2022-07-23 – 2022-07-24 (×2): 81 mg via ORAL
  Filled 2022-07-23 (×2): qty 1

## 2022-07-23 MED ORDER — HYDRALAZINE HCL 25 MG PO TABS
25.0000 mg | ORAL_TABLET | Freq: Two times a day (BID) | ORAL | Status: DC
  Administered 2022-07-23 – 2022-07-24 (×4): 25 mg via ORAL
  Filled 2022-07-23 (×4): qty 1

## 2022-07-23 MED ORDER — ISOSORBIDE MONONITRATE ER 60 MG PO TB24
60.0000 mg | ORAL_TABLET | Freq: Every day | ORAL | Status: DC
  Administered 2022-07-23 – 2022-07-24 (×2): 60 mg via ORAL
  Filled 2022-07-23 (×3): qty 1

## 2022-07-23 MED ORDER — AMLODIPINE BESYLATE 5 MG PO TABS
5.0000 mg | ORAL_TABLET | Freq: Every day | ORAL | Status: DC
  Administered 2022-07-23 – 2022-07-24 (×2): 5 mg via ORAL
  Filled 2022-07-23 (×2): qty 1

## 2022-07-23 MED ORDER — ACETAMINOPHEN 650 MG RE SUPP: 650.0000 mg | RECTAL | Status: DC | PRN

## 2022-07-23 MED ORDER — NITROGLYCERIN 0.4 MG SL SUBL: 0.4000 mg | SUBLINGUAL_TABLET | SUBLINGUAL | Status: DC | PRN

## 2022-07-23 MED ORDER — TRAZODONE HCL 100 MG PO TABS
100.0000 mg | ORAL_TABLET | Freq: Every day | ORAL | Status: DC
  Administered 2022-07-23 (×2): 100 mg via ORAL
  Filled 2022-07-23 (×2): qty 1

## 2022-07-23 MED ORDER — HEPARIN SODIUM (PORCINE) 5000 UNIT/ML IJ SOLN
5000.0000 [IU] | Freq: Three times a day (TID) | INTRAMUSCULAR | Status: DC
  Administered 2022-07-23 – 2022-07-24 (×5): 5000 [IU] via SUBCUTANEOUS
  Filled 2022-07-23 (×5): qty 1

## 2022-07-23 MED ORDER — STROKE: EARLY STAGES OF RECOVERY BOOK
Freq: Once | Status: AC
  Filled 2022-07-23: qty 1

## 2022-07-23 MED ORDER — SODIUM BICARBONATE 650 MG PO TABS
650.0000 mg | ORAL_TABLET | Freq: Two times a day (BID) | ORAL | Status: DC
  Administered 2022-07-23 – 2022-07-24 (×4): 650 mg via ORAL
  Filled 2022-07-23 (×6): qty 1

## 2022-07-23 MED ORDER — ACETAMINOPHEN 325 MG PO TABS: 650.0000 mg | ORAL_TABLET | ORAL | Status: DC | PRN

## 2022-07-23 MED ORDER — CLOPIDOGREL BISULFATE 75 MG PO TABS
75.0000 mg | ORAL_TABLET | Freq: Every day | ORAL | Status: DC
  Administered 2022-07-23 – 2022-07-24 (×2): 75 mg via ORAL
  Filled 2022-07-23 (×3): qty 1

## 2022-07-23 NOTE — Evaluation (Signed)
Physical Therapy Evaluation Patient Details Name: Ryin Schillo MRN: [id number] DOB: Sep 04, 1965 Today's Date: 07/23/2022  History of Present Illness  Pt is a 57 y.o. F who presents 07/22/2022 with double vision and blurred vision that started several days ago. MRI showing small punctuate area of acute infarction in the left pons. Significant PMH: COPD, CAD, prior stroke, seizure disorder, noncompliant to her medications.  Clinical Impression  PTA, pt lives with her brother and cousin in a town home and works as a Electrical engineer for [employer]. However, pt admits to difficulty ambulating longer distances and negotiating stairs for "a long time." Unsure how she was navigating community distances across a college campus. Pt reports left eye blurriness, however, visual field intact in all quadrants. Pt ambulating 70 ft with no assistive device at a supervision level. Further distance limited due to fatigue. SpO2 99% on RA, HR 80's. Pt presents with decreased cardiopulmonary endurance, which is suspected to be present at baseline. Will continue to follow acutely. No PT follow up anticipated, but will continue to assess.     Recommendations for follow up therapy are one component of a multi-disciplinary discharge planning process, led by the attending physician.  Recommendations may be updated based on patient status, additional functional criteria and insurance authorization.  Follow Up Recommendations       Assistance Recommended at Discharge PRN  Patient can return home with the following  Assistance with cooking/housework;Assist for transportation;Help with stairs or ramp for entrance    Equipment Recommendations None recommended by PT  Recommendations for Other Services       Functional Status Assessment Patient has had a recent decline in their functional status and demonstrates the ability to make significant improvements in function in a reasonable and predictable amount of time.      Precautions / Restrictions Precautions Precautions: Fall Precaution Comments: low fall risk Restrictions Weight Bearing Restrictions: No      Mobility  Bed Mobility Overal bed mobility: Modified Independent                  Transfers Overall transfer level: Independent Equipment used: None                    Ambulation/Gait Ambulation/Gait assistance: Supervision Gait Distance (Feet): 70 Feet Assistive device: None Gait Pattern/deviations: Step-to pattern, Step-through pattern, Decreased stride length Gait velocity: decreased     General Gait Details: Alternating step to vs step through pattern, reaching for single UE support (seeemingly more due to fatigue than imbalance), fatigues quickly  Stairs            Wheelchair Mobility    Modified Rankin (Stroke Patients Only) Modified Rankin (Stroke Patients Only) Pre-Morbid Rankin Score: No significant disability Modified Rankin: Moderately severe disability     Balance                                             Pertinent Vitals/Pain Pain Assessment Pain Assessment: No/denies pain    Home Living Family/patient expects to be discharged to:: Private residence Living Arrangements: Other relatives (brother, cousin) Available Help at Discharge: Family Type of Home: Apartment       Alternate Level Stairs-Number of Steps:  (flight) Home Layout: Two level;1/2 bath on main level (kitchen on main level, bed/bath upstairs)        Prior Function Prior Level of Function :  Independent/Modified Independent;Driving;Working/employed             Mobility Comments: works as Electrical engineer at [employer]        Extremity/Trunk Assessment   Upper Extremity Assessment Upper Extremity Assessment: Overall WFL for tasks assessed    Lower Extremity Assessment Lower Extremity Assessment: RLE deficits/detail;LLE deficits/detail RLE Deficits / Details: Hip flexion  4/5, otherwise 5/5 LLE Deficits / Details: Hip flexion 4/5, otherwise 5/5    Cervical / Trunk Assessment Cervical / Trunk Assessment: Normal  Communication   Communication: No difficulties  Cognition Arousal/Alertness: Awake/alert Behavior During Therapy: WFL for tasks assessed/performed Overall Cognitive Status: Impaired/Different from baseline Area of Impairment: Awareness                           Awareness: Emergent   General Comments: Cognition WFL for basic mobility, pt unable to recall name of campus she works for, able to state deficits but does not seem to have insight/awareness into impact. Could be close to her baseline        General Comments      Exercises     Assessment/Plan    PT Assessment Patient needs continued PT services  PT Problem List Decreased activity tolerance;Decreased balance;Decreased mobility;Cardiopulmonary status limiting activity       PT Treatment Interventions DME instruction;Gait training;Stair training;Functional mobility training;Therapeutic activities;Therapeutic exercise;Balance training;Patient/family education    PT Goals (Current goals can be found in the Care Plan section)  Acute Rehab PT Goals Patient Stated Goal: did not state PT Goal Formulation: With patient Time For Goal Achievement: 08/06/22 Potential to Achieve Goals: Good    Frequency Min 3X/week     Co-evaluation               AM-PAC PT "6 Clicks" Mobility  Outcome Measure Help needed turning from your back to your side while in a flat bed without using bedrails?: None Help needed moving from lying on your back to sitting on the side of a flat bed without using bedrails?: None Help needed moving to and from a bed to a chair (including a wheelchair)?: None Help needed standing up from a chair using your arms (e.g., wheelchair or bedside chair)?: A Little Help needed to walk in hospital room?: A Little Help needed climbing 3-5 steps with a  railing? : A Little 6 Click Score: 21    End of Session   Activity Tolerance: Patient tolerated treatment well Patient left: in bed;with call bell/phone within reach;with bed alarm set Nurse Communication: Mobility status PT Visit Diagnosis: Unsteadiness on feet (R26.81);Difficulty in walking, not elsewhere classified (R26.2)    Time: 1610-9604 PT Time Calculation (min) (ACUTE ONLY): 15 min   Charges:   PT Evaluation $PT Eval Low Complexity: 1 Low          Lillia Pauls, PT, DPT Acute Rehabilitation Services Office [phone]   Norval Morton 07/23/2022, 9:24 AM

## 2022-07-23 NOTE — TOC CAGE-AID Note (Signed)
Transition of Care [facility]) - CAGE-AID Screening   Patient Details  Name: Colleen Curtis MRN: [id number] Date of Birth: Jan 02, 1966  [facility]) CM/SW Contact:    Kermit Balo, RN Phone Number: 07/23/2022, 1:58 PM   Clinical Narrative:  Pt denied the need for substance abuse resources.  CAGE-AID Screening:    Have You Ever Felt You Ought to Cut Down on Your Drinking or Drug Use?: No Have People Annoyed You By Critizing Your Drinking Or Drug Use?: No Have You Felt Bad Or Guilty About Your Drinking Or Drug Use?: No Have You Ever Had a Drink or Used Drugs First Thing In The Morning to Steady Your Nerves or to Get Rid of a Hangover?: No CAGE-AID Score: 0  Substance Abuse Education Offered: Yes (refused)

## 2022-07-23 NOTE — Progress Notes (Signed)
OT Cancellation Note  Patient Details Name: Tena Linebaugh MRN: [id number] DOB: 1965/10/10   Cancelled Treatment:    Reason Eval/Treat Not Completed: Patient at procedure or test/ unavailable- pt at MRI, will follow and see as able.  Barry Brunner, OT Acute Rehabilitation Services Office [phone]   Chancy Milroy 07/23/2022, 11:02 AM

## 2022-07-23 NOTE — ED Notes (Addendum)
Signing pad not working. Pt gave verbal consent to transport  to [facility]. [facility] at bedside.

## 2022-07-23 NOTE — Progress Notes (Signed)
Pt is with mri.

## 2022-07-23 NOTE — Consult Note (Signed)
Neurology Consultation  Reason for Consult: Blurred vision, stroke Referring Physician: Dr. Estell Harpin  CC: Blurred vision and double vision  History is obtained from: Patient, chart  HPI: Colleen Curtis is a 57 y.o. female past medical history of COPD, CAD, hypertension, prior stroke, seizure disorder, noncompliant to medications-does not see doctors regularly and is noncompliant to her medications, presenting for evaluation of double vision and blurred vision that started sometime on Saturday, 07/20/2022.  She reports that she had sudden onset of seeing blurred and double when she looked to the left.  This has improved and nearly completely resolved but due to this being present for a long time, she came to the emergency department.  She was evaluated with an MRI of the brain for concern for a posterior circulation stroke and it revealed a small punctate area of acute infarction in the pons. Neurology was consulted for further recommendations and evaluation.    LKW: 3 days ago IV thrombolysis given?: no, outside the window EVT: No-outside the window Premorbid modified Rankin scale (mRS): 0   ROS: Full ROS was performed and is negative except as noted in the HPI.  Past Medical History:  Diagnosis Date   Bipolar disorder    Cancer    COPD (chronic obstructive pulmonary disease)    Coronary artery disease    Diverticulitis    Hypertension    Myocardial infarction    Seizure    Stroke      Family History  Problem Relation Age of Onset   Heart disease Mother    Hypertension Mother    Heart disease Father    Diabetes Mellitus II Father    Arrhythmia Brother      Social History:   reports that she has been smoking cigars. She has never used smokeless tobacco. She reports current drug use. Drug: Marijuana. She reports that she does not drink alcohol.  Medications  Current Facility-Administered Medications:    [START ON 07/24/2022]  stroke: early stages of recovery  book, , Does not apply, Once, Lurline Del, MD   acetaminophen (TYLENOL) tablet 650 mg, 650 mg, Oral, Q4H PRN **OR** acetaminophen (TYLENOL) 160 MG/5ML solution 650 mg, 650 mg, Per Tube, Q4H PRN **OR** acetaminophen (TYLENOL) suppository 650 mg, 650 mg, Rectal, Q4H PRN, Lurline Del, MD   allopurinol (ZYLOPRIM) tablet 100 mg, 100 mg, Oral, Daily, Skip Mayer A, MD   amLODipine (NORVASC) tablet 5 mg, 5 mg, Oral, Daily, Lurline Del, MD   aspirin EC tablet 81 mg, 81 mg, Oral, Daily, Skip Mayer A, MD   atorvastatin (LIPITOR) tablet 80 mg, 80 mg, Oral, QHS, Skip Mayer A, MD   carvedilol (COREG) tablet 3.125 mg, 3.125 mg, Oral, BID WC, Skip Mayer A, MD   clopidogrel (PLAVIX) tablet 75 mg, 75 mg, Oral, Daily, Skip Mayer A, MD   heparin injection 5,000 Units, 5,000 Units, Subcutaneous, Q8H, Skip Mayer A, MD   hydrALAZINE (APRESOLINE) injection 5 mg, 5 mg, Intravenous, Q6H PRN, John Giovanni, MD   hydrALAZINE (APRESOLINE) tablet 25 mg, 25 mg, Oral, BID, Skip Mayer A, MD   isosorbide mononitrate (IMDUR) 24 hr tablet 60 mg, 60 mg, Oral, Daily, Skip Mayer A, MD   nitroGLYCERIN (NITROSTAT) SL tablet 0.4 mg, 0.4 mg, Sublingual, Q5 min PRN, Lurline Del, MD   senna-docusate (Senokot-S) tablet 1 tablet, 1 tablet, Oral, QHS PRN, Skip Mayer A, MD   sodium bicarbonate tablet 650 mg, 650 mg, Oral, BID, Lurline Del, MD   traZODone (  DESYREL) tablet 100 mg, 100 mg, Oral, QHS, John Giovanni, MD   Exam: Current vital signs: BP (!) 193/120 (BP Location: Right Arm)   Pulse 69   Temp 97.9 F (36.6 C) (Oral)   Resp 18   Ht 5\' 3"  (1.6 m)   Wt 104.3 kg   SpO2 98%   BMI 40.74 kg/m  Vital signs in last 24 hours: Temp:  [97.8 F (36.6 C)-97.9 F (36.6 C)] 97.9 F (36.6 C) (04/16 0202) Pulse Rate:  [64-75] 69 (04/16 0202) Resp:  [17-20] 18 (04/16 0202) BP: (168-203)/(92-137) 193/120 (04/16 0202) SpO2:  [96  %-100 %] 98 % (04/16 0202) Weight:  [104.3 kg] 104.3 kg (04/15 2019)  GENERAL: Awake, alert in NAD HEENT: - Normocephalic and atraumatic, dry mm, no LN++, no Thyromegally LUNGS - Clear to auscultation bilaterally with no wheezes CV - S1S2 RRR, no m/r/g, equal pulses bilaterally. ABDOMEN - Soft, nontender, nondistended with normoactive BS Ext: warm, well perfused, intact peripheral pulses  NEURO:  Mental Status: AA&Ox3  Language: speech is nondysarthric.  Naming, repetition, fluency, and comprehension intact. Cranial Nerves: PERRL EOMI but reports mild diplopia on leftward gaze, visual fields full, no facial asymmetry, facial sensation intact, hearing intact, tongue/uvula/soft palate midline, normal sternocleidomastoid and trapezius muscle strength. No evidence of tongue atrophy or fibrillations Motor: 5/5 with no drift in any of the 4 extremities Tone: is normal and bulk is normal Sensation- Intact to light touch bilaterally Coordination: FTN intact bilaterally, no ataxia in BLE. Gait- deferred  NIHSS--0  Labs I have reviewed labs in epic and the results pertinent to this consultation are:  CBC    Component Value Date/Time   WBC 10.3 07/22/2022 2040   RBC 4.96 07/22/2022 2040   HGB 13.5 07/22/2022 2040   HCT 41.0 07/22/2022 2040   PLT 261 07/22/2022 2040   MCV 82.7 07/22/2022 2040   MCH 27.2 07/22/2022 2040   MCHC 32.9 07/22/2022 2040   RDW 13.9 07/22/2022 2040   LYMPHSABS 2.7 06/05/2022 2344   MONOABS 0.7 06/05/2022 2344   EOSABS 0.3 06/05/2022 2344   BASOSABS 0.1 06/05/2022 2344    CMP     Component Value Date/Time   NA 140 07/22/2022 2040   K 4.0 07/22/2022 2040   CL 114 (H) 07/22/2022 2040   CO2 15 (L) 07/22/2022 2040   GLUCOSE 93 07/22/2022 2040   BUN 34 (H) 07/22/2022 2040   CREATININE 3.49 (H) 07/22/2022 2040   CALCIUM 8.6 (L) 07/22/2022 2040   CALCIUM 7.8 (L) 06/07/2022 0114   PROT 6.4 (L) 06/05/2022 2344   ALBUMIN 2.8 (L) 06/07/2022 0114   AST 15  06/05/2022 2344   ALT 9 06/05/2022 2344   ALKPHOS 70 06/05/2022 2344   BILITOT 0.6 06/05/2022 2344   GFRNONAA 14 (L) 07/22/2022 2040    Lipid Panel     Component Value Date/Time   CHOL 261 (H) 06/06/2022 0505   TRIG 223 (H) 06/06/2022 0505   HDL 43 06/06/2022 0505   CHOLHDL 6.1 06/06/2022 0505   VLDL 45 (H) 06/06/2022 0505   LDLCALC 173 (H) 06/06/2022 0505    Imaging I have reviewed the images obtained: MRI examination of the brain-small area of acute ischemia in the dorsal left pons.  Assessment: 57 year old with uncontrolled hypertension, CAD, amongst other risk factors presenting for evaluation of sudden onset of blurred and double vision mostly when looking to the left.  MRI of the brain reveals subacute stroke in the dorsal left pons. 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 Reiterated importance of compliance to medications.  This will need to be reiterated on an ongoing basis. Further recommendations after testing results become available. Stroke team to follow Preliminary plan discussed with Dr. Estell Harpin  -- Milon Dikes, MD Neurologist [facility] Pager: [phone]

## 2022-07-23 NOTE — Progress Notes (Signed)
Pt is with speech.

## 2022-07-23 NOTE — Progress Notes (Signed)
Attempted carotid duplex, patient refusing at this time stating she is tired and to come back later.  Will re attempt at schedule permits.   Results can be found under chart review under CV PROC. 07/23/2022 1:46 PM Myleigh Amara RVT, RDMS

## 2022-07-23 NOTE — TOC Initial Note (Signed)
Transition of Care [facility]) - Initial/Assessment Note    Patient Details  Name: Colleen Curtis MRN: [id number] Date of Birth: February 09, 1966  [facility] CM/SW Contact:    Kermit Balo, RN Phone Number: 07/23/2022, 1:49 PM  Clinical Narrative:                 Pt is from home with her brother and cousin.  Pt drives self as needed but cousin could assist.  Pt manges her own medications. She denies any issues.  Pt is without a PCP. CM was able to get her an appointment and placed the information on the AVS. Pt asked for assistance in finding a MD.  Pt has transportation home when medically ready for d/c.   Expected Discharge Plan: Home/Self Care Barriers to Discharge: Continued Medical Work up   Patient Goals and CMS Choice     Choice offered to / list presented to : Patient      Expected Discharge Plan and Services   Discharge Planning Services: CM Consult   Living arrangements for the past 2 months: Single Family Home                                      Prior Living Arrangements/Services Living arrangements for the past 2 months: Single Family Home Lives with:: Siblings, Relatives Patient language and need for interpreter reviewed:: Yes Do you feel safe going back to the place where you live?: Yes          Current home services: DME (cane) Criminal Activity/Legal Involvement Pertinent to Current Situation/Hospitalization: No - Comment as needed  Activities of Daily Living Home Assistive Devices/Equipment: None ADL Screening (condition at time of admission) Patient's cognitive ability adequate to safely complete daily activities?: No Is the patient deaf or have difficulty hearing?: No Does the patient have difficulty seeing, even when wearing glasses/contacts?: No Does the patient have difficulty concentrating, remembering, or making decisions?: No Patient able to express need for assistance with ADLs?: No Does the patient have difficulty  dressing or bathing?: Yes Independently performs ADLs?: No Communication: Needs assistance Is this a change from baseline?: Change from baseline, expected to last >3 days Dressing (OT): Needs assistance Is this a change from baseline?: Change from baseline, expected to last >3 days Grooming: Needs assistance Is this a change from baseline?: Change from baseline, expected to last >3 days Feeding: Independent Bathing: Needs assistance Is this a change from baseline?: Change from baseline, expected to last >3 days Toileting: Needs assistance Is this a change from baseline?: Change from baseline, expected to last >3days In/Out Bed: Needs assistance Is this a change from baseline?: Change from baseline, expected to last >3 days Walks in Home: Needs assistance Is this a change from baseline?: Change from baseline, expected to last >3 days Does the patient have difficulty walking or climbing stairs?: Yes Weakness of Legs: Both Weakness of Arms/Hands: None  Permission Sought/Granted                  Emotional Assessment Appearance:: Appears stated age Attitude/Demeanor/Rapport: Engaged Affect (typically observed): Accepting Orientation: : Oriented to Self, Oriented to Place, Oriented to  Time, Oriented to Situation   Psych Involvement: No (comment)  Admission diagnosis:  Acute CVA (cerebrovascular accident) [I63.9] Cerebral infarction, unspecified mechanism [I63.9] Patient Active Problem List   Diagnosis Date Noted   Acute CVA (cerebrovascular accident) 07/22/2022  Acute coronary syndrome 04/04/2020   History of DVT (deep vein thrombosis) 09/10/2019   History of heart artery stent 09/10/2019   History of MI (myocardial infarction) 09/10/2019   History of pulmonary embolism 09/10/2019   Hyperlipidemia 09/10/2019   Hypertension 09/10/2019   Stage 3 chronic kidney disease 09/10/2019   PCP:  Pcp, No Pharmacy:   [id number] - [place], [place] - [address]  AT [facility] [place] [address] Phone: [phone] Fax: [phone]  [facility] [address] [place] [address] Phone: [phone] Fax: [phone]     Social Determinants of Health (SDOH) Social History: SDOH Screenings   Food Insecurity: Food Insecurity Present (07/23/2022)  Housing: Low Risk  (07/23/2022)  Transportation Needs: No Transportation Needs (07/23/2022)  Utilities: Not At Risk (07/23/2022)  Tobacco Use: High Risk (07/22/2022)   SDOH Interventions:     Readmission Risk Interventions     No data to display

## 2022-07-23 NOTE — Progress Notes (Signed)
Colleen Curtis  ZOX:[id number] DOB: 07/16/1965 DOA: 07/22/2022 PCP: Pcp, No    Brief Narrative:  57 year old with a history of bipolar disorder, medication and doctor visit noncompliance, COPD, HTN, CAD status post stenting x4 (in [place]), CVA, seizure disorder, CKD stage IIIb, tobacco abuse, gout, morbid obesity, and admission 2/29-3/1 with ACS and AKI on CKD who presented to the ED 07/22/2022 with blurry vision/double vision x 3 days associated with dizziness.  In the ER an MRI of the brain revealed a small focus of acute ischemia at the dorsal left pons which could be a cause of internuclear ophthalmoplegia.  Consultants:  Neurology  Goals of Care:  Code Status: Full Code   DVT prophylaxis: Subcutaneous heparin  Interim Hx: Afebrile since admission.  Vital signs stable.  Blood pressure has been intermittently elevated.  No new complaints at the time of my exam.  Reports that her diplopia has nearly resolved.  Denies chest pain or shortness of breath.  Assessment & Plan:  Acute small CVA -Resultant generalized blurry vision -MRI brain noted small focus of acute ischemia of the dorsal left pons -MRa head limited by motion but notes diffuse atherosclerotic disease with apparent occlusion of the left PCA at the P2 segment, moderate stenosis in the right cavernous ICA, and multifocal moderate to severe stenosis in the anterior and posterior circulation -TTE pending -Carotid Dopplers pending -medical treatment: continue aspirin and Plavix (patient previously noncompliant with these medications) -LDL 202 likely due to medication noncompliance -A1c 5.4 -PT/OT/SLP  Uncontrolled HTN No need for permissive hypertension as patient is greater than 72 hours from the onset of her neurologic symptoms -titrate blood pressure medications -continue to encourage strict compliance at home which has been a challenge in the past  Hyperlipidemia Noncompliant with home medical therapy -resume  atorvastatin and encourage compliance  Bipolar disorder Resume usual home medications  Nonobstructive hypertrophic cardiomyopathy Follow-up TTE pending -noncompliant with chronic medical therapy  COPD Well compensated at present  CAD status post MI with stents Continue aspirin and Plavix -discharged from hospital 3/1 after treatment for ACS with medical therapy only  Seizure disorder Does not apparently require chronic AEDs  Progressive CKD stage IIIb Creatinine appears to be at her recent baseline of approximately 3.0-3.5 - evaluated by Nephrology during recent admission -likely the consequence of longstanding uncontrolled HTN -follow-up with nephrology in outpatient setting as previously advised - unremarkable SPEP 06/07/22  Recent Labs  Lab 07/22/22 2040 07/23/22 0424  CREATININE 3.49* 3.53*   Tobacco abuse Counseled patient on absolute need to discontinue smoking  Morbid obesity - Body mass index is 40.74 kg/m.  Gout Continue usual home medications   Family Communication: No family present at time of exam Disposition: Anticipate discharge home when CVA workup complete   Objective: Blood pressure (!) 140/82, pulse 67, temperature 98.2 F (36.8 C), temperature source Oral, resp. rate 14, height  (1.6 m), weight 104.3 kg, SpO2 99 %. No intake or output data in the 24 hours ending 07/23/22 0905 Filed Weights   07/22/22 2019  Weight: 104.3 kg    Examination: General: No acute respiratory distress Lungs: Clear to auscultation bilaterally without wheezes or crackles Cardiovascular: Regular rate and rhythm without murmur  Abdomen: Nontender, nondistended, soft, bowel sounds positive, no rebound, no ascites, no appreciable mass Extremities: Trace edema bilateral lower extremities  CBC: Recent Labs  Lab 07/22/22 2040 07/23/22 0424  WBC 10.3 10.1  HGB 13.5 12.8  HCT 41.0 39.5  MCV 82.7 83.7  PLT  261 245   Basic Metabolic Panel: Recent Labs  Lab  07/22/22 2040 07/23/22 0424  NA 140  --   K 4.0  --   CL 114*  --   CO2 15*  --   GLUCOSE 93  --   BUN 34*  --   CREATININE 3.49* 3.53*  CALCIUM 8.6*  --    GFR: Estimated Creatinine Clearance: 19.8 mL/min (A) (by C-G formula based on SCr of 3.53 mg/dL (H)).   Scheduled Meds:  [START ON 07/24/2022]  stroke: early stages of recovery book   Does not apply Once   allopurinol  100 mg Oral Daily   amLODipine  5 mg Oral Daily   aspirin EC  81 mg Oral Daily   atorvastatin  80 mg Oral QHS   carvedilol  3.125 mg Oral BID WC   clopidogrel  75 mg Oral Daily   heparin  5,000 Units Subcutaneous Q8H   hydrALAZINE  25 mg Oral BID   isosorbide mononitrate  60 mg Oral Daily   sodium bicarbonate  650 mg Oral BID   traZODone  100 mg Oral QHS     LOS: 1 day   Lonia Blood, MD [facility] Office  [phone] Pager - Text Page per Amion  If 7PM-7AM, please contact night-coverage per Amion 07/23/2022, 9:05 AM

## 2022-07-23 NOTE — Progress Notes (Addendum)
STROKE TEAM PROGRESS NOTE   INTERVAL HISTORY No family at the bedside.  Patient presented yesterday to the hospital for evaluation of blurry vision and double vision.  This morning she endorses that she is continuing to have some double vision and blurry vision but does seem to be improving. She also endorses that she has not been taking her aspirin Plavix or atorvastatin and does not follow-up with any physicians Educated her on the importance of taking her medications as prescribed and to follow-up with the providers Will continue her aspirin and Plavix and her atorvastatin.  She will also need follow-up with cardiology  Vitals:   07/23/22 0112 07/23/22 0202 07/23/22 0337 07/23/22 0743  BP: (!) 175/120 (!) 193/120 (!) 161/97 (!) 140/82  Pulse: 75 69 68 67  Resp: 18 18 18 14   Temp:  97.9 F (36.6 C) 98.6 F (37 C) 98.2 F (36.8 C)  TempSrc:  Oral  Oral  SpO2: 97% 98% 96% 99%  Weight:      Height:       CBC:  Recent Labs  Lab 07/22/22 2040 07/23/22 0424  WBC 10.3 10.1  HGB 13.5 12.8  HCT 41.0 39.5  MCV 82.7 83.7  PLT 261 245   Basic Metabolic Panel:  Recent Labs  Lab 07/22/22 2040 07/23/22 0424  NA 140  --   K 4.0  --   CL 114*  --   CO2 15*  --   GLUCOSE 93  --   BUN 34*  --   CREATININE 3.49* 3.53*  CALCIUM 8.6*  --    Lipid Panel:  Recent Labs  Lab 07/23/22 0424  CHOL 273*  TRIG 149  HDL 41  CHOLHDL 6.7  VLDL 30  LDLCALC 161*   HgbA1c:  Recent Labs  Lab 07/23/22 0424  HGBA1C 5.4   Urine Drug Screen: No results for input(s): "LABOPIA", "COCAINSCRNUR", "LABBENZ", "AMPHETMU", "THCU", "LABBARB" in the last 168 hours.  Alcohol Level No results for input(s): "ETH" in the last 168 hours.  IMAGING past 24 hours MR BRAIN WO CONTRAST  Result Date: 07/22/2022 CLINICAL DATA:  Diplopia EXAM: MRI HEAD AND ORBITS WITHOUT CONTRAST TECHNIQUE: Multiplanar, multi-echo pulse sequences of the brain and surrounding structures were acquired without intravenous  contrast. Multiplanar, multi-echo pulse sequences of the orbits and surrounding structures were acquired including fat saturation techniques, without intravenous contrast administration. COMPARISON:  None Available. FINDINGS: MRI HEAD FINDINGS Brain: Small focus of acute ischemia at the dorsal left pons. No other acute lesion. There is confluent hyperintense T2-weighted signal within the periventricular and deep white matter. Mild generalized volume loss. No acute hemorrhage. Vascular: Normal flow voids. Skull and upper cervical spine: Normal marrow signal. Other: None. MRI ORBITS FINDINGS Orbits: No orbital mass or evidence of inflammation. Normal appearance of the globes, optic nerve-sheath complexes, extraocular muscles, orbital fat and lacrimal glands. Visualized sinuses: Clear. Soft tissues: Normal. IMPRESSION: 1. Small focus of acute ischemia at the dorsal left pons, at the paramedian pontine reticular formation, which may cause internuclear ophthalmoplegia. 2. Normal MRI of the orbits. 3. Findings of chronic ischemic microangiopathy. Electronically Signed   By: Deatra Robinson M.D.   On: 07/22/2022 22:35   MR ORBITS WO CONTRAST  Result Date: 07/22/2022 CLINICAL DATA:  Diplopia EXAM: MRI HEAD AND ORBITS WITHOUT CONTRAST TECHNIQUE: Multiplanar, multi-echo pulse sequences of the brain and surrounding structures were acquired without intravenous contrast. Multiplanar, multi-echo pulse sequences of the orbits and surrounding structures were acquired including fat saturation techniques, without intravenous  contrast administration. COMPARISON:  None Available. FINDINGS: MRI HEAD FINDINGS Brain: Small focus of acute ischemia at the dorsal left pons. No other acute lesion. There is confluent hyperintense T2-weighted signal within the periventricular and deep white matter. Mild generalized volume loss. No acute hemorrhage. Vascular: Normal flow voids. Skull and upper cervical spine: Normal marrow signal. Other: None.  MRI ORBITS FINDINGS Orbits: No orbital mass or evidence of inflammation. Normal appearance of the globes, optic nerve-sheath complexes, extraocular muscles, orbital fat and lacrimal glands. Visualized sinuses: Clear. Soft tissues: Normal. IMPRESSION: 1. Small focus of acute ischemia at the dorsal left pons, at the paramedian pontine reticular formation, which may cause internuclear ophthalmoplegia. 2. Normal MRI of the orbits. 3. Findings of chronic ischemic microangiopathy. Electronically Signed   By: Deatra Robinson M.D.   On: 07/22/2022 22:35    PHYSICAL EXAM  Temp:  [97.8 F (36.6 C)-98.6 F (37 C)] 98.2 F (36.8 C) (04/16 0743) Pulse Rate:  [64-75] 67 (04/16 0743) Resp:  [14-20] 14 (04/16 0743) BP: (140-203)/(82-137) 140/82 (04/16 0743) SpO2:  [96 %-100 %] 99 % (04/16 0743) Weight:  [104.3 kg] 104.3 kg (04/15 2019)  General - Well nourished, well developed, in no apparent distress. Cardiovascular - Regular rhythm and rate.  Mental Status -  Level of arousal and orientation to time, place, and person were intact.  She is edentulous Language including expression, naming, repetition, comprehension was assessed and found intact. Attention span and concentration were normal. Recent and remote memory were intact. Fund of Knowledge was assessed and was intact.  Cranial Nerves II - XII - II - Visual field intact OU.   III, IV, VI - Extraocular movements intact. Subjective reports of diplopia with leftward gaze V - Facial sensation intact bilaterally. VII - Facial movement intact bilaterally. VIII - Hearing & vestibular intact bilaterally. X - Palate elevates symmetrically. XI - Chin turning & shoulder shrug intact bilaterally. XII - Tongue protrusion intact.  Motor Strength - The patient's strength was normal in all extremities and pronator drift was absent.  Bulk was normal and fasciculations were absent.   Motor Tone - Muscle tone was assessed at the neck and appendages and was  normal.  Sensory - Light touch, temperature/pinprick were assessed and were symmetrical.    Coordination - The patient had normal movements in the hands and feet with no ataxia or dysmetria.  Tremor was absent.  Gait and Station - deferred.  ASSESSMENT/PLAN [name] is a 57 y.o. female with history of stroke, seizure, hypertension, hyperlipidemia, CAD S/PE stent placement, nonobstructive hypertrophic cardiomyopathy COPD, CKD, bipolar, tobacco use (patient is noncompliant with medications nor does she see providers regularly), presents to the hospital for evaluation of double vision and blurry vision that started on Saturday prior to admission.  MRI revealed acute infarct in the pons  Stroke: Small acute left pons ischemic infarct, likely small vessel disease due to uncontrolled risk factors and noncompliance with meds MRI small acute ischemic infarct in left dorsal pons.  Chronic ischemic microangiopathy MRA diffuse athero with apparent occlusion of the left PCA at the P2 segment, with possible distal reconstitution. severe stenosis in several distal left MCA branches, severe stenosis at the origin of the left P1 segment, severe stenosis at the origin of the right SCA. Moderate stenosis in the right cavernous ICA and mild stenosis in the left cavernous ICA and right supraclinoid ICA, and moderate stenosis in the mid basilar artery Carotid Doppler pending 2D Echo EF 40% LDL 202  HgbA1c 5.4 UDS pending VTE prophylaxis -heparin subcu No antithrombotics due to noncompliance (ASA and plavix listed as home meds) prior to admission, now on aspirin 81 mg and Plavix 75 mg daily.  Therapy recommendations: Pending Disposition: Pending  Hypertension Nonobstructive hypertrophic cardiomyopathy CAD s/p stents Home meds: Coreg 3.125 Mg twice daily, zotepine 5 mg, hydralazine 25 mg, Imdur 60 mg daily - noncompliance Stable on the high end Now home BP meds resumed TTE EF 40% (previous  05/2022 35-40%) Long-term BP goal normotensive  Hyperlipidemia Home meds: Atorvastatin 80 (noncompliance) LDL 202, goal < 70 Nowo n lipitor 80 Continue statin at discharge  Tobacco abuse Current smoker Smoking cessation counseling provided Pt is willing to quit  Other Stroke Risk Factors Obesity, Body mass index is 40.74 kg/m., BMI >/= 30 associated with increased stroke risk, recommend weight loss, diet and exercise as appropriate  Hx stroke/TIA, details not clear  Other Active Problems COPD Hx of seizures not on any AEDs CKD IV, Cre 3.45->3.53 Gout Bipolar  Hospital day # 1  Gevena Mart DNP, ACNPC-AG  [facility]  ATTENDING NOTE: I reviewed above note and agree with the assessment and plan. Pt was seen and examined.   Pt lying in bed, no family at the bedside. She is AAOx 3 and pleasant and admitted that she did not take any medication at home. Her stroke likely small vessel disease given the location and uncontrolled risk factors but MRA showed moderate BA stenosis, so the stroke can be large vessel disease also. Resumed her DAPT and statin as well as BP and CHF meds. Smoking cessation education provided. CUS and USD pending. Will follow.   For detailed assessment and plan, please refer to above/below as I have made changes wherever appropriate.   Marvel Plan, MD PhD Stroke Neurology 07/23/2022 3:19 PM  I spent additional inpatient time with the patient, more than 50% of which was spent in counseling and coordination of care, reviewing test results, images and medication, and discussing the diagnosis, treatment plan and potential prognosis. This patient's care requiresreview of multiple databases, neurological assessment, discussion with family, other specialists and medical decision making of high complexity.     To contact Stroke Continuity provider, please refer to [email or web address]. After hours, contact General Neurology

## 2022-07-23 NOTE — Plan of Care (Signed)
  Problem: Education: Goal: Knowledge of disease or condition will improve Outcome: Progressing Goal: Knowledge of secondary prevention will improve (MUST DOCUMENT ALL) Outcome: Progressing Goal: Knowledge of patient specific risk factors will improve (Mark N/A or DELETE if not current risk factor) Outcome: Progressing   Problem: Ischemic Stroke/TIA Tissue Perfusion: Goal: Complications of ischemic stroke/TIA will be minimized Outcome: Progressing   Problem: Coping: Goal: Will verbalize positive feelings about self Outcome: Progressing Goal: Will identify appropriate support needs Outcome: Progressing   Problem: Health Behavior/Discharge Planning: Goal: Ability to manage health-related needs will improve Outcome: Progressing Goal: Goals will be collaboratively established with patient/family Outcome: Progressing   Problem: Self-Care: Goal: Ability to participate in self-care as condition permits will improve Outcome: Progressing Goal: Verbalization of feelings and concerns over difficulty with self-care will improve Outcome: Progressing Goal: Ability to communicate needs accurately will improve Outcome: Progressing   Problem: Nutrition: Goal: Risk of aspiration will decrease Outcome: Progressing Goal: Dietary intake will improve Outcome: Progressing   Problem: Education: Goal: Knowledge of General Education information will improve Description: Including pain rating scale, medication(s)/side effects and non-pharmacologic comfort measures Outcome: Progressing   Problem: Health Behavior/Discharge Planning: Goal: Ability to manage health-related needs will improve Outcome: Progressing   Problem: Clinical Measurements: Goal: Ability to maintain clinical measurements within normal limits will improve Outcome: Progressing Goal: Will remain free from infection Outcome: Progressing Goal: Diagnostic test results will improve Outcome: Progressing Goal: Respiratory  complications will improve Outcome: Progressing Goal: Cardiovascular complication will be avoided Outcome: Progressing   Problem: Activity: Goal: Risk for activity intolerance will decrease Outcome: Progressing   Problem: Nutrition: Goal: Adequate nutrition will be maintained Outcome: Progressing   Problem: Coping: Goal: Level of anxiety will decrease Outcome: Progressing   Problem: Elimination: Goal: Will not experience complications related to bowel motility Outcome: Progressing Goal: Will not experience complications related to urinary retention Outcome: Progressing   Problem: Pain Managment: Goal: General experience of comfort will improve Outcome: Progressing   Problem: Safety: Goal: Ability to remain free from injury will improve Outcome: Progressing   Problem: Skin Integrity: Goal: Risk for impaired skin integrity will decrease Outcome: Progressing   Problem: Education: Goal: Knowledge of disease or condition will improve Outcome: Progressing Goal: Knowledge of secondary prevention will improve (MUST DOCUMENT ALL) Outcome: Progressing Goal: Knowledge of patient specific risk factors will improve (Mark N/A or DELETE if not current risk factor) Outcome: Progressing   Problem: Ischemic Stroke/TIA Tissue Perfusion: Goal: Complications of ischemic stroke/TIA will be minimized Outcome: Progressing   Problem: Coping: Goal: Will verbalize positive feelings about self Outcome: Progressing Goal: Will identify appropriate support needs Outcome: Progressing   Problem: Health Behavior/Discharge Planning: Goal: Ability to manage health-related needs will improve Outcome: Progressing Goal: Goals will be collaboratively established with patient/family Outcome: Progressing   Problem: Self-Care: Goal: Ability to participate in self-care as condition permits will improve Outcome: Progressing Goal: Verbalization of feelings and concerns over difficulty with self-care  will improve Outcome: Progressing Goal: Ability to communicate needs accurately will improve Outcome: Progressing   Problem: Nutrition: Goal: Risk of aspiration will decrease Outcome: Progressing Goal: Dietary intake will improve Outcome: Progressing   

## 2022-07-23 NOTE — Evaluation (Signed)
Speech Language Pathology Evaluation Patient Details Name: Colleen Curtis MRN: [id number] DOB: 22-Sep-1965 Today's Date: 07/23/2022 Time: 0950-1000 SLP Time Calculation (min) (ACUTE ONLY): 10 min  Problem List:  Patient Active Problem List   Diagnosis Date Noted   Acute CVA (cerebrovascular accident) 07/22/2022   Acute coronary syndrome 04/04/2020   History of DVT (deep vein thrombosis) 09/10/2019   History of heart artery stent 09/10/2019   History of MI (myocardial infarction) 09/10/2019   History of pulmonary embolism 09/10/2019   Hyperlipidemia 09/10/2019   Hypertension 09/10/2019   Stage 3 chronic kidney disease 09/10/2019   Past Medical History:  Past Medical History:  Diagnosis Date   Bipolar disorder    Cancer    COPD (chronic obstructive pulmonary disease)    Coronary artery disease    Diverticulitis    Hypertension    Myocardial infarction    Seizure    Stroke    Past Surgical History:  Past Surgical History:  Procedure Laterality Date   ABDOMINAL SURGERY     BRAIN SURGERY     CHOLECYSTECTOMY     LEFT HEART CATH AND CORONARY ANGIOGRAPHY N/A 11/27/2020   Procedure: LEFT HEART CATH AND CORONARY ANGIOGRAPHY;  Surgeon: Orpah Cobb, MD;  Location: [facility];  Service: Cardiovascular;  Laterality: N/A;   HPI:  Patient is a 57 y.o. female with PMH: bipolar disorder, COPD not O2 dependent, CAD, HTN, CAD s/p stents, h/o CVA, h/o seizure disorder, CKD stage IIIb, tobacco use, gout, non-obstructive hypertrophic cardiomyopathy. She was recently admitted and discharged 06/07/22 for treatment of ACS, AKI, echo noting EF of 35-40%. She presented back to ED on 07/22/22 with c/o blurry vision x3 days with associated dizziness. In ED, patient afebrile, UA negative, MRI brain showed small focus of acute ischemia dorsal left pons.   Assessment / Plan / Recommendation Clinical Impression  Patient currently presenting with a cognitive impairment as per this evaluation,  however her baseline is not known. 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 She told SLP she was sleepy but was willing to participate in assessment. She received a score of 11 out of possible 30 on SLUMS which is significantly below normal range (scores of 27-30). Patient was oriented to place and situation but stated date as April 9th, stated year as 2022. During testing, she did not appear to work to the best of her ability as she would quickly say "I dont know" and required frequent verbal prompts to make further attempts to answer questions. SLP is recommending further diagnostic treatment of patient while she is admitted.    SLP Assessment  SLP Recommendation/Assessment: Patient needs continued Speech Lanaguage Pathology Services SLP Visit Diagnosis: Cognitive communication deficit (R41.841)    Recommendations for follow up therapy are one component of a multi-disciplinary discharge planning process, led by the attending physician.  Recommendations may be updated based on patient status, additional functional criteria and insurance authorization.    Follow Up Recommendations  Other (comment) (TBD)    Assistance Recommended at Discharge  Intermittent Supervision/Assistance  Functional Status Assessment Patient has had a recent decline in their functional status and demonstrates the ability to make significant improvements in function in a reasonable and predictable amount of time.  Frequency and Duration min 1 x/week  1 week      SLP Evaluation Cognition  Overall Cognitive Status: No family/caregiver present to determine baseline cognitive functioning Arousal/Alertness: Awake/alert Orientation Level: Oriented to person;Oriented to place;Oriented to situation;Disoriented  to time Year: 2022 Month: April Day of Week: Correct Attention: Selective Sustained Attention: Impaired Sustained Attention Impairment: Verbal complex Selective Attention:  Impaired Selective Attention Impairment: Verbal basic;Verbal complex Memory: Impaired Memory Impairment: Storage deficit;Retrieval deficit Awareness: Impaired Awareness Impairment: Anticipatory impairment Problem Solving: Impaired Problem Solving Impairment: Verbal complex Safety/Judgment: Appears intact       Comprehension  Auditory Comprehension Overall Auditory Comprehension: Appears within functional limits for tasks assessed    Expression Expression Primary Mode of Expression: Verbal Verbal Expression Overall Verbal Expression: Appears within functional limits for tasks assessed   Oral / Motor  Oral Motor/Sensory Function Overall Oral Motor/Sensory Function: Within functional limits Motor Speech Overall Motor Speech: Appears within functional limits for tasks assessed Respiration: Within functional limits Resonance: Within functional limits Articulation: Within functional limitis Intelligibility: Intelligible Motor Planning: Witnin functional limits            Angela Nevin, MA, CCC-SLP Speech Therapy

## 2022-07-23 NOTE — ED Notes (Signed)
[facility] has been notified to transport the patient to [facility].

## 2022-07-23 NOTE — Evaluation (Signed)
Occupational Therapy Evaluation Patient Details Name: Colleen Curtis MRN: [id number] DOB: Jul 06, 1965 Today's Date: 07/23/2022   History of Present Illness Pt is a 57 y.o. F who presents 07/22/2022 with double vision and blurred vision that started several days ago. MRI showing small punctuate area of acute infarction in the left pons. Significant PMH: COPD, CAD, prior stroke, seizure disorder, noncompliant to her medications.   Clinical Impression   Patient admitted for above and limited by problem list below.  She reports PTA she was independent, working and driving.  She completes ADLs with supervision, min guard fading to supervision for functional mobility in room.  Visually, pt with some R upper quadrant deficits and reported blurry vision, diplopia on L side.  Provided partial occlusion glasses (taping non dominant L nasal portion of glasses), pt reports diplopia improved but unable to wear functionally due to feeling dizzy with occluded glasses. Standing at sink, functionally, she missed soap on R side and when questioned she reports "seeing 2" and "she thought it was there". Will continue to assess vision to optimize independence and safety.  Pt verbalizes understanding to not drive at this time, also recommend assist with med mgmt, fiances, and cooking. .Will continue to follow acutely and recommend follow up with outpatient neuro OT services at dc.       Recommendations for follow up therapy are one component of a multi-disciplinary discharge planning process, led by the attending physician.  Recommendations may be updated based on patient status, additional functional criteria and insurance authorization.   Assistance Recommended at Discharge Frequent or constant Supervision/Assistance  Patient can return home with the following A little help with walking and/or transfers;A little help with bathing/dressing/bathroom;Assistance with cooking/housework;Direct supervision/assist for  medications management;Direct supervision/assist for financial management;Assist for transportation;Help with stairs or ramp for entrance    Functional Status Assessment  Patient has had a recent decline in their functional status and demonstrates the ability to make significant improvements in function in a reasonable and predictable amount of time.  Equipment Recommendations  None recommended by OT    Recommendations for Other Services       Precautions / Restrictions Precautions Precautions: Fall Restrictions Weight Bearing Restrictions: No      Mobility Bed Mobility Overal bed mobility: Modified Independent                  Transfers Overall transfer level: Needs assistance Equipment used: None Transfers: Sit to/from Stand Sit to Stand: Supervision                  Balance Overall balance assessment: Needs assistance Sitting-balance support: No upper extremity supported, Feet supported Sitting balance-Leahy Scale: Fair     Standing balance support: No upper extremity supported, During functional activity, Single extremity supported Standing balance-Leahy Scale: Fair                             ADL either performed or assessed with clinical judgement   ADL Overall ADL's : Needs assistance/impaired     Grooming: Supervision/safety;Standing Grooming Details (indicate cue type and reason): pt reaching past soap to wash hands, cueing to correctly locate         Upper Body Dressing : Set up;Sitting   Lower Body Dressing: Supervision/safety;Sit to/from stand   Toilet Transfer: Supervision/safety;Ambulation Toilet Transfer Details (indicate cue type and reason): initally reaching for hand support due to dizziness (from glasses) but removed and completed without assist  Functional mobility during ADLs: Supervision/safety       Vision Baseline Vision/History: 1 Wears glasses (driving- distance) Ability to See in Adequate  Light: 1 Impaired Patient Visual Report: Diplopia;Blurring of vision (on the L) Vision Assessment?: Yes Eye Alignment: Within Functional Limits Ocular Range of Motion: Within Functional Limits Alignment/Gaze Preference: Within Defined Limits Tracking/Visual Pursuits: Able to track stimulus in all quads without difficulty Visual Fields:  (R upper quadrant deficit) Diplopia Assessment: Only with left gaze;Present in far gaze;Objects split side to side Depth Perception: Overshoots Additional Comments: With quadrant testing, pt consistently has to look towards R upper quadrant to locate # of fingers; when keeps eyes focused ahead, she reports unable to see well in that quadrant.  She reports diplopia to L at distance, functionally reaching past soap on R side at sink and reports she thought it was there. Trialed partial occlusion glasses, pt reports not seeing as well and feeling dizzy with them.  Encouraged her to wear them a few times tonight to get used to them.     Perception     Praxis      Pertinent Vitals/Pain Pain Assessment Pain Assessment: No/denies pain     Hand Dominance     Extremity/Trunk Assessment Upper Extremity Assessment Upper Extremity Assessment: Overall WFL for tasks assessed   Lower Extremity Assessment Lower Extremity Assessment: Defer to PT evaluation   Cervical / Trunk Assessment Cervical / Trunk Assessment: Normal   Communication Communication Communication: No difficulties   Cognition Arousal/Alertness: Awake/alert Behavior During Therapy: WFL for tasks assessed/performed Overall Cognitive Status: No family/caregiver present to determine baseline cognitive functioning Area of Impairment: Problem solving, Awareness, Memory                     Memory: Decreased short-term memory     Awareness: Emergent Problem Solving: Slow processing, Requires verbal cues General Comments: Patient with some difficulty recalling recommendations for partial  occlusion glasses, she does reports it is not safe to drive.  She reports feeling exhausted which may be affecting cognition.     General Comments  provided partial occlusion glasses and educated on use due to diplopia, encouarged pt to wear for ~30 minute bouts this evening 2-3x to improve tolerance to glasses.    Exercises     Shoulder Instructions      Home Living Family/patient expects to be discharged to:: Private residence Living Arrangements: Other relatives Available Help at Discharge: Family Type of Home: Apartment       Home Layout: Two level;1/2 bath on main level;Bed/bath upstairs Alternate Level Stairs-Number of Steps: flight   Bathroom Shower/Tub: Producer, television/film/video: Standard     Home Equipment: [employer] - single point      Lives With: Other (Comment);Family (reports she lives with brother and a cousin)    Prior Functioning/Environment Prior Level of Function : Independent/Modified Independent;Driving;Working/employed               ADLs Comments: works as a Electrical engineer at [employer] Problem List: Decreased strength;Decreased activity tolerance;Impaired balance (sitting and/or standing);Obesity;Impaired vision/perception;Decreased cognition;Decreased safety awareness;Decreased knowledge of use of DME or AE;Decreased knowledge of precautions      OT Treatment/Interventions: Self-care/ADL training;Therapeutic exercise;DME and/or AE instruction;Therapeutic activities;Cognitive remediation/compensation;Balance training;Patient/family education;Visual/perceptual remediation/compensation    OT Goals(Current goals can be found in the care plan section) Acute Rehab OT Goals Patient Stated Goal: home OT Goal Formulation: With patient Time For Goal Achievement:  08/06/22 Potential to Achieve Goals: Good  OT Frequency: Min 2X/week    Co-evaluation              AM-PAC OT "6 Clicks" Daily Activity     Outcome Measure Help from another  person eating meals?: None Help from another person taking care of personal grooming?: A Little Help from another person toileting, which includes using toliet, bedpan, or urinal?: A Little Help from another person bathing (including washing, rinsing, drying)?: A Little Help from another person to put on and taking off regular upper body clothing?: A Little Help from another person to put on and taking off regular lower body clothing?: A Little 6 Click Score: 19   End of Session Nurse Communication: Mobility status  Activity Tolerance: Patient tolerated treatment well Patient left: in bed;with call bell/phone within reach;with bed alarm set  OT Visit Diagnosis: Other abnormalities of gait and mobility (R26.89);Muscle weakness (generalized) (M62.81);Low vision, both eyes (H54.2)                Time: 1610-9604 OT Time Calculation (min): 24 min Charges:  OT General Charges $OT Visit: 1 Visit OT Evaluation $OT Eval Moderate Complexity: 1 Mod OT Treatments $Self Care/Home Management : 8-22 mins  Barry Brunner, OT Acute Rehabilitation Services Office [phone]   Chancy Milroy 07/23/2022, 1:44 PM

## 2022-07-23 NOTE — Plan of Care (Signed)
Called by Dr. Estell Harpin about patient with uncontrolled hypertension, now with multiple days of diplopia symptoms, with MRI positive for brainstem stroke. Outside the window for thrombolysis or thrombectomy. Recommended admission to [facility] under hospitalist service for neurological consultation and stroke team follow-up. Will need CT angio head and neck, 2D echocardiogram, A1c, lipid panel. Aspirin and Plavix for now-duration to be determined based off of imaging studies and clinical course. Formal consultation once the patient arrives at [facility].   -- Milon Dikes, MD Neurologist [facility] Pager: [phone]

## 2022-07-24 ENCOUNTER — Inpatient Hospital Stay (HOSPITAL_COMMUNITY): Payer: 59

## 2022-07-24 ENCOUNTER — Other Ambulatory Visit (HOSPITAL_COMMUNITY): Payer: Self-pay

## 2022-07-24 DIAGNOSIS — I639 Cerebral infarction, unspecified: Secondary | ICD-10-CM | POA: Diagnosis not present

## 2022-07-24 DIAGNOSIS — F17200 Nicotine dependence, unspecified, uncomplicated: Secondary | ICD-10-CM | POA: Diagnosis not present

## 2022-07-24 LAB — BASIC METABOLIC PANEL
Anion gap: 9 (ref 5–15)
BUN: 36 mg/dL — ABNORMAL HIGH (ref 6–20)
CO2: 17 mmol/L — ABNORMAL LOW (ref 22–32)
Calcium: 8.5 mg/dL — ABNORMAL LOW (ref 8.9–10.3)
Chloride: 113 mmol/L — ABNORMAL HIGH (ref 98–111)
Creatinine, Ser: 4.02 mg/dL — ABNORMAL HIGH (ref 0.44–1.00)
GFR, Estimated: 12 mL/min — ABNORMAL LOW (ref >60)
Glucose, Bld: 114 mg/dL — ABNORMAL HIGH (ref 70–99)
Potassium: 3.6 mmol/L (ref 3.5–5.1)
Sodium: 139 mmol/L (ref 135–145)

## 2022-07-24 LAB — CBC
HCT: 37.3 % (ref 36.0–46.0)
Hemoglobin: 12.3 g/dL (ref 12.0–15.0)
MCH: 27 pg (ref 26.0–34.0)
MCHC: 33 g/dL (ref 30.0–36.0)
MCV: 82 fL (ref 80.0–100.0)
Platelets: 239 10*3/uL (ref 150–400)
RBC: 4.55 MIL/uL (ref 3.87–5.11)
RDW: 13.9 % (ref 11.5–15.5)
WBC: 9.4 10*3/uL (ref 4.0–10.5)
nRBC: 0 % (ref 0.0–0.2)

## 2022-07-24 MED ORDER — ASPIRIN 81 MG PO TBEC
81.0000 mg | DELAYED_RELEASE_TABLET | Freq: Every day | ORAL | 11 refills | Status: DC
  Filled 2022-07-24: qty 30, 30d supply, fill #0

## 2022-07-24 MED ORDER — CLOPIDOGREL BISULFATE 75 MG PO TABS
75.0000 mg | ORAL_TABLET | Freq: Every day | ORAL | 1 refills | Status: DC
  Filled 2022-07-24: qty 30, 30d supply, fill #0

## 2022-07-24 MED ORDER — ISOSORBIDE MONONITRATE ER 60 MG PO TB24
60.0000 mg | ORAL_TABLET | Freq: Every day | ORAL | 3 refills | Status: DC
  Filled 2022-07-24: qty 30, 30d supply, fill #0

## 2022-07-24 MED ORDER — AMLODIPINE BESYLATE 5 MG PO TABS
5.0000 mg | ORAL_TABLET | Freq: Every day | ORAL | 3 refills | Status: DC
  Filled 2022-07-24: qty 30, 30d supply, fill #0

## 2022-07-24 MED ORDER — CARVEDILOL 3.125 MG PO TABS
3.1250 mg | ORAL_TABLET | Freq: Two times a day (BID) | ORAL | 2 refills | Status: DC
  Filled 2022-07-24: qty 60, 30d supply, fill #0

## 2022-07-24 MED ORDER — ATORVASTATIN CALCIUM 80 MG PO TABS
80.0000 mg | ORAL_TABLET | Freq: Every day | ORAL | 3 refills | Status: DC
  Filled 2022-07-24: qty 30, 30d supply, fill #0

## 2022-07-24 NOTE — Progress Notes (Signed)
Occupational Therapy Treatment Patient Details Name: Colleen Curtis MRN: [id number] DOB: 1965-09-10 Today's Date: 07/24/2022   History of present illness Pt is a 57 y.o. F who presents 07/22/2022 with double vision and blurred vision that started several days ago. MRI showing small punctuate area of acute infarction in the left pons. Significant PMH: COPD, CAD, prior stroke, seizure disorder, noncompliant to her medications.   OT comments  Patient supine in bed and agreeable to OT session.  Patient demonstrating ability to complete transfers, functional mobility and ADLs with supervision.  Majority of session focused on med mgmt assessment using pill box test.  Pt initially requires cueing to fill out entire box (starting with 1 day only), but then able to self correct and complete with 100% accuracy. Pt reports vision during assessment was [facility], but continues to reports diplopia (although improved) with L gaze at distance. Provided handout for partial occulusion glasses, and continue to recommend use with L gaze distance a few times a day for 30 min-1 hour. Pt agreeable to recommendations of supervision for IADLs (meds, meals, fiances) and not driving until cleared by MD. Based on performance today, continue to recommend outpatient OT.  Will follow.    Recommendations for follow up therapy are one component of a multi-disciplinary discharge planning process, led by the attending physician.  Recommendations may be updated based on patient status, additional functional criteria and insurance authorization.    Assistance Recommended at Discharge Frequent or constant Supervision/Assistance  Patient can return home with the following  A little help with walking and/or transfers;A little help with bathing/dressing/bathroom;Assistance with cooking/housework;Direct supervision/assist for medications management;Direct supervision/assist for financial management;Assist for transportation;Help with stairs  or ramp for entrance   Equipment Recommendations  None recommended by OT    Recommendations for Other Services      Precautions / Restrictions Precautions Precautions: Fall Precaution Comments: low fall risk Restrictions Weight Bearing Restrictions: No       Mobility Bed Mobility Overal bed mobility: Modified Independent                  Transfers Overall transfer level: Modified independent                       Balance Overall balance assessment: Needs assistance Sitting-balance support: No upper extremity supported, Feet supported Sitting balance-Leahy Scale: Fair     Standing balance support: No upper extremity supported, During functional activity Standing balance-Leahy Scale: Fair                             ADL either performed or assessed with clinical judgement   ADL Overall ADL's : Needs assistance/impaired     Grooming: Supervision/safety;Standing;Wash/dry hands Grooming Details (indicate cue type and reason): locating items at sink without assist today             Lower Body Dressing: Supervision/safety;Sit to/from stand   Toilet Transfer: Supervision/safety;Ambulation           Functional mobility during ADLs: Supervision/safety      Extremity/Trunk Assessment              Vision   Additional Comments: pt able to read and place pills during med mgmt assessment without difficulty, endorses distance vision on L side still double but improving.  Continued education on intermittent use of glasses, and provided handout.   Perception     Praxis      Cognition  Arousal/Alertness: Awake/alert Behavior During Therapy: WFL for tasks assessed/performed Overall Cognitive Status: No family/caregiver present to determine baseline cognitive functioning Area of Impairment: Memory, Awareness, Problem solving, Attention                   Current Attention Level: Selective Memory: Decreased short-term  memory     Awareness: Emergent Problem Solving: Slow processing, Requires verbal cues General Comments: patient reports decreased STM at baseline.  She completed pill box test correctly, but initally only filled out 1 day- when cued, able to correct and complete with 100% accuracy.  Noted decreased attention, multitasking during session.  Pt endorses difficulty attending to tasks since CVA.   Functional cognition further assessed with The Pillbox Test: A Measure of Executive Functioning and Estimate of Medication Management. A straight pass/fail designation is determined by 3 or more errors of omission or misplacement on the task. The pt completed the test with less than 3 errors.       Exercises      Shoulder Instructions       General Comments provided handout for parital occlusion progression, pt reports improving vision on L side but not back to normal.  Patient educated on recommendations for supervision with med mgmt and fiances; discussed not driving until cleared by MD.  Also discussed importance to take medication at home, as pt reports not taking medications PTA.    Pertinent Vitals/ Pain       Pain Assessment Pain Assessment: No/denies pain  Home Living                                          Prior Functioning/Environment              Frequency  Min 2X/week        Progress Toward Goals  OT Goals(current goals can now be found in the care plan section)  Progress towards OT goals: Progressing toward goals  Acute Rehab OT Goals Patient Stated Goal: home OT Goal Formulation: With patient Time For Goal Achievement: 08/06/22 Potential to Achieve Goals: Good  Plan Discharge plan remains appropriate;Frequency remains appropriate    Co-evaluation                 AM-PAC OT "6 Clicks" Daily Activity     Outcome Measure   Help from another person eating meals?: None Help from another person taking care of personal grooming?: A  Little Help from another person toileting, which includes using toliet, bedpan, or urinal?: A Little Help from another person bathing (including washing, rinsing, drying)?: A Little Help from another person to put on and taking off regular upper body clothing?: A Little Help from another person to put on and taking off regular lower body clothing?: A Little 6 Click Score: 19    End of Session    OT Visit Diagnosis: Other abnormalities of gait and mobility (R26.89);Muscle weakness (generalized) (M62.81);Low vision, both eyes (H54.2)   Activity Tolerance Patient tolerated treatment well   Patient Left in bed;with call bell/phone within reach;with bed alarm set   Nurse Communication Mobility status        Time: 1610-9604 OT Time Calculation (min): 35 min  Charges: OT General Charges $OT Visit: 1 Visit OT Treatments $Self Care/Home Management : 8-22 mins $Cognitive Funtion inital: Initial 15 mins  Barry Brunner, OT Acute Rehabilitation Services Office [phone]   [place]  S Simona Rocque 07/24/2022, 1:15 PM

## 2022-07-24 NOTE — Progress Notes (Addendum)
Physical Therapy Treatment Patient Details Name: Colleen Curtis MRN: [id number] DOB: 04/07/1966 Today's Date: 07/24/2022   History of Present Illness Pt is a 57 y.o. F who presents 07/22/2022 with double vision and blurred vision that started several days ago. MRI showing small punctuate area of acute infarction in the left pons. Significant PMH: COPD, CAD, prior stroke, seizure disorder, noncompliant to her medications.    PT Comments    Pt with improved gait speed and fluidity of gait today with use of Rollator. Requiring one seated rest break. Continues with decreased cardiopulmonary endurance, however, pt states this has been an ongoing issue. Will continue to follow acutely. No PT follow up recommended.    Recommendations for follow up therapy are one component of a multi-disciplinary discharge planning process, led by the attending physician.  Recommendations may be updated based on patient status, additional functional criteria and insurance authorization.  Follow Up Recommendations       Assistance Recommended at Discharge PRN  Patient can return home with the following Assistance with cooking/housework;Assist for transportation;Help with stairs or ramp for entrance   Equipment Recommendations  Rollator (4 wheels)    Recommendations for Other Services       Precautions / Restrictions Precautions Precautions: Fall Precaution Comments: low fall risk Restrictions Weight Bearing Restrictions: No     Mobility  Bed Mobility Overal bed mobility: Modified Independent                  Transfers Overall transfer level: Modified independent                      Ambulation/Gait Ambulation/Gait assistance: Supervision Gait Distance (Feet): 200 Feet (100", 100") Assistive device: Rollator (4 wheels) Gait Pattern/deviations: Step-through pattern, Decreased stride length       General Gait Details: Improved gait speed with use of Rollator, one seated  rest break   Stairs             Wheelchair Mobility    Modified Rankin (Stroke Patients Only)       Balance Overall balance assessment: Needs assistance Sitting-balance support: No upper extremity supported, Feet supported Sitting balance-Leahy Scale: Good     Standing balance support: No upper extremity supported, During functional activity Standing balance-Leahy Scale: Fair                              Cognition Arousal/Alertness: Awake/alert Behavior During Therapy: WFL for tasks assessed/performed Overall Cognitive Status: No family/caregiver present to determine baseline cognitive functioning Area of Impairment: Memory, Awareness, Problem solving, Attention                   Current Attention Level: Selective Memory: Decreased short-term memory     Awareness: Emergent Problem Solving: Slow processing, Requires verbal cues          Exercises      General Comments General comments (skin integrity, edema, etc.): provided handout for parital occlusion progression, pt reports improving vision on L side but not back to normal.  Patient educated on recommendations for supervision with med mgmt and fiances; discussed not driving until cleared by MD.  Also discussed importance to take medication at home, as pt reports not taking medications PTA.      Pertinent Vitals/Pain Pain Assessment Pain Assessment: No/denies pain    Home Living  Prior Function            PT Goals (current goals can now be found in the care plan section) Acute Rehab PT Goals Potential to Achieve Goals: Good Progress towards PT goals: Progressing toward goals    Frequency    Min 3X/week      PT Plan Current plan remains appropriate    Co-evaluation              AM-PAC PT "6 Clicks" Mobility   Outcome Measure  Help needed turning from your back to your side while in a flat bed without using bedrails?:  None Help needed moving from lying on your back to sitting on the side of a flat bed without using bedrails?: None Help needed moving to and from a bed to a chair (including a wheelchair)?: None Help needed standing up from a chair using your arms (e.g., wheelchair or bedside chair)?: None Help needed to walk in hospital room?: A Little Help needed climbing 3-5 steps with a railing? : A Little 6 Click Score: 22    End of Session   Activity Tolerance: Patient tolerated treatment well Patient left: in bed;with call bell/phone within reach;with bed alarm set Nurse Communication: Mobility status PT Visit Diagnosis: Unsteadiness on feet (R26.81);Difficulty in walking, not elsewhere classified (R26.2)     Time: 1610-9604 PT Time Calculation (min) (ACUTE ONLY): 9 min  Charges:  $Therapeutic Activity: 8-22 mins                     Lillia Pauls, PT, DPT Acute Rehabilitation Services Office [phone]    Norval Morton 07/24/2022, 4:47 PM

## 2022-07-24 NOTE — Progress Notes (Signed)
Speech Language Pathology Treatment: Cognitive-Linquistic  Patient Details Name: Colleen Curtis MRN: [id number] DOB: [birth year] Today's Date: 07/24/2022 Time: 4098-1191 SLP Time Calculation (min) (ACUTE ONLY): 20 min  Assessment / Plan / Recommendation Clinical Impression  Patient seen by SLP for skilled treatment session focused on cognitive goals. Patient was very alert and appeared overall more interactive and responsive as compared to previous date. She demonstrated awareness to deficits, recall of precautions from medical and therapy staff and was fully oriented to time and situation. (Yesterday she told SLP it was "2022") Patient told SLP that she was only taking her blood pressure medication at home but all other prescribed medications she was not taking. She is aware and reports she is willing to take recommended medications upon current discharge and that her cousin will help her remember because he apparently gets after her about it. Neurology MD entered room and when he mentioned recommendation for her to quit smoking she said she couldn't do that and said, "that's down the road from now". She reports that she smokes one pack of cigarettes every 2-3 days but also smokes a Black and Mild cigar once a day. She also told MD that nicotine patches have not worked for her in the past. Overall, patient appears much improved as compared to previous date and although she might be at her cognitive baseline, SLP recommending ongoing skilled intervention at next venue of care to ensure she is capable of performing complex tasks such as medication management.     HPI HPI: Patient is a 57 y.o. female with PMH: bipolar disorder, COPD not O2 dependent, CAD, HTN, CAD s/p stents, h/o CVA, h/o seizure disorder, CKD stage IIIb, tobacco use, gout, non-obstructive hypertrophic cardiomyopathy. She was recently admitted and discharged 06/07/22 for treatment of ACS, AKI, echo noting EF of 35-40%. She presented  back to ED on 07/22/22 with c/o blurry vision x3 days with associated dizziness. In ED, patient afebrile, UA negative, MRI brain showed small focus of acute ischemia dorsal left pons.      SLP Plan  Discharge SLP treatment due to (comment);All goals met      Recommendations for follow up therapy are one component of a multi-disciplinary discharge planning process, led by the attending physician.  Recommendations may be updated based on patient status, additional functional criteria and insurance authorization.    Recommendations                      Intermittent Supervision/Assistance Cognitive communication deficit (R41.841)     Discharge SLP treatment due to (comment);All goals met    Angela Nevin, MA, CCC-SLP Speech Therapy

## 2022-07-24 NOTE — Progress Notes (Signed)
Discharge education complete. Belongings given to client. Pt home via taxi with voucher.

## 2022-07-24 NOTE — Discharge Summary (Signed)
Physician Discharge Summary   Patient: Colleen Curtis MRN: [id number] DOB: 05-19-1965  Admit date:     07/22/2022  Discharge date: 07/24/22  Discharge Physician: Alberteen Sam   PCP: [facility]    Recommendations at discharge:  Follow up with new PCP for stroke  New PCP: Please refer for Nephrology follow up given worsening CKD  Follow up with [facility] for stroke in 4-6 weeks     Discharge Diagnoses: Principal Problem:   Acute CVA (cerebrovascular accident) Other hospital problems   Coronary artery disease   Chronic obstructive Pulmonary disease   Morbid obesity BMI 40.7   Chronic kidney disease stage IIIb recent baseline 3.0   Seizure disorder   Cerebrovascular disease   Smoking   Gout     Hospital Course: The patient is a 57 y.o. F with obesity, untreated HTN, coronary disease with recent NSTEMI managed medically, noncompliant with aspirin/Plavix who presented with few days blurry vision and double vision.  MRI brain showed dorsal pontine stroke.    Acute pontine stroke -Non-invasive angiography showed diffuse atherosclerotic disease -Echocardiogram showed no cardiogenic source of embolism -Carotid imaging unremarkable   -Lipids ordered: discharged on high dose atorvastatin, refills sent -Aspirin ordered at admission --> discharged on aspirin and Plavix (at least 3 months, longer per Cardiology due to coronary disease) -Atrial fibrillation: not present on monitoring -tPA not given because outside window -Dysphagia screen ordered in ER -PT eval ordered: recommended HHPT -Smoking cessation: Recommended     Uncontrolled hypertension BP meds restarted in hospital  Coronary artery disease and recent NSTEMI Admitted for NSTEMI 2 months ago, did not undergo cath due to renal function, treated medically, but nonadherent.  Chronic kidney disease stage IIIb Recent Cr appears to have  Morbid obesity BMI  40.7  Chronic obstructive pulmonary disease No active disease  Hypertrophic cardiomyopathy Due to untreated HTN.  Needs Cardiology follow up, which she has.         The [facility] Controlled Substances Registry was reviewed for this patient prior to discharge.  Consultants: Neurology Procedures performed:  CT head MRI brain Carotid US MRA head Echo   Disposition: Home Diet recommendation:  Discharge Diet Orders (From admission, onward)     Start     Ordered   07/24/22 0000  Diet - low sodium heart healthy        07/24/22 1658             DISCHARGE MEDICATION: Allergies as of 07/24/2022   No Known Allergies      Medication List     TAKE these medications    acetaminophen 325 MG tablet Commonly known as: TYLENOL Take 2 tablets (650 mg total) by mouth every 4 (four) hours as needed for headache or mild pain. What changed:  how much to take when to take this   albuterol 108 (90 Base) MCG/ACT inhaler Commonly known as: VENTOLIN HFA Inhale 1-2 puffs into the lungs daily as needed for wheezing or shortness of breath.   allopurinol 100 MG tablet Commonly known as: ZYLOPRIM Take 1 tablet (100 mg total) by mouth daily.   amLODipine 5 MG tablet Commonly known as: NORVASC Take 1 tablet (5 mg total) by mouth daily.   aspirin EC 81 MG tablet Take 1 tablet (81 mg total) by mouth daily. Swallow whole.   atorvastatin 80 MG tablet Commonly known as: LIPITOR Take 1 tablet (80 mg total) by mouth at bedtime.   carvedilol 3.125 MG  tablet Commonly known as: COREG Take 1 tablet (3.125 mg total) by mouth 2 (two) times daily with a meal.   clopidogrel 75 MG tablet Commonly known as: PLAVIX Take 1 tablet (75 mg total) by mouth daily.   colchicine 0.6 MG tablet Take 1 tablet (0.6 mg total) by mouth daily.   hydrALAZINE 25 MG tablet Commonly known as: APRESOLINE Take 1 tablet (25 mg total) by mouth 2 (two) times daily.   isosorbide mononitrate 60 MG  24 hr tablet Commonly known as: IMDUR Take 1 tablet (60 mg total) by mouth daily.   nitroGLYCERIN 0.4 MG SL tablet Commonly known as: NITROSTAT Place 0.4 mg under the tongue every 5 (five) minutes as needed for chest pain.   sodium bicarbonate 650 MG tablet Take 1 tablet (650 mg total) by mouth 2 (two) times daily.   traZODone 100 MG tablet Commonly known as: DESYREL Take 100 mg by mouth at bedtime.               Durable Medical Equipment  (From admission, onward)           Start     Ordered   07/24/22 1458  For home use only DME 4 wheeled rolling walker with seat  Once       Question:  Patient needs a walker to treat with the following condition  Answer:  Stroke   07/24/22 1457            Follow-up Information     [facility] Follow up on 07/25/2022.   Why: your appointment is at 9:00 am. please arrive early and bring a picture ID, insurance card and current medications Contact information: [address], [place], [place] [address]  Phone: [phone]        [facility]. Schedule an appointment as soon as possible for a visit in 1 week(s).   Specialty: Rehabilitation Contact information: [address] [id number] mc [place] [address] [phone]        [facility]. Schedule an appointment as soon as possible for a visit in 1 month(s).   Specialty: Neurology Why: stroke clinic Contact information: [address] [place] [address] [phone]                Discharge Instructions     Ambulatory referral to Neurology   Complete by: As directed    Follow up with stroke clinic NP (Jessica Vanschaick or Darrol Angel, if both not available, consider Manson Allan, or Ahern) at [facility] in about 4 weeks. Thanks.   Ambulatory referral to Occupational Therapy   Complete by: As directed    Diet - low sodium heart healthy    Complete by: As directed    Discharge instructions   Complete by: As directed    You were admitted for a stroke You MUST MUST MUST take aspirin and clopidogrel/Plavix to prevent another stroke  You must take your cholesterol medicine and your blood pressure medicines  All refills have been sent to the pharmacy.  Follow up with your primary care doctor in 1 week  Follow up with the Neurology specialists in 4-6 weeks   Increase activity slowly   Complete by: As directed    No wound care   Complete by: As directed        Discharge Exam: Filed Weights   07/22/22 2019  Weight: 104.3 kg    General: Pt is alert,  awake, not in acute distress Cardiovascular: RRR, nl S1-S2, no murmurs appreciated.   No LE edema.   Respiratory: Normal respiratory rate and rhythm.  CTAB without rales or wheezes. Abdominal: Abdomen soft and non-tender.  No distension or HSM.   Neuro/Psych: Strength symmetric in upper and lower extremities.  Judgment and insight appear normal.   Condition at discharge: stable  The results of significant diagnostics from this hospitalization (including imaging, microbiology, ancillary and laboratory) are listed below for reference.   Imaging Studies: VAS US CAROTID (at [facility] and [facility] only)  Result Date: 07/24/2022 Carotid Arterial Duplex Study Patient Name:  KERRIANN KAMPHUIS Hardman  Date of Exam:   07/24/2022 Medical Rec #: [id number]               Accession #:    [id number] Date of Birth: [birth year]               Patient Gender: F Patient Age:   38 years Exam Location:  [facility] Procedure:      VAS US CAROTID Referring Phys: Skip Mayer --------------------------------------------------------------------------------  Indications:      CVA. Risk Factors:     Hypertension, hyperlipidemia, prior MI, coronary artery                   disease. Other Factors:    History of DVT/PE. Comparison Study: No prior studies Performing Technologist: Jean Rosenthal RDMS, RVT   Examination Guidelines: A complete evaluation includes B-mode imaging, spectral Doppler, color Doppler, and power Doppler as needed of all accessible portions of each vessel. Bilateral testing is considered an integral part of a complete examination. Limited examinations for reoccurring indications may be performed as noted.  Right Carotid Findings: +----------+--------+--------+--------+------------------+--------+           PSV cm/sEDV cm/sStenosisPlaque DescriptionComments +----------+--------+--------+--------+------------------+--------+ CCA Prox  82      18                                         +----------+--------+--------+--------+------------------+--------+ CCA Distal57      16                                         +----------+--------+--------+--------+------------------+--------+ ICA Prox  20      10      1-39%   heterogenous               +----------+--------+--------+--------+------------------+--------+ ICA Mid   39      20                                         +----------+--------+--------+--------+------------------+--------+ ICA Distal47      25                                         +----------+--------+--------+--------+------------------+--------+ ECA       54      11                                         +----------+--------+--------+--------+------------------+--------+ +----------+--------+-------+----------------+-------------------+  PSV cm/sEDV cmsDescribe        Arm Pressure (mmHG) +----------+--------+-------+----------------+-------------------+ Subclavian109            Multiphasic, WNL                    +----------+--------+-------+----------------+-------------------+ +---------+--------+--+--------+--+---------+ VertebralPSV cm/s43EDV cm/s18Antegrade +---------+--------+--+--------+--+---------+  Left Carotid Findings: +----------+--------+--------+--------+---------------------------+--------+            PSV cm/sEDV cm/sStenosisPlaque Description         Comments +----------+--------+--------+--------+---------------------------+--------+ CCA Prox  108     20                                                  +----------+--------+--------+--------+---------------------------+--------+ CCA Distal52      15                                                  +----------+--------+--------+--------+---------------------------+--------+ ICA Prox  41      11      1-39%   heterogenous and hypoechoic         +----------+--------+--------+--------+---------------------------+--------+ ICA Mid   44      22                                                  +----------+--------+--------+--------+---------------------------+--------+ ICA Distal52      30                                                  +----------+--------+--------+--------+---------------------------+--------+ ECA       53      14                                                  +----------+--------+--------+--------+---------------------------+--------+ +----------+--------+--------+----------------+-------------------+           PSV cm/sEDV cm/sDescribe        Arm Pressure (mmHG) +----------+--------+--------+----------------+-------------------+ [id number]             Multiphasic, WNL                    +----------+--------+--------+----------------+-------------------+ +---------+--------+--+--------+--+---------+ VertebralPSV cm/s45EDV cm/s19Antegrade +---------+--------+--+--------+--+---------+   Summary: Right Carotid: Velocities in the right ICA are consistent with a 1-39% stenosis. Left Carotid: Velocities in the left ICA are consistent with a 1-39% stenosis. Vertebrals:  Bilateral vertebral arteries demonstrate antegrade flow. Subclavians: Normal flow hemodynamics were seen in bilateral subclavian              arteries. *See table(s) above for measurements and observations.      Preliminary    ECHOCARDIOGRAM LIMITED  Result Date: 07/23/2022    ECHOCARDIOGRAM LIMITED REPORT   Patient Name:   KRISTINA MCNORTON Gleghorn Date of Exam: 07/23/2022 Medical Rec #:  [id number]              Height:  63.0 in Accession #:    [id number]             Weight:       230.0 lb Date of Birth:  1965-08-25              BSA:          2.052 m Patient Age:    57 years               BP:           127/74 mmHg Patient Gender: F                      HR:           65 bpm. Exam Location:  Inpatient Procedure: Limited Echo, Limited Color Doppler and Cardiac Doppler Indications:    Stroke I63.9  History:        Patient has prior history of Echocardiogram examinations, most                 recent 06/02/2022. Previous Myocardial Infarction and CAD, Stroke                 and Seizures; Risk Factors:Current Smoker, Hypertension and                 Dyslipidemia.  Sonographer:    Aron Baba Referring Phys: [id number] Marvel Plan  Sonographer Comments: Patient is obese. Image acquisition challenging due to respiratory motion and Image acquisition challenging due to COPD. IMPRESSIONS  1. Maximal septal thickness 2.3 cm. No resting LVOT gradient. No LV thrombus noted. No LV apical aneurysm noted. Left ventricular ejection fraction, by estimation, is 40%. The left ventricle has mildly decreased function. The left ventricle demonstrates  regional wall motion abnormalities (see scoring diagram/findings for description). There is severe concentric left ventricular hypertrophy.  2. Right ventricular systolic function is normal. The right ventricular size is normal. Mildly increased right ventricular wall thickness.  3. Left atrial size was mildly dilated.  4. A small pericardial effusion is present. The pericardial effusion is circumferential.  5. The mitral valve is normal in structure. Mild mitral valve regurgitation.  6. The inferior vena cava is normal in size with greater than 50% respiratory variability, suggesting right atrial  pressure of 3 mmHg. Comparison(s): No significant change from prior study. Conclusion(s)/Recommendation(s): Consider outpatient CMR in the setting or notable hypertrophy. FINDINGS  Left Ventricle: Maximal septal thickness 2.3 cm. No resting LVOT gradient. No LV thrombus noted. No LV apical aneurysm noted. Left ventricular ejection fraction, by estimation, is 40%. The left ventricle has mildly decreased function. The left ventricle  demonstrates regional wall motion abnormalities. The left ventricular internal cavity size was normal in size. There is severe concentric left ventricular hypertrophy.  LV Wall Scoring: The entire inferior wall, basal anteroseptal segment, mid inferoseptal segment, and basal inferoseptal segment are hypokinetic. Right Ventricle: The right ventricular size is normal. Mildly increased right ventricular wall thickness. Right ventricular systolic function is normal. Left Atrium: Left atrial size was mildly dilated. Pericardium: A small pericardial effusion is present. The pericardial effusion is circumferential. Mitral Valve: The mitral valve is normal in structure. Mild mitral valve regurgitation. Venous: The inferior vena cava is normal in size with greater than 50% respiratory variability, suggesting right atrial pressure of 3 mmHg. Additional Comments: Spectral Doppler performed. Color Doppler performed.  LEFT VENTRICLE PLAX 2D LVIDd:         5.20 cm LVIDs:  4.20 cm LV PW:         1.20 cm LV IVS:        1.50 cm  LV Volumes (MOD) LV vol d, MOD A2C: 107.0 ml LV vol d, MOD A4C: 98.5 ml LV vol s, MOD A2C: 59.4 ml LV vol s, MOD A4C: 39.2 ml LV SV MOD A2C:     47.6 ml LV SV MOD A4C:     98.5 ml LV SV MOD BP:      48.9 ml RIGHT VENTRICLE TAPSE (M-mode): 1.7 cm LEFT ATRIUM         Index LA diam:    4.60 cm 2.24 cm/m  MITRAL VALVE MV Area (PHT): 2.37 cm MV Decel Time: 320 msec MV E velocity: 40.40 cm/s MV A velocity: 76.20 cm/s MV E/A ratio:  0.53 Riley Lam MD Electronically  signed by Riley Lam MD Signature Date/Time: 07/23/2022/1:30:10 PM    Final    MR ANGIO HEAD WO CONTRAST  Result Date: 07/23/2022 CLINICAL DATA:  Double vision and blurry vision, acute infarct on MRI EXAM: MRA HEAD WITHOUT CONTRAST TECHNIQUE: Angiographic images of the Circle of Willis were acquired using MRA technique without intravenous contrast. COMPARISON:  No prior MRA available, correlation is made with MRI head 07/22/2022 FINDINGS: Evaluation is somewhat limited by motion artifact. Anterior circulation: Both internal carotid arteries are patent to the termini, with moderate stenosis in the right cavernous segment and mild stenosis in the left cavernous segment is right supraclinoid segment. A1 segments patent, with irregularity and likely mild to moderate stenosis in the right A1. Mild stenosis in the right A2 (series 9, image 102), with multifocal moderate to severe stenosis in the more distal right ACA (series 9, images 129, 137, and 144, for example. No significant stenosis in the left ACA. Mild stenosis in the proximal right M1 (series 9, image 103). Paste fat no significant stenosis in the left M1. Severe stenosis in several distal left MCA branches (series 9, image 132, 148). Posterior circulation: Vertebral arteries patent to the vertebrobasilar junction without stenosis. The right PICA is patent proximally, with possible moderate stenosis (series 9, image 41-42). The left PICA is not visualized. Basilar patent to its distal aspect, with moderate stenosis in the mid basilar artery (series 9, image 74 and series 1015, image 7). Superior cerebellar arteries patent bilaterally, although there is likely severe stenosis the origin of the right SCA (series 9, image 100). Patent right P1 segment. The right PCA is patent to its distal aspects, with moderate stenosis in the lateral right P2 segment (series 9, images 10 5-107 severe stenosis at the origin of the left P1 (series 9, image 23), with  apparent cut off of the proximal left P2 (series 9, image 101 and series 1039, image 11), with possible reconstitution distally (series 9, image 112). PCAs perfused to their distal aspects without stenosis. The bilateral posterior communicating arteries are not visualized. Anatomic variants: None significant IMPRESSION: 1. Evaluation is somewhat limited by motion artifact. Within this limitation, there is apparent occlusion of the left PCA at the P2 segment, with possible distal reconstitution. This could be further evaluated with a CTA of the head if clinically indicated. 2. Moderate stenosis in the right cavernous ICA and mild stenosis in the left cavernous ICA and right supraclinoid ICA. 3. Multifocal moderate to severe stenosis in the anterior and posterior circulation, as described, with severe stenosis in several distal left MCA branches, severe stenosis at the origin of the left P1 segment,  severe stenosis at the origin of the right SCA, and moderate stenosis in the mid basilar artery. These results will be called to the ordering clinician or representative by the Radiologist Assistant, and communication documented in the PACS or [employer]. Electronically Signed   By: Wiliam Ke M.D.   On: 07/23/2022 13:09   MR BRAIN WO CONTRAST  Result Date: 07/22/2022 CLINICAL DATA:  [place] EXAM: MRI HEAD AND ORBITS WITHOUT CONTRAST TECHNIQUE: Multiplanar, multi-echo pulse sequences of the brain and surrounding structures were acquired without intravenous contrast. Multiplanar, multi-echo pulse sequences of the orbits and surrounding structures were acquired including fat saturation techniques, without intravenous contrast administration. COMPARISON:  None Available. FINDINGS: MRI HEAD FINDINGS Brain: Small focus of acute ischemia at the dorsal left pons. No other acute lesion. There is confluent hyperintense T2-weighted signal within the periventricular and deep white matter. Mild generalized volume loss.  No acute hemorrhage. Vascular: Normal flow voids. Skull and upper cervical spine: Normal marrow signal. Other: None. MRI ORBITS FINDINGS Orbits: No orbital mass or evidence of inflammation. Normal appearance of the globes, optic nerve-sheath complexes, extraocular muscles, orbital fat and lacrimal glands. Visualized sinuses: Clear. Soft tissues: Normal. IMPRESSION: 1. Small focus of acute ischemia at the dorsal left pons, at the paramedian pontine reticular formation, which may cause internuclear ophthalmoplegia. 2. Normal MRI of the orbits. 3. Findings of chronic ischemic microangiopathy. Electronically Signed   By: Deatra Robinson M.D.   On: 07/22/2022 22:35   MR ORBITS WO CONTRAST  Result Date: 07/22/2022 CLINICAL DATA:  [place] EXAM: MRI HEAD AND ORBITS WITHOUT CONTRAST TECHNIQUE: Multiplanar, multi-echo pulse sequences of the brain and surrounding structures were acquired without intravenous contrast. Multiplanar, multi-echo pulse sequences of the orbits and surrounding structures were acquired including fat saturation techniques, without intravenous contrast administration. COMPARISON:  None Available. FINDINGS: MRI HEAD FINDINGS Brain: Small focus of acute ischemia at the dorsal left pons. No other acute lesion. There is confluent hyperintense T2-weighted signal within the periventricular and deep white matter. Mild generalized volume loss. No acute hemorrhage. Vascular: Normal flow voids. Skull and upper cervical spine: Normal marrow signal. Other: None. MRI ORBITS FINDINGS Orbits: No orbital mass or evidence of inflammation. Normal appearance of the globes, optic nerve-sheath complexes, extraocular muscles, orbital fat and lacrimal glands. Visualized sinuses: Clear. Soft tissues: Normal. IMPRESSION: 1. Small focus of acute ischemia at the dorsal left pons, at the paramedian pontine reticular formation, which may cause internuclear ophthalmoplegia. 2. Normal MRI of the orbits. 3. Findings of chronic  ischemic microangiopathy. Electronically Signed   By: Deatra Robinson M.D.   On: 07/22/2022 22:35    Microbiology: Results for orders placed or performed during the hospital encounter of 11/25/20  Resp Panel by RT-PCR (Flu A&B, Covid) Nasopharyngeal Swab     Status: None   Collection Time: 11/25/20  7:38 AM   Specimen: Nasopharyngeal Swab; Nasopharyngeal(NP) swabs in vial transport medium  Result Value Ref Range Status   SARS Coronavirus 2 by RT PCR NEGATIVE NEGATIVE Final    Comment: (NOTE) SARS-CoV-2 target nucleic acids are NOT DETECTED.  The SARS-CoV-2 RNA is generally detectable in upper respiratory specimens during the acute phase of infection. The lowest concentration of SARS-CoV-2 viral copies this assay can detect is 138 copies/mL. A negative result does not preclude SARS-Cov-2 infection and should not be used as the sole basis for treatment or other patient management decisions. A negative result may occur with  improper specimen collection/handling, submission of specimen other than nasopharyngeal swab, presence  of viral mutation(s) within the areas targeted by this assay, and inadequate number of viral copies(<138 copies/mL). A negative result must be combined with clinical observations, patient history, and epidemiological information. The expected result is Negative.  Fact Sheet for Patients:  BloggerCourse.com  Fact Sheet for Healthcare Providers:  SeriousBroker.it  This test is no t yet approved or cleared by the [place] FDA and  has been authorized for detection and/or diagnosis of SARS-CoV-2 by FDA under an Emergency Use Authorization (EUA). This EUA will remain  in effect (meaning this test can be used) for the duration of the COVID-19 declaration under Section 564(b)(1) of the Act, 21 U.S.C.section 360bbb-3(b)(1), unless the authorization is terminated  or revoked sooner.       Influenza A by PCR  NEGATIVE NEGATIVE Final   Influenza B by PCR NEGATIVE NEGATIVE Final    Comment: (NOTE) The Xpert Xpress SARS-CoV-2/FLU/RSV plus assay is intended as an aid in the diagnosis of influenza from Nasopharyngeal swab specimens and should not be used as a sole basis for treatment. Nasal washings and aspirates are unacceptable for Xpert Xpress SARS-CoV-2/FLU/RSV testing.  Fact Sheet for Patients: BloggerCourse.com  Fact Sheet for Healthcare Providers: SeriousBroker.it  This test is not yet approved or cleared by the [place] FDA and has been authorized for detection and/or diagnosis of SARS-CoV-2 by FDA under an Emergency Use Authorization (EUA). This EUA will remain in effect (meaning this test can be used) for the duration of the COVID-19 declaration under Section 564(b)(1) of the Act, 21 U.S.C. section 360bbb-3(b)(1), unless the authorization is terminated or revoked.  Performed at [facility], [address] [place], [place] [address]     Labs: CBC: Recent Labs  Lab 07/22/22 2040 07/23/22 0424 07/24/22 0411  WBC 10.3 10.1 9.4  HGB 13.5 12.8 12.3  HCT 41.0 39.5 37.3  MCV 82.7 83.7 82.0  PLT 261 245 239   Basic Metabolic Panel: Recent Labs  Lab 07/22/22 2040 07/23/22 0424 07/24/22 0411  NA 140  --  139  K 4.0  --  3.6  CL 114*  --  113*  CO2 15*  --  17*  GLUCOSE 93  --  114*  BUN 34*  --  36*  CREATININE 3.49* 3.53* 4.02*  CALCIUM 8.6*  --  8.5*   Liver Function Tests: No results for input(s): "AST", "ALT", "ALKPHOS", "BILITOT", "PROT", "ALBUMIN" in the last 168 hours. CBG: No results for input(s): "GLUCAP" in the last 168 hours.  Discharge time spent: approximately 35 minutes spent on discharge counseling, evaluation of patient on day of discharge, and coordination of discharge planning with nursing, social work, pharmacy and case management  Signed: Alberteen Sam, MD [facility] 07/24/2022

## 2022-07-24 NOTE — TOC Transition Note (Addendum)
Transition of Care [facility]) - CM/SW Discharge Note   Patient Details  Name: Colleen Curtis MRN: [id number] Date of Birth: [birth year]  [facility] CM/SW Contact:  Kermit Balo, RN Phone Number: 07/24/2022, 10:04 AM   Clinical Narrative:    Pt is discharging home with outpatient therapy through [facility]. Information on the AVS for pt to call and schedule an appointment. CM has sent referral to the oupt rehab. Pt states her family is not able to pick her up today. She can not afford a cab and with vision issues can not ride the bus. CM will provide a cab voucher for her transport home.  1500: Rollator ordered through [employer] and will be delivered to the room.  SDOH Interventions Today    Flowsheet Row Most Recent Value  SDOH Interventions   Food Insecurity Interventions Inpatient TOC, [id number] Referral         Final next level of care: OP Rehab Barriers to Discharge: No Barriers Identified   Patient Goals and CMS Choice   Choice offered to / list presented to : Patient  Discharge Placement                         Discharge Plan and Services Additional resources added to the After Visit Summary for     Discharge Planning Services: CM Consult                                 Social Determinants of Health (SDOH) Interventions SDOH Screenings   Food Insecurity: Food Insecurity Present (07/23/2022)  Housing: Low Risk  (07/23/2022)  Transportation Needs: No Transportation Needs (07/23/2022)  Utilities: Not At Risk (07/23/2022)  Tobacco Use: High Risk (07/22/2022)     Readmission Risk Interventions     No data to display

## 2022-07-24 NOTE — Progress Notes (Addendum)
STROKE TEAM PROGRESS NOTE   INTERVAL HISTORY Speech therapist is at bedside.  Patient lying bed, neuro stable, no acute event overnight.  Smoking cessation education provided, patient is willing to quit however she refused "cold [place]" and refused nicotine patch as it was not working in the past.  Vitals:   07/24/22 0343 07/24/22 0912 07/24/22 1122 07/24/22 1516  BP: 139/88 (!) 158/95 (!) 143/78 124/71  Pulse: 73 71 66 71  Resp: Temp: 98.4 F (36.9 C) 98.1 F (36.7 C) 98.2 F (36.8 C) 98.4 F (36.9 C)  TempSrc:  Oral Oral Oral  SpO2: 100% 100% 100% 100%  Weight:      Height:       CBC:  Recent Labs  Lab 07/23/22 0424 07/24/22 0411  WBC 10.1 9.4  HGB 12.8 12.3  HCT 39.5 37.3  MCV 83.7 82.0  PLT 245 239   Basic Metabolic Panel:  Recent Labs  Lab 07/22/22 2040 07/23/22 0424 07/24/22 0411  NA 140  --  139  K 4.0  --  3.6  CL 114*  --  113*  CO2 15*  --  17*  GLUCOSE 93  --  114*  BUN 34*  --  36*  CREATININE 3.49* 3.53* 4.02*  CALCIUM 8.6*  --  8.5*   Lipid Panel:  Recent Labs  Lab 07/23/22 0424  CHOL 273*  TRIG 149  HDL 41  CHOLHDL 6.7  VLDL 30  LDLCALC 161*   HgbA1c:  Recent Labs  Lab 07/23/22 0424  HGBA1C 5.4   Urine Drug Screen:  Recent Labs  Lab 07/23/22 1515  LABOPIA NONE DETECTED  COCAINSCRNUR NONE DETECTED  LABBENZ NONE DETECTED  AMPHETMU NONE DETECTED  THCU POSITIVE*  LABBARB NONE DETECTED    Alcohol Level No results for input(s): "ETH" in the last 168 hours.  IMAGING past 24 hours No results found.  PHYSICAL EXAM  Temp:  [98.1 F (36.7 C)-98.4 F (36.9 C)] 98.4 F (36.9 C) (04/17 1516) Pulse Rate:  [66-78] 71 (04/17 1516) Resp:  [12-20] 16 (04/17 1122) BP: (115-158)/(71-95) 124/71 (04/17 1516) SpO2:  [98 %-100 %] 100 % (04/17 1516)  General - Well nourished, well developed, in no apparent distress. Cardiovascular - Regular rhythm and rate.  Mental Status -  Level of arousal and orientation to time, place,  and person were intact.  She is edentulous Language including expression, naming, repetition, comprehension was assessed and found intact. Attention span and concentration were normal. Recent and remote memory were intact. Fund of Knowledge was assessed and was intact.  Cranial Nerves II - XII - II - Visual field intact OU.   III, IV, VI - Extraocular movements intact. Subjective reports of diplopia with leftward gaze V - Facial sensation intact bilaterally. VII - Facial movement intact bilaterally. VIII - Hearing & vestibular intact bilaterally. X - Palate elevates symmetrically. XI - Chin turning & shoulder shrug intact bilaterally. XII - Tongue protrusion intact.  Motor Strength - The patient's strength was normal in all extremities and pronator drift was absent.  Bulk was normal and fasciculations were absent.   Motor Tone - Muscle tone was assessed at the neck and appendages and was normal.  Sensory - Light touch, temperature/pinprick were assessed and were symmetrical.    Coordination - The patient had normal movements in the hands and feet with no ataxia or dysmetria.  Tremor was absent.  Gait and Station - deferred.  ASSESSMENT/PLAN [name] is a  57 y.o. female with history of stroke, seizure, hypertension, hyperlipidemia, CAD S/PE stent placement, nonobstructive hypertrophic cardiomyopathy COPD, CKD, bipolar, tobacco use (patient is noncompliant with medications nor does she see providers regularly), presents to the hospital for evaluation of double vision and blurry vision that started on Saturday prior to admission.  MRI revealed acute infarct in the pons  Stroke: Small acute left pons ischemic infarct, likely small vessel disease due to uncontrolled risk factors and noncompliance with meds MRI small acute ischemic infarct in left dorsal pons.  Chronic ischemic microangiopathy MRA diffuse athero with apparent occlusion of the left PCA at the P2 segment, with  possible distal reconstitution. severe stenosis in several distal left MCA branches, severe stenosis at the origin of the left P1 segment, severe stenosis at the origin of the right SCA. Moderate stenosis in the right cavernous ICA and mild stenosis in the left cavernous ICA and right supraclinoid ICA, and moderate stenosis in the mid basilar artery Carotid Doppler unremarkable 2D Echo EF 40% LDL 202 HgbA1c 5.4 UDS positive for THC VTE prophylaxis -heparin subcu No antithrombotics due to noncompliance (ASA and plavix listed as home meds) prior to admission, now on aspirin 81 mg and Plavix 75 mg daily.  Follow-up with cardiology for DAPT duration. Medication compliance education provided. Therapy recommendations: Outpatient OT Disposition: Pending  Hypertension Nonobstructive hypertrophic cardiomyopathy CAD s/p stents Home meds: Coreg 3.125 Mg twice daily, zotepine 5 mg, hydralazine 25 mg, Imdur 60 mg daily - noncompliance Stable on the high end Now home BP meds resumed TTE EF 40% (previous 05/2022 35-40%) Long-term BP goal normotensive  Hyperlipidemia Home meds: Atorvastatin 80 (noncompliance) LDL 202, goal < 70 Now on lipitor 80 Continue statin at discharge  Tobacco abuse Current smoker Smoking cessation counseling provided Pt is willing to quit, but refused to quit "cold [place]" and refused nicotine patch as it was not working in the past.  Other Stroke Risk Factors Obesity, Body mass index is 40.74 kg/m., BMI >/= 30 associated with increased stroke risk, recommend weight loss, diet and exercise as appropriate  Hx stroke/TIA, details not clear THC abuse, cessation education provided  Other Active Problems COPD Hx of seizures not on any AEDs CKD IV, Cre 3.45->3.53->4.02 Gout Bipolar  Hospital day # 2  Neurology will sign off. Please call with questions. Pt will follow up with stroke clinic NP at [facility] in about 4 weeks. Thanks for the consult.   Marvel Plan, MD  PhD Stroke Neurology 07/24/2022 3:42 PM      To contact Stroke Continuity provider, please refer to [email or web address]. After hours, contact General Neurology

## 2022-09-25 ENCOUNTER — Emergency Department (HOSPITAL_COMMUNITY)
Admission: EM | Admit: 2022-09-25 | Discharge: 2022-09-25 | Disposition: A | Discharge status: Home / Self Care | Payer: Self-pay | Attending: Emergency Medicine | Admitting: Emergency Medicine

## 2022-09-25 ENCOUNTER — Other Ambulatory Visit (HOSPITAL_COMMUNITY): Payer: Self-pay

## 2022-09-25 ENCOUNTER — Emergency Department (HOSPITAL_COMMUNITY): Payer: Self-pay

## 2022-09-25 ENCOUNTER — Encounter (HOSPITAL_COMMUNITY): Payer: Self-pay

## 2022-09-25 DIAGNOSIS — N189 Chronic kidney disease, unspecified: Secondary | ICD-10-CM | POA: Insufficient documentation

## 2022-09-25 DIAGNOSIS — J449 Chronic obstructive pulmonary disease, unspecified: Secondary | ICD-10-CM | POA: Insufficient documentation

## 2022-09-25 DIAGNOSIS — Z7982 Long term (current) use of aspirin: Secondary | ICD-10-CM | POA: Insufficient documentation

## 2022-09-25 DIAGNOSIS — D72829 Elevated white blood cell count, unspecified: Secondary | ICD-10-CM | POA: Insufficient documentation

## 2022-09-25 DIAGNOSIS — I10 Essential (primary) hypertension: Secondary | ICD-10-CM

## 2022-09-25 DIAGNOSIS — I129 Hypertensive chronic kidney disease with stage 1 through stage 4 chronic kidney disease, or unspecified chronic kidney disease: Secondary | ICD-10-CM | POA: Insufficient documentation

## 2022-09-25 DIAGNOSIS — Z8673 Personal history of transient ischemic attack (TIA), and cerebral infarction without residual deficits: Secondary | ICD-10-CM | POA: Insufficient documentation

## 2022-09-25 DIAGNOSIS — G459 Transient cerebral ischemic attack, unspecified: Secondary | ICD-10-CM

## 2022-09-25 DIAGNOSIS — Z79899 Other long term (current) drug therapy: Secondary | ICD-10-CM | POA: Insufficient documentation

## 2022-09-25 DIAGNOSIS — I2510 Atherosclerotic heart disease of native coronary artery without angina pectoris: Secondary | ICD-10-CM | POA: Insufficient documentation

## 2022-09-25 DIAGNOSIS — Z7902 Long term (current) use of antithrombotics/antiplatelets: Secondary | ICD-10-CM | POA: Insufficient documentation

## 2022-09-25 LAB — URINALYSIS, ROUTINE W REFLEX MICROSCOPIC
Bilirubin Urine: NEGATIVE
Glucose, UA: NEGATIVE mg/dL
Ketones, ur: NEGATIVE mg/dL
Nitrite: NEGATIVE
Protein, ur: 100 mg/dL — AB
Specific Gravity, Urine: 1.019 (ref 1.005–1.030)
pH: 5 (ref 5.0–8.0)

## 2022-09-25 LAB — CBC
HCT: 39.9 % (ref 36.0–46.0)
Hemoglobin: 12.8 g/dL (ref 12.0–15.0)
MCH: 26.2 pg (ref 26.0–34.0)
MCHC: 32.1 g/dL (ref 30.0–36.0)
MCV: 81.6 fL (ref 80.0–100.0)
Platelets: 264 10*3/uL (ref 150–400)
RBC: 4.89 MIL/uL (ref 3.87–5.11)
RDW: 14.6 % (ref 11.5–15.5)
WBC: 11.1 10*3/uL — ABNORMAL HIGH (ref 4.0–10.5)
nRBC: 0 % (ref 0.0–0.2)

## 2022-09-25 LAB — DIFFERENTIAL
Abs Immature Granulocytes: 0.07 10*3/uL (ref 0.00–0.07)
Basophils Absolute: 0.1 10*3/uL (ref 0.0–0.1)
Basophils Relative: 1 %
Eosinophils Absolute: 0.1 10*3/uL (ref 0.0–0.5)
Eosinophils Relative: 1 %
Immature Granulocytes: 1 %
Lymphocytes Relative: 14 %
Lymphs Abs: 1.6 10*3/uL (ref 0.7–4.0)
Monocytes Absolute: 0.7 10*3/uL (ref 0.1–1.0)
Monocytes Relative: 6 %
Neutro Abs: 8.6 10*3/uL — ABNORMAL HIGH (ref 1.7–7.7)
Neutrophils Relative %: 77 %

## 2022-09-25 LAB — I-STAT CHEM 8, ED
BUN: 33 mg/dL — ABNORMAL HIGH (ref 6–20)
Calcium, Ion: 1.01 mmol/L — ABNORMAL LOW (ref 1.15–1.40)
Chloride: 116 mmol/L — ABNORMAL HIGH (ref 98–111)
Creatinine, Ser: 3.3 mg/dL — ABNORMAL HIGH (ref 0.44–1.00)
Glucose, Bld: 101 mg/dL — ABNORMAL HIGH (ref 70–99)
HCT: 43 % (ref 36.0–46.0)
Hemoglobin: 14.6 g/dL (ref 12.0–15.0)
Potassium: 4.1 mmol/L (ref 3.5–5.1)
Sodium: 141 mmol/L (ref 135–145)
TCO2: 17 mmol/L — ABNORMAL LOW (ref 22–32)

## 2022-09-25 LAB — COMPREHENSIVE METABOLIC PANEL
ALT: 10 U/L (ref 0–44)
AST: 15 U/L (ref 15–41)
Albumin: 3.4 g/dL — ABNORMAL LOW (ref 3.5–5.0)
Alkaline Phosphatase: 85 U/L (ref 38–126)
Anion gap: 11 (ref 5–15)
BUN: 27 mg/dL — ABNORMAL HIGH (ref 6–20)
CO2: 18 mmol/L — ABNORMAL LOW (ref 22–32)
Calcium: 8.7 mg/dL — ABNORMAL LOW (ref 8.9–10.3)
Chloride: 108 mmol/L (ref 98–111)
Creatinine, Ser: 3.19 mg/dL — ABNORMAL HIGH (ref 0.44–1.00)
GFR, Estimated: 16 mL/min — ABNORMAL LOW (ref >60)
Glucose, Bld: 92 mg/dL (ref 70–99)
Potassium: 4.1 mmol/L (ref 3.5–5.1)
Sodium: 137 mmol/L (ref 135–145)
Total Bilirubin: 0.5 mg/dL (ref 0.3–1.2)
Total Protein: 6.7 g/dL (ref 6.5–8.1)

## 2022-09-25 LAB — APTT: aPTT: 34 seconds (ref 24–36)

## 2022-09-25 LAB — PROTIME-INR
INR: 1.1 (ref 0.8–1.2)
Prothrombin Time: 14.3 seconds (ref 11.4–15.2)

## 2022-09-25 LAB — CBG MONITORING, ED: Glucose-Capillary: 96 mg/dL (ref 70–99)

## 2022-09-25 LAB — ETHANOL: Alcohol, Ethyl (B): <10 mg/dL (ref <10)

## 2022-09-25 MED ORDER — HYDRALAZINE HCL 25 MG PO TABS
25.0000 mg | ORAL_TABLET | Freq: Two times a day (BID) | ORAL | 0 refills | Status: DC
  Filled 2022-09-25: qty 60, 30d supply, fill #0

## 2022-09-25 MED ORDER — AMLODIPINE BESYLATE 5 MG PO TABS: 5.0000 mg | ORAL_TABLET | Freq: Every day | ORAL | 3 refills | Status: DC

## 2022-09-25 MED ORDER — CARVEDILOL 3.125 MG PO TABS
3.1250 mg | ORAL_TABLET | Freq: Two times a day (BID) | ORAL | Status: DC
  Administered 2022-09-25 (×2): 3.125 mg via ORAL
  Filled 2022-09-25 (×2): qty 1

## 2022-09-25 MED ORDER — HYDRALAZINE HCL 25 MG PO TABS: 25.0000 mg | ORAL_TABLET | Freq: Two times a day (BID) | ORAL | 3 refills | Status: DC

## 2022-09-25 MED ORDER — CARVEDILOL 3.125 MG PO TABS: 3.1250 mg | ORAL_TABLET | Freq: Two times a day (BID) | ORAL | 2 refills | Status: DC

## 2022-09-25 MED ORDER — CLOPIDOGREL BISULFATE 75 MG PO TABS: 75.0000 mg | ORAL_TABLET | Freq: Every day | ORAL | 1 refills | Status: DC

## 2022-09-25 MED ORDER — HYDRALAZINE HCL 25 MG PO TABS
25.0000 mg | ORAL_TABLET | Freq: Two times a day (BID) | ORAL | Status: DC
  Administered 2022-09-25: 25 mg via ORAL
  Filled 2022-09-25: qty 1

## 2022-09-25 MED ORDER — ATORVASTATIN CALCIUM 80 MG PO TABS: 80.0000 mg | ORAL_TABLET | Freq: Every day | ORAL | 3 refills | Status: DC

## 2022-09-25 MED ORDER — AMLODIPINE BESYLATE 5 MG PO TABS
5.0000 mg | ORAL_TABLET | Freq: Every day | ORAL | Status: DC
  Administered 2022-09-25: 5 mg via ORAL
  Filled 2022-09-25: qty 1

## 2022-09-25 MED ORDER — CARVEDILOL 3.125 MG PO TABS
3.1250 mg | ORAL_TABLET | Freq: Two times a day (BID) | ORAL | 0 refills | Status: DC
  Filled 2022-09-25: qty 60, 30d supply, fill #0

## 2022-09-25 MED ORDER — SODIUM CHLORIDE 0.9% FLUSH
3.0000 mL | Freq: Once | INTRAVENOUS | Status: AC
  Administered 2022-09-25: 3 mL via INTRAVENOUS

## 2022-09-25 MED ORDER — ISOSORBIDE MONONITRATE ER 60 MG PO TB24
60.0000 mg | ORAL_TABLET | Freq: Every day | ORAL | 0 refills | Status: DC
  Filled 2022-09-25: qty 30, 30d supply, fill #0

## 2022-09-25 MED ORDER — AMLODIPINE BESYLATE 5 MG PO TABS
5.0000 mg | ORAL_TABLET | Freq: Every day | ORAL | 0 refills | Status: DC
  Filled 2022-09-25: qty 30, 30d supply, fill #0

## 2022-09-25 MED ORDER — ISOSORBIDE MONONITRATE ER 60 MG PO TB24: 60.0000 mg | ORAL_TABLET | Freq: Every day | ORAL | 3 refills | Status: DC

## 2022-09-25 MED ORDER — ISOSORBIDE MONONITRATE ER 30 MG PO TB24
60.0000 mg | ORAL_TABLET | Freq: Every day | ORAL | Status: DC
  Administered 2022-09-25: 60 mg via ORAL
  Filled 2022-09-25: qty 2

## 2022-09-25 MED ORDER — ASPIRIN 81 MG PO TBEC: 81.0000 mg | DELAYED_RELEASE_TABLET | Freq: Every day | ORAL | 11 refills | Status: AC

## 2022-09-25 NOTE — Discharge Instructions (Addendum)
Prescription refills were sent to your pharmacy.  Take these as prescribed.  You should establish care with a primary care doctor to manage your chronic medical conditions.  You should also follow-up with the eye doctor for a comprehensive eye exam.  Telephone number is below.  Return to the emergency department for any new or worsening symptoms of concern.

## 2022-09-25 NOTE — ED Notes (Signed)
Patient ambulated to the bathroom with minimal assistance.  

## 2022-09-25 NOTE — Code Documentation (Signed)
[name] is a 57 yr old female with a PMH of prior CVA, BPD, COPD, CAD, seizure, MI presenting to [facility] via [facility] on 09/25/2022. Pt is coming from work where she was last known well this morning at 0800, and is now complaining of Left sided weakness. She is not taking any blood thinners.     Pt met by stroke team at the bridge. Labs, CBG obtained, airway cleared by EDP. Pt to CT with team. NIHSS 5. Pt with left arm, bilat leg weakness, dysarthria, and RUQ vision loss. The following imaging was obtained: CT head. Per Dr. Iver Nestle, CT neg for acute abnormality. Pt to STAT MRI to determine thrombolytic eligibility. Per Dr. Iver Nestle, MRI negative for stroke. Code stroke cancelled. Bedside handoff with ED RN complete. Pt is ineligible for thrombolytics or thrombectomy due to neg for stroke.

## 2022-09-25 NOTE — ED Provider Notes (Signed)
[place] EMERGENCY DEPARTMENT AT [facility] Provider Note   CSN: [id number] Arrival date & time: 09/25/22  1005  An emergency department physician performed an initial assessment on this suspected stroke patient at 32.  History  No chief complaint on file.   Colleen Curtis is a 57 y.o. female.  HPI Patient presents for dizziness, weakness, and slurred speech.  Medical history includes CAD, DVT, PE, HLD, HTN, CKD, CVA, seizure, COPD, bipolar disorder.  Per chart review, it does not appear that she is on anticoagulation.  Patient works as a Electrical engineer.  She states that she went to work this morning in her normal state of health.  While at work, she did experience dizziness, generalized weakness, and slurred speech.  Patient reports that she has not been taking any of her home medications over the past 2 weeks after running out of her prescriptions.     Home Medications Prior to Admission medications   Medication Sig Start Date End Date Taking? Authorizing Provider  acetaminophen (TYLENOL) 325 MG tablet Take 2 tablets (650 mg total) by mouth every 4 (four) hours as needed for headache or mild pain. Patient taking differently: Take 3 tablets by mouth 2 (two) times daily as needed for headache or mild pain. 11/29/20   Orpah Cobb, MD  albuterol (VENTOLIN HFA) 108 (90 Base) MCG/ACT inhaler Inhale 1-2 puffs into the lungs daily as needed for wheezing or shortness of breath.    [provider]  allopurinol (ZYLOPRIM) 100 MG tablet Take 1 tablet (100 mg total) by mouth daily. Patient not taking: Reported on 10/31/2021 11/29/20   Orpah Cobb, MD  amLODipine (NORVASC) 5 MG tablet Take 1 tablet (5 mg total) by mouth daily. 09/25/22   Gloris Manchester, MD  aspirin EC 81 MG tablet Take 1 tablet (81 mg total) by mouth daily. Swallow whole. 09/25/22   Gloris Manchester, MD  atorvastatin (LIPITOR) 80 MG tablet Take 1 tablet (80 mg total) by mouth at bedtime. 09/25/22   Gloris Manchester,  MD  carvedilol (COREG) 3.125 MG tablet Take 1 tablet (3.125 mg total) by mouth 2 (two) times daily with a meal. 09/25/22   Gloris Manchester, MD  clopidogrel (PLAVIX) 75 MG tablet Take 1 tablet (75 mg total) by mouth daily. 09/25/22   Gloris Manchester, MD  colchicine 0.6 MG tablet Take 1 tablet (0.6 mg total) by mouth daily. Patient not taking: Reported on 07/22/2022 06/08/22   Orpah Cobb, MD  hydrALAZINE (APRESOLINE) 25 MG tablet Take 1 tablet (25 mg total) by mouth 2 (two) times daily. 09/25/22   Gloris Manchester, MD  isosorbide mononitrate (IMDUR) 60 MG 24 hr tablet Take 1 tablet (60 mg total) by mouth daily. 09/25/22   Gloris Manchester, MD  nitroGLYCERIN (NITROSTAT) 0.4 MG SL tablet Place 0.4 mg under the tongue every 5 (five) minutes as needed for chest pain. Patient not taking: Reported on 10/31/2021    [provider]  sodium bicarbonate 650 MG tablet Take 1 tablet (650 mg total) by mouth 2 (two) times daily. Patient not taking: Reported on 07/22/2022 06/07/22   Orpah Cobb, MD  traZODone (DESYREL) 100 MG tablet Take 100 mg by mouth at bedtime.    [provider]      Allergies    Patient has no known allergies.    Review of Systems   Review of Systems  Neurological:  Positive for dizziness, speech difficulty and weakness (Generalized).  All other systems reviewed and are negative.  Physical Exam Updated Vital Signs BP (!) 156/137   Pulse 73   Temp 98.2 F (36.8 C) (Oral)   Resp 17   Wt 103.6 kg   SpO2 100%   BMI 40.46 kg/m  Physical Exam Vitals and nursing note reviewed.  Constitutional:      General: She is not in acute distress.    Appearance: Normal appearance. She is well-developed. She is not ill-appearing, toxic-appearing or diaphoretic.  HENT:     Head: Normocephalic and atraumatic.     Right Ear: External ear normal.     Left Ear: External ear normal.     Nose: Nose normal.     Mouth/Throat:     Mouth: Mucous membranes are moist.  Eyes:     Extraocular  Movements: Extraocular movements intact.     Conjunctiva/sclera: Conjunctivae normal.  Cardiovascular:     Rate and Rhythm: Normal rate and regular rhythm.  Pulmonary:     Effort: Pulmonary effort is normal. No respiratory distress.  Abdominal:     General: There is no distension.     Palpations: Abdomen is soft.  Musculoskeletal:        General: No swelling. Normal range of motion.     Cervical back: Normal range of motion and neck supple.  Skin:    General: Skin is warm and dry.     Capillary Refill: Capillary refill takes less than 2 seconds.     Coloration: Skin is not jaundiced or pale.  Neurological:     General: No focal deficit present.     Mental Status: She is alert and oriented to person, place, and time.     Cranial Nerves: No cranial nerve deficit.     Sensory: No sensory deficit.     Motor: No weakness.     Coordination: Coordination normal.  Psychiatric:        Mood and Affect: Mood normal.        Behavior: Behavior normal.        Thought Content: Thought content normal.        Judgment: Judgment normal.     ED Results / Procedures / Treatments   Labs (all labs ordered are listed, but only abnormal results are displayed) Labs Reviewed  CBC - Abnormal; Notable for the following components:      Result Value   WBC 11.1 (*)    All other components within normal limits  DIFFERENTIAL - Abnormal; Notable for the following components:   Neutro Abs 8.6 (*)    All other components within normal limits  COMPREHENSIVE METABOLIC PANEL - Abnormal; Notable for the following components:   CO2 18 (*)    BUN 27 (*)    Creatinine, Ser 3.19 (*)    Calcium 8.7 (*)    Albumin 3.4 (*)    GFR, Estimated 16 (*)    All other components within normal limits  I-STAT CHEM 8, ED - Abnormal; Notable for the following components:   Chloride 116 (*)    BUN 33 (*)    Creatinine, Ser 3.30 (*)    Glucose, Bld 101 (*)    Calcium, Ion 1.01 (*)    TCO2 17 (*)    All other components  within normal limits  PROTIME-INR  APTT  ETHANOL  URINALYSIS, ROUTINE W REFLEX MICROSCOPIC  CBG MONITORING, ED    EKG EKG Interpretation  Date/Time:  Wednesday September 25 2022 11:15:33 EDT Ventricular Rate:  64 PR Interval:  200 QRS Duration: 107 QT  Interval:  470 QTC Calculation: 485 R Axis:   51 Text Interpretation: Sinus rhythm Left atrial enlargement Incomplete left bundle branch block Low voltage, precordial leads Confirmed by Gloris Manchester [phone]) on 09/25/2022 12:11:00 PM  Radiology MR BRAIN WO CONTRAST  Result Date: 09/25/2022 CLINICAL DATA:  Neuro deficit, acute, stroke suspected. EXAM: MRI HEAD WITHOUT CONTRAST MRA HEAD WITHOUT CONTRAST TECHNIQUE: Multiplanar, multi-echo pulse sequences of the brain and surrounding structures were acquired without intravenous contrast. Angiographic images of the Circle of Willis were acquired using MRA technique without intravenous contrast. COMPARISON:  Head CT 09/25/2022. MRI brain 07/22/2022. MRA head 07/23/2022. FINDINGS: MRI HEAD FINDINGS Brain: No acute infarct or hemorrhage. Unchanged severe chronic small-vessel disease with old lacunar infarcts in the right thalamus left midbrain and left caudate. No hydrocephalus or extra-axial collection. No mass or midline shift. No abnormal susceptibility. Vascular: Normal flow voids. Skull and upper cervical spine: Normal marrow signal. Sinuses/Orbits: Unremarkable. Other: None. 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 Mild atherosclerotic narrowing of the cavernous and clinoid segments of the right ICA. The proximal ACAs and MCAs are patent without stenosis or aneurysm. Distal branches are symmetric. Posterior circulation: Visualized portions of the distal vertebral arteries and basilar artery are patent without stenosis or aneurysm. 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 Unchanged moderate stenosis of the proximal  left P1 segment with diminished flow related enhancement in the left P2 branches, likely secondary to moderate-to-severe stenoses. Anatomic variants: None. IMPRESSION: 1. No acute intracranial process. 2. Unchanged severe chronic small-vessel disease with old lacunar infarcts in the right thalamus, left midbrain and left caudate. 3. Unchanged moderate stenosis of the proximal left P1 segment with diminished flow related enhancement in the left P2 branches, likely secondary to moderate-to-severe stenoses. 4. 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 5. 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 Electronically Signed   By: Orvan Falconer M.D.   On: 09/25/2022 11:25   MR ANGIO HEAD WO CONTRAST  Result Date: 09/25/2022 CLINICAL DATA:  Neuro deficit, acute, stroke suspected. EXAM: MRI HEAD WITHOUT CONTRAST MRA HEAD WITHOUT CONTRAST TECHNIQUE: Multiplanar, multi-echo pulse sequences of the brain and surrounding structures were acquired without intravenous contrast. Angiographic images of the Circle of Willis were acquired using MRA technique without intravenous contrast. COMPARISON:  Head CT 09/25/2022. MRI brain 07/22/2022. MRA head 07/23/2022. FINDINGS: MRI HEAD FINDINGS Brain: No acute infarct or hemorrhage. Unchanged severe chronic small-vessel disease with old lacunar infarcts in the right thalamus left midbrain and left caudate. No hydrocephalus or extra-axial collection. No mass or midline shift. No abnormal susceptibility. Vascular: Normal flow voids. Skull and upper cervical spine: Normal marrow signal. Sinuses/Orbits: Unremarkable. Other: None. 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 Mild atherosclerotic narrowing of the cavernous and clinoid segments of the right ICA. The proximal ACAs and MCAs are patent without stenosis or aneurysm. Distal branches are symmetric.  Posterior circulation: Visualized portions of the distal vertebral arteries and basilar artery are patent without stenosis or aneurysm. 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 Unchanged moderate stenosis of the proximal left P1 segment with diminished flow related enhancement in the left P2 branches, likely secondary to moderate-to-severe stenoses. Anatomic variants: None. IMPRESSION: 1. No acute intracranial process. 2. Unchanged severe chronic small-vessel disease with old lacunar infarcts in the right thalamus, left  midbrain and left caudate. 3. Unchanged moderate stenosis of the proximal left P1 segment with diminished flow related enhancement in the left P2 branches, likely secondary to moderate-to-severe stenoses. 4. 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 5. 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 Electronically Signed   By: Orvan Falconer M.D.   On: 09/25/2022 11:25   CT HEAD CODE STROKE WO CONTRAST  Result Date: 09/25/2022 CLINICAL DATA:  Code stroke.  Left-sided weakness. EXAM: CT HEAD WITHOUT CONTRAST TECHNIQUE: Contiguous axial images were obtained from the base of the skull through the vertex without intravenous contrast. RADIATION DOSE REDUCTION: This exam was performed according to the departmental dose-optimization program which includes automated exposure control, adjustment of the mA and/or kV according to patient size and/or use of iterative reconstruction technique. COMPARISON:  MR head 07/23/2022 FINDINGS: Brain: There is no evidence of acute intracranial hemorrhage, extra-axial fluid collection, or acute territorial infarct. Parenchymal volume is stable. The ventricles are stable in size. Confluent hypodensity in the supratentorial white matter likely reflects sequela of advanced chronic small-vessel ischemic change, stable. Small remote lacunar infarcts are seen in the bilateral basal ganglia and thalami, unchanged. The  pituitary and suprasellar region are normal. There is no mass lesion. There is no mass effect or midline shift. Vascular: There is calcification of the bilateral carotid siphons. Skull: Normal. Negative for fracture or focal lesion. Sinuses/Orbits: The paranasal sinuses are clear. The globes and orbits are unremarkable. Other: None. ASPECTS [facility] Stroke Program Early CT Score) - Ganglionic level infarction (caudate, lentiform nuclei, internal capsule, insula, M1-M3 cortex): Seven - Supraganglionic infarction (M4-M6 cortex): 3 Total score (0-10 with 10 being normal): 10 IMPRESSION: No acute intracranial pathology. Findings communicated to Dr Iver Nestle at 10:28 am. Electronically Signed   By: Lesia Hausen M.D.   On: 09/25/2022 10:28    Procedures Procedures    Medications Ordered in ED Medications  amLODipine (NORVASC) tablet 5 mg (5 mg Oral Given 09/25/22 1157)  carvedilol (COREG) tablet 3.125 mg (3.125 mg Oral Given 09/25/22 1157)  hydrALAZINE (APRESOLINE) tablet 25 mg (25 mg Oral Given 09/25/22 1157)  isosorbide mononitrate (IMDUR) 24 hr tablet 60 mg (60 mg Oral Given 09/25/22 1157)  sodium chloride flush (NS) 0.9 % injection 3 mL (3 mLs Intravenous Given 09/25/22 1159)    ED Course/ Medical Decision Making/ A&P                             Medical Decision Making Amount and/or Complexity of Data Reviewed Labs: ordered. Radiology: ordered.  Risk OTC drugs. Prescription drug management.   This patient presents to the ED for concern of strokelike symptoms, this involves an extensive number of treatment options, and is a complaint that carries with it a high risk of complications and morbidity.  The differential diagnosis includes CVA, TIA, hypertensive crisis, metabolic derangements, anxiety   Co morbidities that complicate the patient evaluation  CAD, DVT, PE, HLD, HTN, CKD, CVA, seizure, COPD, bipolar disorder   Additional history obtained:  Additional history obtained from  EMS External records from outside source obtained and reviewed including EMR   Lab Tests:  I Ordered, and personally interpreted labs.  The pertinent results include: Baseline CKD, baseline non-anion gap metabolic acidosis, mild leukocytosis, normal hemoglobin   Imaging Studies ordered:  I ordered imaging studies including CT head, MRI brain, MRA head I independently visualized and interpreted imaging which showed  no acute findings, unchanged area of stenoses, 2 mm left ICA aneurysm I agree with the radiologist interpretation   Cardiac Monitoring: / EKG:  The patient was maintained on a cardiac monitor.  I personally viewed and interpreted the cardiac monitored which showed an underlying rhythm of: Sinus rhythm   Consultations Obtained:  I requested consultation with the neurologist, Dr. Iver Nestle,  and discussed lab and imaging findings as well as pertinent plan - they recommend: Discharge with PCP and ophthalmology follow-up.   Problem List / ED Course / Critical interventions / Medication management  Patient presents for dizziness, weakness, and slurred speech.  She arrives as a code stroke.  Vital signs prior to arrival are notable for hypertension.  Patient arrives alert and oriented.  No focal deficits appreciated on initial exam.  Patient continues to feel like her speech is slurred.  She was taken directly to CT scanner.  CT scan did not show any acute findings.  She underwent emergent MRI and MRA which were also negative for acute findings.  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  It is unlikely that this is causing her symptoms.  Following imaging studies, patient had improved symptoms.  Blood pressure remains elevated.  Given absence of stroke, will treat for hypertensive etiology.  Refills for home blood pressure and DAPT medications were ordered.  Patient reports that she has not taken her home  medications in the past 2 weeks due to running out.  Patient had improvement in blood pressure and resolution of all symptoms.  She was able to eat and drink.  She does request discharge home at this time. I ordered medication including amlodipine, carvedilol, hydralazine, Imdur for hypertension Reevaluation of the patient after these medicines showed that the patient improved I have reviewed the patients home medicines and have made adjustments as needed   Social Determinants of Health:  Medication nonadherence        Final Clinical Impression(s) / ED Diagnoses Final diagnoses:  Hypertension, unspecified type    Rx / DC Orders ED Discharge Orders          Ordered    amLODipine (NORVASC) 5 MG tablet  Daily        09/25/22 1407    aspirin EC 81 MG tablet  Daily        09/25/22 1407    atorvastatin (LIPITOR) 80 MG tablet  Daily at bedtime        09/25/22 1407    carvedilol (COREG) 3.125 MG tablet  2 times daily with meals        09/25/22 1407    clopidogrel (PLAVIX) 75 MG tablet  Daily        09/25/22 1407    hydrALAZINE (APRESOLINE) 25 MG tablet  2 times daily        09/25/22 1407    isosorbide mononitrate (IMDUR) 60 MG 24 hr tablet  Daily        09/25/22 1407              Gloris Manchester, MD 09/25/22 1413

## 2022-09-25 NOTE — ED Notes (Signed)
Patient provided ginger ale by this tech

## 2022-09-25 NOTE — ED Triage Notes (Signed)
Pt BIB [facility] from work. Pt woke up and drove herself to work. At 8 AM she felt like her speech was slurred. Feeling weaker than normal for the past two days with a shuffling gait.   BP 174/120 HR 88 CBG 131 16 RR

## 2022-09-25 NOTE — Discharge Planning (Signed)
RNCM consulted regarding uninsured pt requiring discharge Rx.  RNCM suggests sending Rx to [facility] to fill and  deliver Rx to patient at bedside prior to discharge from hospital.

## 2022-09-25 NOTE — Consult Note (Signed)
Neurology Consultation  Reason for Consult: Code Stroke Referring Physician: Durwin Nora  CC:  History is obtained from: patient and past medical records  HPI: Colleen Curtis is a 57 y.o. female with a past medical history of bipolar disorder, cancer, COPD, CAD, diverticulitis, HTN, MI, seizure, and recent stroke presenting with slurred speech, dizziness, and weakness. She states that she started feeling weak on 6/17. On exam she does have a visual field deficit in her right upper quadrant but she states that she did not realize this until we tested her visual fields. She was taken for a stat MRI. Diffusion imaging negative for acute infarct. She was prescribed aspirin and plavix at discharge in April, however she states that she is not currently taking any medications.    LKW: 6/17 TNK given?: No  Premorbid modified Rankin scale (mRS): 0  ROS: Full ROS was performed and is negative except as noted in the HPI.  Past Medical History:  Diagnosis Date   Bipolar disorder (HCC)    Cancer (HCC)    COPD (chronic obstructive pulmonary disease) (HCC)    Coronary artery disease    Diverticulitis    Hypertension    Myocardial infarction (HCC)    Seizure (HCC)    Stroke (HCC)      Family History  Problem Relation Age of Onset   Heart disease Mother    Hypertension Mother    Heart disease Father    Diabetes Mellitus II Father    Arrhythmia Brother     Social History:   reports that she has been smoking cigars. She has never used smokeless tobacco. She reports current drug use. Drug: Marijuana. She reports that she does not drink alcohol.  Medications  Current Facility-Administered Medications:    sodium chloride flush (NS) 0.9 % injection 3 mL, 3 mL, Intravenous, Once, Gloris Manchester, MD  Current Outpatient Medications:    acetaminophen (TYLENOL) 325 MG tablet, Take 2 tablets (650 mg total) by mouth every 4 (four) hours as needed for headache or mild pain. (Patient taking  differently: Take 3 tablets by mouth 2 (two) times daily as needed for headache or mild pain.), Disp: , Rfl:    albuterol (VENTOLIN HFA) 108 (90 Base) MCG/ACT inhaler, Inhale 1-2 puffs into the lungs daily as needed for wheezing or shortness of breath., Disp: , Rfl:    allopurinol (ZYLOPRIM) 100 MG tablet, Take 1 tablet (100 mg total) by mouth daily. (Patient not taking: Reported on 10/31/2021), Disp: 30 tablet, Rfl: 1   amLODipine (NORVASC) 5 MG tablet, Take 1 tablet (5 mg total) by mouth daily., Disp: 30 tablet, Rfl: 3   aspirin EC 81 MG tablet, Take 1 tablet (81 mg total) by mouth daily. Swallow whole., Disp: 30 tablet, Rfl: 11   atorvastatin (LIPITOR) 80 MG tablet, Take 1 tablet (80 mg total) by mouth at bedtime., Disp: 30 tablet, Rfl: 3   carvedilol (COREG) 3.125 MG tablet, Take 1 tablet (3.125 mg total) by mouth 2 (two) times daily with a meal., Disp: 60 tablet, Rfl: 2   clopidogrel (PLAVIX) 75 MG tablet, Take 1 tablet (75 mg total) by mouth daily., Disp: 30 tablet, Rfl: 1   colchicine 0.6 MG tablet, Take 1 tablet (0.6 mg total) by mouth daily. (Patient not taking: Reported on 07/22/2022), Disp: 30 tablet, Rfl: 1   hydrALAZINE (APRESOLINE) 25 MG tablet, Take 1 tablet (25 mg total) by mouth 2 (two) times daily. (Patient not taking: Reported on 07/22/2022), Disp: 60 tablet, Rfl: 3  isosorbide mononitrate (IMDUR) 60 MG 24 hr tablet, Take 1 tablet (60 mg total) by mouth daily., Disp: 30 tablet, Rfl: 3   nitroGLYCERIN (NITROSTAT) 0.4 MG SL tablet, Place 0.4 mg under the tongue every 5 (five) minutes as needed for chest pain. (Patient not taking: Reported on 10/31/2021), Disp: , Rfl:    sodium bicarbonate 650 MG tablet, Take 1 tablet (650 mg total) by mouth 2 (two) times daily. (Patient not taking: Reported on 07/22/2022), Disp: 60 tablet, Rfl: 3   traZODone (DESYREL) 100 MG tablet, Take 100 mg by mouth at bedtime., Disp: , Rfl:   Exam: Current vital signs: Wt 103.6 kg   BMI 40.46 kg/m  Vital signs  in last 24 hours: Weight:  [103.6 kg] 103.6 kg (06/19 1000)  GENERAL: Awake, alert in NAD HEENT: - Normocephalic and atraumatic, dry mm, no LN++, no Thyromegally LUNGS - Clear to auscultation bilaterally with no wheezes CV - S1S2 RRR, no m/r/g, equal pulses bilaterally. ABDOMEN - Soft, nontender, nondistended with normoactive BS Ext: warm, well perfused, intact peripheral pulses, no edema  NEURO:  Mental Status: AA&Ox3  Language: speech is mildly dysarthric.  Naming, repetition, fluency, and comprehension intact. Cranial Nerves: PERRL, EOMI, right upper quadrant visual field deficit, no facial asymmetry, facial sensation intact, hearing intact, tongue/uvula/soft palate midline, normal sternocleidomastoid and trapezius muscle strength. No evidence of tongue atrophy or fibrillations Motor:  RUE 5/5 LUE 5/5 RLE 4/5 LLE 4/5 Tone: is normal and bulk is normal Sensation- Intact to light touch bilaterally Coordination: FTN intact bilaterally, no ataxia in BLE. Gait- deferred   NIHSS 1a Level of Conscious.: 0 1b LOC Questions: 0 1c LOC Commands: 0 2 Best Gaze: 0 3 Visual: 1 4 Facial Palsy: 0 5a Motor Arm - left: 0 5b Motor Arm - Right: 0 6a Motor Leg - Left: 1 6b Motor Leg - Right: 1 7 Limb Ataxia: 0 8 Sensory: 0 9 Best Language: 1 10 Dysarthria: 1 11 Extinct. and Inatten.: 0 TOTAL: 5   Labs I have reviewed labs in epic and the results pertinent to this consultation are:   CBC    Component Value Date/Time   WBC 9.4 07/24/2022 0411   RBC 4.55 07/24/2022 0411   HGB 14.6 09/25/2022 1015   HCT 43.0 09/25/2022 1015   PLT 239 07/24/2022 0411   MCV 82.0 07/24/2022 0411   MCH 27.0 07/24/2022 0411   MCHC 33.0 07/24/2022 0411   RDW 13.9 07/24/2022 0411   LYMPHSABS 2.7 06/05/2022 2344   MONOABS 0.7 06/05/2022 2344   EOSABS 0.3 06/05/2022 2344   BASOSABS 0.1 06/05/2022 2344    CMP     Component Value Date/Time   NA 141 09/25/2022 1015   K 4.1 09/25/2022 1015   CL 116  (H) 09/25/2022 1015   CO2 17 (L) 07/24/2022 0411   GLUCOSE 101 (H) 09/25/2022 1015   BUN 33 (H) 09/25/2022 1015   CREATININE 3.30 (H) 09/25/2022 1015   CALCIUM 8.5 (L) 07/24/2022 0411   CALCIUM 7.8 (L) 06/07/2022 0114   PROT 6.4 (L) 06/05/2022 2344   ALBUMIN 2.8 (L) 06/07/2022 0114   AST 15 06/05/2022 2344   ALT 9 06/05/2022 2344   ALKPHOS 70 06/05/2022 2344   BILITOT 0.6 06/05/2022 2344   GFRNONAA 12 (L) 07/24/2022 0411    Lipid Panel     Component Value Date/Time   CHOL 273 (H) 07/23/2022 0424   TRIG 149 07/23/2022 0424   HDL 41 07/23/2022 0424   CHOLHDL 6.7 07/23/2022  0424   VLDL 30 07/23/2022 0424   LDLCALC 202 (H) 07/23/2022 0424     Imaging I have reviewed the images obtained:  CT-head - no acute abnormality   MRI brain MRA Brain -  1. No acute intracranial process. 2. Unchanged severe chronic small-vessel disease with old lacunar infarcts in the right thalamus, left midbrain and left caudate. 3. Unchanged moderate stenosis of the proximal left P1 segment with diminished flow related enhancement in the left P2 branches, likely secondary to moderate-to-severe stenoses. 4. 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 5. 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  Assessment:  57 y.o. female presenting with bipolar disorder, cancer, COPD, CAD, diverticulitis, HTN, MI, seizure, and recent stroke presenting with slurred speech, dizziness, and weakness. She states that she started feeling weak on 6/17. On exam she does have a visual field deficit in her right upper quadrant but she states that she did not realize this until we tested her visual fields. Her blood pressure is currently elevated in the systolic 190s, elevated Cr. 3.30. MRI negative for acute infarct. Code stroke cancelled.   Impression: Hypertensive encephalopathy, hypertensive urgency  Code stroke recommendations -Emergent MRI, MRA to rule out acute  intracranial process, given unclear timing of visual field deficit onset  Visual field deficit may have been a TIA related to her left PCA stenosis.  She has had a recent stroke workup and given suspected etiology of this TIA, resumption of dual antiplatelet therapy for 90 days would be recommended.  No need to repeat echocardiogram in the absence of new cardiac symptoms  Recommendations: - Normalize blood pressure per EDP  - Counsel on importance of taking medications, would continue dual antiplatelet therapy for at least a 90-day course - Goal A1c less than 7%, goal LDL less than 70, may be followed up outpatient - Smoking and marijuana cessation counseling if she is still smoking - Diet and exercise counseling - Follow up with PCP outpatient for risk factor management - Medical clearance per ED provider  Patient seen and examined by NP/APP with MD. MD to update note as needed.   Elmer Picker, DNP, FNP-BC [facility] Pager: [phone]  Attending Neurologist's note:  I personally saw this patient, gathering history, performing a full neurologic examination, reviewing relevant labs, personally reviewing relevant imaging including head CT, MRI brain, and formulated the assessment and plan, adding the note above for completeness and clarity to accurately reflect my thoughts  Brooke Dare MD-PhD [facility] [phone]  Available 7 AM to 7 PM, outside these hours please contact Neurologist on call listed on AMION  CRITICAL CARE Performed by: Gordy Councilman   Total critical care time: 35 minutes  Critical care time was exclusive of separately billable procedures and treating other patients.  Critical care was necessary to treat or prevent imminent or life-threatening deterioration.  Critical care was time spent personally by me on the following activities: development of treatment plan with patient and/or surrogate as well as nursing, discussions  with consultants, evaluation of patient's response to treatment, examination of patient, obtaining history from patient or surrogate, ordering and performing treatments and interventions, ordering and review of laboratory studies, ordering and review of radiographic studies, pulse oximetry and re-evaluation of patient's condition.

## 2022-09-25 NOTE — ED Notes (Signed)
Danielle(daughter) updated on pt's current ED status. Daughter states that pt once had home health prior to moving to [facility] and would like to know if pt can have home health. Daughter's request sent to Dr Durwin Nora.

## 2022-09-26 ENCOUNTER — Other Ambulatory Visit (HOSPITAL_COMMUNITY): Payer: Self-pay

## 2022-10-12 ENCOUNTER — Emergency Department (HOSPITAL_COMMUNITY): Payer: Self-pay

## 2022-10-12 ENCOUNTER — Encounter (HOSPITAL_COMMUNITY): Payer: Self-pay

## 2022-10-12 ENCOUNTER — Other Ambulatory Visit: Payer: Self-pay

## 2022-10-12 ENCOUNTER — Inpatient Hospital Stay (HOSPITAL_COMMUNITY)
Admission: EM | Admit: 2022-10-12 | Discharge: 2022-10-15 | DRG: 392 | Disposition: A | Discharge status: Home / Self Care | Payer: Self-pay | Attending: Internal Medicine | Admitting: Internal Medicine

## 2022-10-12 DIAGNOSIS — N189 Chronic kidney disease, unspecified: Secondary | ICD-10-CM

## 2022-10-12 DIAGNOSIS — E669 Obesity, unspecified: Secondary | ICD-10-CM | POA: Diagnosis present

## 2022-10-12 DIAGNOSIS — E785 Hyperlipidemia, unspecified: Secondary | ICD-10-CM | POA: Diagnosis present

## 2022-10-12 DIAGNOSIS — Z7902 Long term (current) use of antithrombotics/antiplatelets: Secondary | ICD-10-CM

## 2022-10-12 DIAGNOSIS — E872 Acidosis, unspecified: Secondary | ICD-10-CM | POA: Diagnosis present

## 2022-10-12 DIAGNOSIS — F319 Bipolar disorder, unspecified: Secondary | ICD-10-CM | POA: Diagnosis present

## 2022-10-12 DIAGNOSIS — I2510 Atherosclerotic heart disease of native coronary artery without angina pectoris: Secondary | ICD-10-CM

## 2022-10-12 DIAGNOSIS — R112 Nausea with vomiting, unspecified: Principal | ICD-10-CM | POA: Diagnosis present

## 2022-10-12 DIAGNOSIS — I10 Essential (primary) hypertension: Secondary | ICD-10-CM | POA: Diagnosis present

## 2022-10-12 DIAGNOSIS — Z8249 Family history of ischemic heart disease and other diseases of the circulatory system: Secondary | ICD-10-CM

## 2022-10-12 DIAGNOSIS — Z6839 Body mass index (BMI) 39.0-39.9, adult: Secondary | ICD-10-CM

## 2022-10-12 DIAGNOSIS — I129 Hypertensive chronic kidney disease with stage 1 through stage 4 chronic kidney disease, or unspecified chronic kidney disease: Secondary | ICD-10-CM | POA: Diagnosis present

## 2022-10-12 DIAGNOSIS — N184 Chronic kidney disease, stage 4 (severe): Secondary | ICD-10-CM

## 2022-10-12 DIAGNOSIS — Z8673 Personal history of transient ischemic attack (TIA), and cerebral infarction without residual deficits: Secondary | ICD-10-CM

## 2022-10-12 DIAGNOSIS — Z7982 Long term (current) use of aspirin: Secondary | ICD-10-CM

## 2022-10-12 DIAGNOSIS — Z9049 Acquired absence of other specified parts of digestive tract: Secondary | ICD-10-CM

## 2022-10-12 DIAGNOSIS — Z79899 Other long term (current) drug therapy: Secondary | ICD-10-CM

## 2022-10-12 DIAGNOSIS — Z833 Family history of diabetes mellitus: Secondary | ICD-10-CM

## 2022-10-12 DIAGNOSIS — I252 Old myocardial infarction: Secondary | ICD-10-CM

## 2022-10-12 DIAGNOSIS — J449 Chronic obstructive pulmonary disease, unspecified: Secondary | ICD-10-CM | POA: Diagnosis present

## 2022-10-12 DIAGNOSIS — Z87891 Personal history of nicotine dependence: Secondary | ICD-10-CM

## 2022-10-12 DIAGNOSIS — I422 Other hypertrophic cardiomyopathy: Secondary | ICD-10-CM | POA: Diagnosis present

## 2022-10-12 LAB — I-STAT CHEM 8, ED
BUN: 40 mg/dL — ABNORMAL HIGH (ref 6–20)
Calcium, Ion: 1.02 mmol/L — ABNORMAL LOW (ref 1.15–1.40)
Chloride: 114 mmol/L — ABNORMAL HIGH (ref 98–111)
Creatinine, Ser: 3.7 mg/dL — ABNORMAL HIGH (ref 0.44–1.00)
Glucose, Bld: 109 mg/dL — ABNORMAL HIGH (ref 70–99)
HCT: 43 % (ref 36.0–46.0)
Hemoglobin: 14.6 g/dL (ref 12.0–15.0)
Potassium: 4 mmol/L (ref 3.5–5.1)
Sodium: 142 mmol/L (ref 135–145)
TCO2: 18 mmol/L — ABNORMAL LOW (ref 22–32)

## 2022-10-12 LAB — COMPREHENSIVE METABOLIC PANEL
ALT: 13 U/L (ref 0–44)
AST: 15 U/L (ref 15–41)
Albumin: 3.6 g/dL (ref 3.5–5.0)
Alkaline Phosphatase: 82 U/L (ref 38–126)
Anion gap: 13 (ref 5–15)
BUN: 43 mg/dL — ABNORMAL HIGH (ref 6–20)
CO2: 15 mmol/L — ABNORMAL LOW (ref 22–32)
Calcium: 8.8 mg/dL — ABNORMAL LOW (ref 8.9–10.3)
Chloride: 110 mmol/L (ref 98–111)
Creatinine, Ser: 3.43 mg/dL — ABNORMAL HIGH (ref 0.44–1.00)
GFR, Estimated: 15 mL/min — ABNORMAL LOW (ref >60)
Glucose, Bld: 111 mg/dL — ABNORMAL HIGH (ref 70–99)
Potassium: 4 mmol/L (ref 3.5–5.1)
Sodium: 138 mmol/L (ref 135–145)
Total Bilirubin: 0.5 mg/dL (ref 0.3–1.2)
Total Protein: 7.1 g/dL (ref 6.5–8.1)

## 2022-10-12 LAB — CBC WITH DIFFERENTIAL/PLATELET
Abs Immature Granulocytes: 0.06 10*3/uL (ref 0.00–0.07)
Basophils Absolute: 0.1 10*3/uL (ref 0.0–0.1)
Basophils Relative: 1 %
Eosinophils Absolute: 0.1 10*3/uL (ref 0.0–0.5)
Eosinophils Relative: 1 %
HCT: 42.5 % (ref 36.0–46.0)
Hemoglobin: 13.7 g/dL (ref 12.0–15.0)
Immature Granulocytes: 0 %
Lymphocytes Relative: 14 %
Lymphs Abs: 2 10*3/uL (ref 0.7–4.0)
MCH: 26 pg (ref 26.0–34.0)
MCHC: 32.2 g/dL (ref 30.0–36.0)
MCV: 80.8 fL (ref 80.0–100.0)
Monocytes Absolute: 0.9 10*3/uL (ref 0.1–1.0)
Monocytes Relative: 6 %
Neutro Abs: 10.7 10*3/uL — ABNORMAL HIGH (ref 1.7–7.7)
Neutrophils Relative %: 78 %
Platelets: 320 10*3/uL (ref 150–400)
RBC: 5.26 MIL/uL — ABNORMAL HIGH (ref 3.87–5.11)
RDW: 14.8 % (ref 11.5–15.5)
WBC: 13.8 10*3/uL — ABNORMAL HIGH (ref 4.0–10.5)
nRBC: 0 % (ref 0.0–0.2)

## 2022-10-12 LAB — LIPASE, BLOOD: Lipase: 45 U/L (ref 11–51)

## 2022-10-12 LAB — TROPONIN I (HIGH SENSITIVITY)
Troponin I (High Sensitivity): 53 ng/L — ABNORMAL HIGH (ref <18)
Troponin I (High Sensitivity): 54 ng/L — ABNORMAL HIGH (ref <18)

## 2022-10-12 MED ORDER — MORPHINE SULFATE (PF) 4 MG/ML IV SOLN
4.0000 mg | Freq: Once | INTRAVENOUS | Status: AC
  Administered 2022-10-12: 4 mg via INTRAVENOUS
  Filled 2022-10-12: qty 1

## 2022-10-12 MED ORDER — SODIUM CHLORIDE 0.9 % IV BOLUS
1000.0000 mL | Freq: Once | INTRAVENOUS | Status: AC
  Administered 2022-10-12: 1000 mL via INTRAVENOUS

## 2022-10-12 MED ORDER — ONDANSETRON HCL 4 MG/2ML IJ SOLN
4.0000 mg | Freq: Once | INTRAMUSCULAR | Status: AC
  Administered 2022-10-12: 4 mg via INTRAVENOUS
  Filled 2022-10-12: qty 2

## 2022-10-12 NOTE — ED Provider Notes (Signed)
[place] EMERGENCY DEPARTMENT AT [facility] Provider Note   CSN: [id number] Arrival date & time: 10/12/22  1731     History  No chief complaint on file.   Omnia Mote Strandberg is a 57 y.o. female.  57 year old female with prior medical history as detailed below presents for evaluation.  Patient reports 2 to 3 days of nausea, vomiting, diffuse abdominal discomfort.  Patient reports that she has been unable to take her p.o. medications secondary to persistent symptoms.  The history is provided by the patient and medical records.       Home Medications Prior to Admission medications   Medication Sig Start Date End Date Taking? Authorizing Provider  aspirin EC 81 MG tablet Take 1 tablet (81 mg total) by mouth daily. Swallow whole. 09/25/22  Yes Gloris Manchester, MD  atorvastatin (LIPITOR) 80 MG tablet Take 1 tablet (80 mg total) by mouth at bedtime. 09/25/22  Yes Gloris Manchester, MD  traZODone (DESYREL) 100 MG tablet Take 100 mg by mouth at bedtime.   Yes [provider]  acetaminophen (TYLENOL) 325 MG tablet Take 2 tablets (650 mg total) by mouth every 4 (four) hours as needed for headache or mild pain. Patient taking differently: Take 3 tablets by mouth 2 (two) times daily as needed for headache or mild pain. 11/29/20   Orpah Cobb, MD  albuterol (VENTOLIN HFA) 108 (90 Base) MCG/ACT inhaler Inhale 1-2 puffs into the lungs daily as needed for wheezing or shortness of breath.    [provider]  allopurinol (ZYLOPRIM) 100 MG tablet Take 1 tablet (100 mg total) by mouth daily. Patient not taking: Reported on 10/31/2021 11/29/20   Orpah Cobb, MD  amLODipine (NORVASC) 5 MG tablet Take 1 tablet (5 mg total) by mouth daily. 09/25/22   Gloris Manchester, MD  amLODipine (NORVASC) 5 MG tablet Take 1 tablet (5 mg total) by mouth daily. 09/26/22   Elmer Picker, NP  carvedilol (COREG) 3.125 MG tablet Take 1 tablet (3.125 mg total) by mouth 2 (two) times daily with a meal. 09/25/22    Gloris Manchester, MD  carvedilol (COREG) 3.125 MG tablet Take 1 tablet (3.125 mg total) by mouth 2 (two) times daily with a meal. 09/25/22   Elmer Picker, NP  clopidogrel (PLAVIX) 75 MG tablet Take 1 tablet (75 mg total) by mouth daily. 09/25/22   Gloris Manchester, MD  colchicine 0.6 MG tablet Take 1 tablet (0.6 mg total) by mouth daily. Patient not taking: Reported on 07/22/2022 06/08/22   Orpah Cobb, MD  hydrALAZINE (APRESOLINE) 25 MG tablet Take 1 tablet (25 mg total) by mouth 2 (two) times daily. 09/25/22   Gloris Manchester, MD  hydrALAZINE (APRESOLINE) 25 MG tablet Take 1 tablet (25 mg total) by mouth 2 (two) times daily. 09/25/22   Elmer Picker, NP  isosorbide mononitrate (IMDUR) 60 MG 24 hr tablet Take 1 tablet (60 mg total) by mouth daily. 09/25/22   Gloris Manchester, MD  isosorbide mononitrate (IMDUR) 60 MG 24 hr tablet Take 1 tablet (60 mg total) by mouth daily. 09/26/22   Elmer Picker, NP  nitroGLYCERIN (NITROSTAT) 0.4 MG SL tablet Place 0.4 mg under the tongue every 5 (five) minutes as needed for chest pain. Patient not taking: Reported on 10/31/2021    [provider]  sodium bicarbonate 650 MG tablet Take 1 tablet (650 mg total) by mouth 2 (two) times daily. Patient not taking: Reported on 07/22/2022 06/07/22   Orpah Cobb, MD      Allergies  Patient has no known allergies.    Review of Systems   Review of Systems  All other systems reviewed and are negative.   Physical Exam Updated Vital Signs BP (!) 167/118   Pulse 68   Temp 98.2 F (36.8 C)   Resp 10   SpO2 98%  Physical Exam Vitals and nursing note reviewed.  Constitutional:      General: She is not in acute distress.    Appearance: Normal appearance. She is well-developed.  HENT:     Head: Normocephalic and atraumatic.  Eyes:     Conjunctiva/sclera: Conjunctivae normal.     Pupils: Pupils are equal, round, and reactive to light.  Cardiovascular:     Rate and Rhythm: Normal rate and regular rhythm.     Heart sounds:  Normal heart sounds.  Pulmonary:     Effort: Pulmonary effort is normal. No respiratory distress.     Breath sounds: Normal breath sounds.  Abdominal:     General: There is no distension.     Palpations: Abdomen is soft.     Tenderness: There is no abdominal tenderness.  Musculoskeletal:        General: No deformity. Normal range of motion.     Cervical back: Normal range of motion and neck supple.  Skin:    General: Skin is warm and dry.  Neurological:     General: No focal deficit present.     Mental Status: She is alert and oriented to person, place, and time.     ED Results / Procedures / Treatments   Labs (all labs ordered are listed, but only abnormal results are displayed) Labs Reviewed  CBC WITH DIFFERENTIAL/PLATELET - Abnormal; Notable for the following components:      Result Value   WBC 13.8 (*)    RBC 5.26 (*)    Neutro Abs 10.7 (*)    All other components within normal limits  COMPREHENSIVE METABOLIC PANEL - Abnormal; Notable for the following components:   CO2 15 (*)    Glucose, Bld 111 (*)    BUN 43 (*)    Creatinine, Ser 3.43 (*)    Calcium 8.8 (*)    GFR, Estimated 15 (*)    All other components within normal limits  I-STAT CHEM 8, ED - Abnormal; Notable for the following components:   Chloride 114 (*)    BUN 40 (*)    Creatinine, Ser 3.70 (*)    Glucose, Bld 109 (*)    Calcium, Ion 1.02 (*)    TCO2 18 (*)    All other components within normal limits  TROPONIN I (HIGH SENSITIVITY) - Abnormal; Notable for the following components:   Troponin I (High Sensitivity) 53 (*)    All other components within normal limits  TROPONIN I (HIGH SENSITIVITY) - Abnormal; Notable for the following components:   Troponin I (High Sensitivity) 54 (*)    All other components within normal limits  LIPASE, BLOOD    EKG EKG Interpretation Date/Time:  Saturday October 12 2022 19:09:49 EDT Ventricular Rate:  74 PR Interval:  166 QRS Duration:  103 QT Interval:  421 QTC  Calculation: 468 R Axis:   25  Text Interpretation: Sinus rhythm Left atrial enlargement Minimal ST elevation, inferior leads Confirmed by Kristine Royal [phone]) on 10/12/2022 7:11:49 PM  Radiology CT ABDOMEN PELVIS WO CONTRAST  Result Date: 10/12/2022 CLINICAL DATA:  Abdominal pain. EXAM: CT ABDOMEN AND PELVIS WITHOUT CONTRAST TECHNIQUE: Multidetector CT imaging of the abdomen and  pelvis was performed following the standard protocol without IV contrast. RADIATION DOSE REDUCTION: This exam was performed according to the departmental dose-optimization program which includes automated exposure control, adjustment of the mA and/or kV according to patient size and/or use of iterative reconstruction technique. COMPARISON:  October 31, 2021 FINDINGS: Lower chest: No acute abnormality. Hepatobiliary: No focal liver abnormality is seen. Status post cholecystectomy. No biliary dilatation. Pancreas: Unremarkable. No pancreatic ductal dilatation or surrounding inflammatory changes. Spleen: Normal in size without focal abnormality. Adrenals/Urinary Tract: Adrenal glands are unremarkable. Kidneys are normal, without renal calculi, focal lesion, or hydronephrosis. The urinary bladder is poorly distended and subsequently limited in evaluation. Stomach/Bowel: Stomach is within normal limits. Appendix appears normal. No evidence of bowel wall thickening, distention, or inflammatory changes. Noninflamed diverticula are seen scattered throughout the large bowel. Vascular/Lymphatic: Aortic atherosclerosis. An inferior vena cava filter is noted. No enlarged abdominal or pelvic lymph nodes. Reproductive: Uterus and bilateral adnexa are unremarkable. Other: No abdominal wall hernia or abnormality. No abdominopelvic ascites. Musculoskeletal: No acute or significant osseous findings. IMPRESSION: 1. Colonic diverticulosis. 2. Evidence of prior cholecystectomy. 3. Aortic atherosclerosis. Aortic Atherosclerosis (ICD10-I70.0). Electronically  Signed   By: Aram Candela M.D.   On: 10/12/2022 20:28   DG Chest Port 1 View  Result Date: 10/12/2022 CLINICAL DATA:  Chest pain. EXAM: PORTABLE CHEST 1 VIEW COMPARISON:  05/28/2022 FINDINGS: The lungs are clear without focal pneumonia, edema, pneumothorax or pleural effusion. Cardiopericardial silhouette is at upper limits of normal for size. No acute bony abnormality. Lower cervical fusion hardware evident. IMPRESSION: No active disease. Electronically Signed   By: Kennith Center M.D.   On: 10/12/2022 18:30    Procedures Procedures    Medications Ordered in ED Medications  sodium chloride 0.9 % bolus 1,000 mL (0 mLs Intravenous Stopped 10/12/22 2304)  ondansetron (ZOFRAN) injection 4 mg (4 mg Intravenous Given 10/12/22 1912)  morphine (PF) 4 MG/ML injection 4 mg (4 mg Intravenous Given 10/12/22 1913)    ED Course/ Medical Decision Making/ A&P                             Medical Decision Making Amount and/or Complexity of Data Reviewed Labs: ordered. Radiology: ordered.  Risk Prescription drug management. Decision regarding hospitalization.    Medical Screen Complete  This patient presented to the ED with complaint of nausea, vomiting, abdominal pain.  This complaint involves an extensive number of treatment options. The initial differential diagnosis includes, but is not limited to, metabolic abnormality, intractable nausea and vomiting, other intra-abdominal pathology, etc.  This presentation is: Acute, Chronic, Self-Limited, Previously Undiagnosed, Uncertain Prognosis, Complicated, Systemic Symptoms, and Threat to Life/Bodily Function  Patient with prior medical history including CVA, CAD, COPD, morbid obesity, CKD, seizure disorder is with complaint of persistent nausea and vomiting x 2 to 3 days.  Patient with most recent admission in April for acute CVA.  Patient's nausea appears to be mildly improved with antiemetics administered in the ED.  Patient's presentation is  concerning given her persistent inability to maintain good p.o. over the last 72 hours.  Screening labs suggest mild AKI given a baseline creatinine of 3.0.  CT abdomen pelvis without acute abnormality.  Patient's case discussed with hospitalist service-Dr. Janalyn Shy -who is aware of case and will evaluate for admission.  Additional history obtained: External records from outside sources obtained and reviewed including prior ED visits and prior Inpatient records.    Lab Tests:  I ordered and personally interpreted labs.   Imaging Studies ordered:  I ordered imaging studies including CT ap  I independently visualized and interpreted obtained imaging which showed NAD I agree with the radiologist interpretation.   Cardiac Monitoring:  The patient was maintained on a cardiac monitor.  I personally viewed and interpreted the cardiac monitor which showed an underlying rhythm of: NSR   Medicines ordered:  I ordered medication including zofran  for nausea  Reevaluation of the patient after these medicines showed that the patient: improved   Problem List / ED Course:  Nausea and vomiting   Reevaluation:  After the interventions noted above, I reevaluated the patient and found that they have: improved  Disposition:  After consideration of the diagnostic results and the patients response to treatment, I feel that the patent would benefit from admission.          Final Clinical Impression(s) / ED Diagnoses Final diagnoses:  Nausea and vomiting, unspecified vomiting type  Chronic kidney disease, unspecified CKD stage    Rx / DC Orders ED Discharge Orders     None         Wynetta Fines, MD 10/13/22 0006

## 2022-10-12 NOTE — ED Triage Notes (Signed)
Pt BIB [facility] from home d/t n/v & dizziness for the last 3 days, has not been able to eat & also having chills, no fevers. Lt sided defect at baseline from Hx of stroke. A/Ox4, 180/110, 99% RA, 144 CBG.

## 2022-10-12 NOTE — ED Provider Notes (Incomplete)
EMERGENCY DEPARTMENT AT [facility] Provider Note   CSN: [id number] Arrival date & time: 10/12/22  1731     History {Add pertinent medical, surgical, social history, OB history to HPI:1} No chief complaint on file.   Colleen Curtis is a 57 y.o. female.  57 year old female with prior medical history as detailed below presents for evaluation.  Patient reports 2 to 3 days of nausea, vomiting, diffuse abdominal discomfort.  Patient reports that she has been unable to take her p.o. medications secondary to persistent symptoms.  The history is provided by the patient and medical records.       Home Medications Prior to Admission medications   Medication Sig Start Date End Date Taking? Authorizing Provider  aspirin EC 81 MG tablet Take 1 tablet (81 mg total) by mouth daily. Swallow whole. 09/25/22  Yes Gloris Manchester, MD  atorvastatin (LIPITOR) 80 MG tablet Take 1 tablet (80 mg total) by mouth at bedtime. 09/25/22  Yes Gloris Manchester, MD  traZODone (DESYREL) 100 MG tablet Take 100 mg by mouth at bedtime.   Yes [provider]  acetaminophen (TYLENOL) 325 MG tablet Take 2 tablets (650 mg total) by mouth every 4 (four) hours as needed for headache or mild pain. Patient taking differently: Take 3 tablets by mouth 2 (two) times daily as needed for headache or mild pain. 11/29/20   Orpah Cobb, MD  albuterol (VENTOLIN HFA) 108 (90 Base) MCG/ACT inhaler Inhale 1-2 puffs into the lungs daily as needed for wheezing or shortness of breath.    [provider]  allopurinol (ZYLOPRIM) 100 MG tablet Take 1 tablet (100 mg total) by mouth daily. Patient not taking: Reported on 10/31/2021 11/29/20   Orpah Cobb, MD  amLODipine (NORVASC) 5 MG tablet Take 1 tablet (5 mg total) by mouth daily. 09/25/22   Gloris Manchester, MD  amLODipine (NORVASC) 5 MG tablet Take 1 tablet (5 mg total) by mouth daily. 09/26/22   Elmer Picker, NP  carvedilol (COREG) 3.125 MG tablet Take 1  tablet (3.125 mg total) by mouth 2 (two) times daily with a meal. 09/25/22   Gloris Manchester, MD  carvedilol (COREG) 3.125 MG tablet Take 1 tablet (3.125 mg total) by mouth 2 (two) times daily with a meal. 09/25/22   Elmer Picker, NP  clopidogrel (PLAVIX) 75 MG tablet Take 1 tablet (75 mg total) by mouth daily. 09/25/22   Gloris Manchester, MD  colchicine 0.6 MG tablet Take 1 tablet (0.6 mg total) by mouth daily. Patient not taking: Reported on 07/22/2022 06/08/22   Orpah Cobb, MD  hydrALAZINE (APRESOLINE) 25 MG tablet Take 1 tablet (25 mg total) by mouth 2 (two) times daily. 09/25/22   Gloris Manchester, MD  hydrALAZINE (APRESOLINE) 25 MG tablet Take 1 tablet (25 mg total) by mouth 2 (two) times daily. 09/25/22   Elmer Picker, NP  isosorbide mononitrate (IMDUR) 60 MG 24 hr tablet Take 1 tablet (60 mg total) by mouth daily. 09/25/22   Gloris Manchester, MD  isosorbide mononitrate (IMDUR) 60 MG 24 hr tablet Take 1 tablet (60 mg total) by mouth daily. 09/26/22   Elmer Picker, NP  nitroGLYCERIN (NITROSTAT) 0.4 MG SL tablet Place 0.4 mg under the tongue every 5 (five) minutes as needed for chest pain. Patient not taking: Reported on 10/31/2021    [provider]  sodium bicarbonate 650 MG tablet Take 1 tablet (650 mg total) by mouth 2 (two) times daily. Patient not taking: Reported on 07/22/2022 06/07/22  Orpah Cobb, MD      Allergies    Patient has no known allergies.    Review of Systems   Review of Systems  All other systems reviewed and are negative.   Physical Exam Updated Vital Signs BP (!) 167/118   Pulse 68   Temp 98.2 F (36.8 C)   Resp 10   SpO2 98%  Physical Exam Vitals and nursing note reviewed.  Constitutional:      General: She is not in acute distress.    Appearance: Normal appearance. She is well-developed.  HENT:     Head: Normocephalic and atraumatic.  Eyes:     Conjunctiva/sclera: Conjunctivae normal.     Pupils: Pupils are equal, round, and reactive to light.  Cardiovascular:      Rate and Rhythm: Normal rate and regular rhythm.     Heart sounds: Normal heart sounds.  Pulmonary:     Effort: Pulmonary effort is normal. No respiratory distress.     Breath sounds: Normal breath sounds.  Abdominal:     General: There is no distension.     Palpations: Abdomen is soft.     Tenderness: There is no abdominal tenderness.  Musculoskeletal:        General: No deformity. Normal range of motion.     Cervical back: Normal range of motion and neck supple.  Skin:    General: Skin is warm and dry.  Neurological:     General: No focal deficit present.     Mental Status: She is alert and oriented to person, place, and time.     ED Results / Procedures / Treatments   Labs (all labs ordered are listed, but only abnormal results are displayed) Labs Reviewed  CBC WITH DIFFERENTIAL/PLATELET - Abnormal; Notable for the following components:      Result Value   WBC 13.8 (*)    RBC 5.26 (*)    Neutro Abs 10.7 (*)    All other components within normal limits  COMPREHENSIVE METABOLIC PANEL - Abnormal; Notable for the following components:   CO2 15 (*)    Glucose, Bld 111 (*)    BUN 43 (*)    Creatinine, Ser 3.43 (*)    Calcium 8.8 (*)    GFR, Estimated 15 (*)    All other components within normal limits  I-STAT CHEM 8, ED - Abnormal; Notable for the following components:   Chloride 114 (*)    BUN 40 (*)    Creatinine, Ser 3.70 (*)    Glucose, Bld 109 (*)    Calcium, Ion 1.02 (*)    TCO2 18 (*)    All other components within normal limits  TROPONIN I (HIGH SENSITIVITY) - Abnormal; Notable for the following components:   Troponin I (High Sensitivity) 53 (*)    All other components within normal limits  TROPONIN I (HIGH SENSITIVITY) - Abnormal; Notable for the following components:   Troponin I (High Sensitivity) 54 (*)    All other components within normal limits  LIPASE, BLOOD    EKG EKG Interpretation Date/Time:  Saturday October 12 2022 19:09:49 EDT Ventricular  Rate:  74 PR Interval:  166 QRS Duration:  103 QT Interval:  421 QTC Calculation: 468 R Axis:   25  Text Interpretation: Sinus rhythm Left atrial enlargement Minimal ST elevation, inferior leads Confirmed by Kristine Royal [phone]) on 10/12/2022 7:11:49 PM  Radiology CT ABDOMEN PELVIS WO CONTRAST  Result Date: 10/12/2022 CLINICAL DATA:  Abdominal pain. EXAM: CT ABDOMEN  AND PELVIS WITHOUT CONTRAST TECHNIQUE: Multidetector CT imaging of the abdomen and pelvis was performed following the standard protocol without IV contrast. RADIATION DOSE REDUCTION: This exam was performed according to the departmental dose-optimization program which includes automated exposure control, adjustment of the mA and/or kV according to patient size and/or use of iterative reconstruction technique. COMPARISON:  October 31, 2021 FINDINGS: Lower chest: No acute abnormality. Hepatobiliary: No focal liver abnormality is seen. Status post cholecystectomy. No biliary dilatation. Pancreas: Unremarkable. No pancreatic ductal dilatation or surrounding inflammatory changes. Spleen: Normal in size without focal abnormality. Adrenals/Urinary Tract: Adrenal glands are unremarkable. Kidneys are normal, without renal calculi, focal lesion, or hydronephrosis. The urinary bladder is poorly distended and subsequently limited in evaluation. Stomach/Bowel: Stomach is within normal limits. Appendix appears normal. No evidence of bowel wall thickening, distention, or inflammatory changes. Noninflamed diverticula are seen scattered throughout the large bowel. Vascular/Lymphatic: Aortic atherosclerosis. An inferior vena cava filter is noted. No enlarged abdominal or pelvic lymph nodes. Reproductive: Uterus and bilateral adnexa are unremarkable. Other: No abdominal wall hernia or abnormality. No abdominopelvic ascites. Musculoskeletal: No acute or significant osseous findings. IMPRESSION: 1. Colonic diverticulosis. 2. Evidence of prior cholecystectomy. 3.  Aortic atherosclerosis. Aortic Atherosclerosis (ICD10-I70.0). Electronically Signed   By: Aram Candela M.D.   On: 10/12/2022 20:28   DG Chest Port 1 View  Result Date: 10/12/2022 CLINICAL DATA:  Chest pain. EXAM: PORTABLE CHEST 1 VIEW COMPARISON:  05/28/2022 FINDINGS: The lungs are clear without focal pneumonia, edema, pneumothorax or pleural effusion. Cardiopericardial silhouette is at upper limits of normal for size. No acute bony abnormality. Lower cervical fusion hardware evident. IMPRESSION: No active disease. Electronically Signed   By: Kennith Center M.D.   On: 10/12/2022 18:30    Procedures Procedures  {Document cardiac monitor, telemetry assessment procedure when appropriate:1}  Medications Ordered in ED Medications  sodium chloride 0.9 % bolus 1,000 mL (0 mLs Intravenous Stopped 10/12/22 2304)  ondansetron (ZOFRAN) injection 4 mg (4 mg Intravenous Given 10/12/22 1912)  morphine (PF) 4 MG/ML injection 4 mg (4 mg Intravenous Given 10/12/22 1913)    ED Course/ Medical Decision Making/ A&P   {   Click here for ABCD2, HEART and other calculatorsREFRESH Note before signing :1}                          Medical Decision Making Amount and/or Complexity of Data Reviewed Labs: ordered. Radiology: ordered.  Risk Prescription drug management. Decision regarding hospitalization.    Medical Screen Complete  This patient presented to the ED with complaint of nausea, vomiting, abdominal pain.  This complaint involves an extensive number of treatment options. The initial differential diagnosis includes, but is not limited to, metabolic abnormality, intractable nausea and vomiting, other intra-abdominal pathology, etc.  This presentation is: Acute, Chronic, Self-Limited, Previously Undiagnosed, Uncertain Prognosis, Complicated, Systemic Symptoms, and Threat to Life/Bodily Function  Patient with prior medical history including CVA, CAD, COPD, morbid obesity, CKD, seizure disorder is with  complaint of persistent nausea and vomiting x 2 to 3 days.      Co morbidities that complicated the patient's evaluation  ***   Additional history obtained:  Additional history obtained from {History source:19196::"EMS","Spouse","Family","Friend","Caregiver"} External records from outside sources obtained and reviewed including prior ED visits and prior Inpatient records.    Lab Tests:  I ordered and personally interpreted labs.  The pertinent results include:  ***   Imaging Studies ordered:  I ordered imaging studies including ***  I independently visualized and interpreted obtained imaging which showed *** I agree with the radiologist interpretation.   Cardiac Monitoring:  The patient was maintained on a cardiac monitor.  I personally viewed and interpreted the cardiac monitor which showed an underlying rhythm of: ***   Medicines ordered:  I ordered medication including ***  for ***  Reevaluation of the patient after these medicines showed that the patient: {resolved/improved/worsened:23923::"improved"}    Test Considered:  ***   Critical Interventions:  ***   Consultations Obtained:  I consulted ***,  and discussed lab and imaging findings as well as pertinent plan of care.    Problem List / ED Course:  ***   Reevaluation:  After the interventions noted above, I reevaluated the patient and found that they have: {resolved/improved/worsened:23923::"improved"}   Social Determinants of Health:  ***   Disposition:  After consideration of the diagnostic results and the patients response to treatment, I feel that the patent would benefit from ***.    {Document critical care time when appropriate:1} {Document review of labs and clinical decision tools ie heart score, Chads2Vasc2 etc:1}  {Document your independent review of radiology images, and any outside records:1} {Document your discussion with family members, caretakers, and with  consultants:1} {Document social determinants of health affecting pt's care:1} {Document your decision making why or why not admission, treatments were needed:1} Final Clinical Impression(s) / ED Diagnoses Final diagnoses:  Nausea and vomiting, unspecified vomiting type  Chronic kidney disease, unspecified CKD stage    Rx / DC Orders ED Discharge Orders     None

## 2022-10-12 NOTE — ED Notes (Signed)
Pt care taken, complaining of nausea and vomitingx 3 days, abdominal pain. Gave the medication ordered.

## 2022-10-13 DIAGNOSIS — I2510 Atherosclerotic heart disease of native coronary artery without angina pectoris: Secondary | ICD-10-CM

## 2022-10-13 DIAGNOSIS — R112 Nausea with vomiting, unspecified: Principal | ICD-10-CM

## 2022-10-13 DIAGNOSIS — N184 Chronic kidney disease, stage 4 (severe): Secondary | ICD-10-CM

## 2022-10-13 LAB — BASIC METABOLIC PANEL
Anion gap: 9 (ref 5–15)
BUN: 41 mg/dL — ABNORMAL HIGH (ref 6–20)
CO2: 17 mmol/L — ABNORMAL LOW (ref 22–32)
Calcium: 8.5 mg/dL — ABNORMAL LOW (ref 8.9–10.3)
Chloride: 111 mmol/L (ref 98–111)
Creatinine, Ser: 3.39 mg/dL — ABNORMAL HIGH (ref 0.44–1.00)
GFR, Estimated: 15 mL/min — ABNORMAL LOW (ref >60)
Glucose, Bld: 92 mg/dL (ref 70–99)
Potassium: 3.8 mmol/L (ref 3.5–5.1)
Sodium: 137 mmol/L (ref 135–145)

## 2022-10-13 LAB — RAPID URINE DRUG SCREEN, HOSP PERFORMED
Amphetamines: NOT DETECTED
Barbiturates: NOT DETECTED
Benzodiazepines: NOT DETECTED
Cocaine: NOT DETECTED
Opiates: POSITIVE — AB
Tetrahydrocannabinol: POSITIVE — AB

## 2022-10-13 LAB — CBC
HCT: 39.8 % (ref 36.0–46.0)
Hemoglobin: 13 g/dL (ref 12.0–15.0)
MCH: 26.4 pg (ref 26.0–34.0)
MCHC: 32.7 g/dL (ref 30.0–36.0)
MCV: 80.7 fL (ref 80.0–100.0)
Platelets: 273 10*3/uL (ref 150–400)
RBC: 4.93 MIL/uL (ref 3.87–5.11)
RDW: 14.7 % (ref 11.5–15.5)
WBC: 11.7 10*3/uL — ABNORMAL HIGH (ref 4.0–10.5)
nRBC: 0 % (ref 0.0–0.2)

## 2022-10-13 MED ORDER — HYDRALAZINE HCL 25 MG PO TABS
25.0000 mg | ORAL_TABLET | Freq: Two times a day (BID) | ORAL | Status: DC
  Administered 2022-10-13 – 2022-10-15 (×4): 25 mg via ORAL
  Filled 2022-10-13 (×5): qty 1

## 2022-10-13 MED ORDER — ACETAMINOPHEN 650 MG RE SUPP: 650.0000 mg | Freq: Four times a day (QID) | RECTAL | Status: DC | PRN

## 2022-10-13 MED ORDER — HYDROMORPHONE HCL 1 MG/ML IJ SOLN
0.5000 mg | INTRAMUSCULAR | Status: AC | PRN
  Administered 2022-10-13: 0.5 mg via INTRAVENOUS
  Filled 2022-10-13: qty 1

## 2022-10-13 MED ORDER — HYDROMORPHONE HCL 1 MG/ML IJ SOLN
0.5000 mg | Freq: Once | INTRAMUSCULAR | Status: AC | PRN
  Administered 2022-10-13: 0.5 mg via INTRAVENOUS
  Filled 2022-10-13: qty 1

## 2022-10-13 MED ORDER — CARVEDILOL 3.125 MG PO TABS
3.1250 mg | ORAL_TABLET | Freq: Two times a day (BID) | ORAL | Status: DC
  Administered 2022-10-13 – 2022-10-15 (×5): 3.125 mg via ORAL
  Filled 2022-10-13 (×5): qty 1

## 2022-10-13 MED ORDER — AMLODIPINE BESYLATE 5 MG PO TABS
5.0000 mg | ORAL_TABLET | Freq: Every day | ORAL | Status: DC
  Administered 2022-10-13 – 2022-10-15 (×3): 5 mg via ORAL
  Filled 2022-10-13 (×3): qty 1

## 2022-10-13 MED ORDER — ACETAMINOPHEN 325 MG PO TABS
650.0000 mg | ORAL_TABLET | Freq: Four times a day (QID) | ORAL | Status: DC | PRN
  Administered 2022-10-14: 650 mg via ORAL
  Filled 2022-10-13: qty 2

## 2022-10-13 MED ORDER — ISOSORBIDE MONONITRATE ER 60 MG PO TB24
60.0000 mg | ORAL_TABLET | Freq: Every day | ORAL | Status: DC
  Administered 2022-10-13 – 2022-10-15 (×3): 60 mg via ORAL
  Filled 2022-10-13 (×3): qty 1

## 2022-10-13 MED ORDER — ASPIRIN 81 MG PO TBEC
81.0000 mg | DELAYED_RELEASE_TABLET | Freq: Every day | ORAL | Status: DC
  Administered 2022-10-13 – 2022-10-15 (×3): 81 mg via ORAL
  Filled 2022-10-13 (×3): qty 1

## 2022-10-13 MED ORDER — ATORVASTATIN CALCIUM 80 MG PO TABS
80.0000 mg | ORAL_TABLET | Freq: Every day | ORAL | Status: DC
  Administered 2022-10-13 – 2022-10-14 (×2): 80 mg via ORAL
  Filled 2022-10-13 (×2): qty 1

## 2022-10-13 MED ORDER — HEPARIN SODIUM (PORCINE) 5000 UNIT/ML IJ SOLN
5000.0000 [IU] | Freq: Three times a day (TID) | INTRAMUSCULAR | Status: DC
  Administered 2022-10-13 – 2022-10-15 (×6): 5000 [IU] via SUBCUTANEOUS
  Filled 2022-10-13 (×6): qty 1

## 2022-10-13 MED ORDER — NALOXONE HCL 0.4 MG/ML IJ SOLN: 0.4000 mg | INTRAMUSCULAR | Status: DC | PRN

## 2022-10-13 MED ORDER — SODIUM CHLORIDE 0.9 % IV SOLN: INTRAVENOUS | Status: AC

## 2022-10-13 MED ORDER — PANTOPRAZOLE SODIUM 40 MG IV SOLR
40.0000 mg | Freq: Two times a day (BID) | INTRAVENOUS | Status: DC
  Administered 2022-10-13 – 2022-10-14 (×3): 40 mg via INTRAVENOUS
  Filled 2022-10-13 (×4): qty 10

## 2022-10-13 MED ORDER — ONDANSETRON HCL 4 MG/2ML IJ SOLN
4.0000 mg | Freq: Four times a day (QID) | INTRAMUSCULAR | Status: DC | PRN
  Administered 2022-10-13: 4 mg via INTRAVENOUS
  Filled 2022-10-13: qty 2

## 2022-10-13 NOTE — ED Notes (Signed)
..ED TO INPATIENT HANDOFF REPORT  ED Nurse Name and Phone #: [phone]  S Name/Age/Gender Colleen Curtis 57 y.o. female Room/Bed: 022C/022C  Code Status   Code Status: Prior  Home/SNF/Other Home Patient oriented to: self, place, time, and situation Is this baseline? Yes   Triage Complete: Triage complete  Chief Complaint Intractable nausea and vomiting [R11.2]  Triage Note Pt BIB [facility] from home d/t n/v & dizziness for the last 3 days, has not been able to eat & also having chills, no fevers. Lt sided defect at baseline from Hx of stroke. A/Ox4, 180/110, 99% RA, 144 CBG.     Allergies No Known Allergies  Level of Care/Admitting Diagnosis ED Disposition     ED Disposition  Admit   Condition  --   Comment  Hospital Area: [facility] [[id number]]  Level of Care: Med-Surg [16]  May place patient in observation at [facility] or [facility] if equivalent level of care is available:: Yes  Covid Evaluation: Asymptomatic - no recent exposure (last 10 days) testing not required  Diagnosis: Intractable nausea and vomiting [720114]  Admitting Physician: John Giovanni [[id number]]  Attending Physician: John Giovanni [[id number]]          B Medical/Surgery History Past Medical History:  Diagnosis Date   Bipolar disorder (HCC)    Cancer (HCC)    COPD (chronic obstructive pulmonary disease) (HCC)    Coronary artery disease    Diverticulitis    Hypertension    Myocardial infarction (HCC)    Seizure (HCC)    Stroke (HCC)    Past Surgical History:  Procedure Laterality Date   ABDOMINAL SURGERY     BRAIN SURGERY     CHOLECYSTECTOMY     LEFT HEART CATH AND CORONARY ANGIOGRAPHY N/A 11/27/2020   Procedure: LEFT HEART CATH AND CORONARY ANGIOGRAPHY;  Surgeon: Orpah Cobb, MD;  Location: [facility] INVASIVE CV LAB;  Service: Cardiovascular;  Laterality: N/A;     A IV Location/Drains/Wounds Patient Lines/Drains/Airways Status     Active  Line/Drains/Airways     Name Placement date Placement time Site Days   Peripheral IV 10/12/22 20 G 1.88" Anterior;Right Forearm 10/12/22  1901  Forearm  1   Pressure Injury 07/23/22 Toe (Comment  which one) Right;Left 07/23/22  0200  -- 82   Pressure Injury 07/23/22 Foot Left Stage 2 -  Partial thickness loss of dermis presenting as a shallow open injury with a red, pink wound bed without slough. 07/23/22  0200  -- 82            Intake/Output Last 24 hours No intake or output data in the 24 hours ending 10/13/22 0019  Labs/Imaging Results for orders placed or performed during the hospital encounter of 10/12/22 (from the past 48 hour(s))  Troponin I (High Sensitivity)     Status: Abnormal   Collection Time: 10/12/22  7:25 PM  Result Value Ref Range   Troponin I (High Sensitivity) 53 (H) <18 ng/L    Comment: (NOTE) Elevated high sensitivity troponin I (hsTnI) values and significant  changes across serial measurements may suggest ACS but many other  chronic and acute conditions are known to elevate hsTnI results.  Refer to the "Links" section for chest pain algorithms and additional  guidance. Performed at [facility], [address], [place], [place] [address]   CBC with Differential     Status: Abnormal   Collection Time: 10/12/22  7:25 PM  Result Value Ref Range  WBC 13.8 (H) 4.0 - 10.5 K/uL   RBC 5.26 (H) 3.87 - 5.11 MIL/uL   Hemoglobin 13.7 12.0 - 15.0 g/dL   HCT 09.8 11.9 - 14.7 %   MCV 80.8 80.0 - 100.0 fL   MCH 26.0 26.0 - 34.0 pg   MCHC 32.2 30.0 - 36.0 g/dL   RDW 82.9 56.2 - 13.0 %   Platelets 320 150 - 400 K/uL   nRBC 0.0 0.0 - 0.2 %   Neutrophils Relative % 78 %   Neutro Abs 10.7 (H) 1.7 - 7.7 K/uL   Lymphocytes Relative 14 %   Lymphs Abs 2.0 0.7 - 4.0 K/uL   Monocytes Relative 6 %   Monocytes Absolute 0.9 0.1 - 1.0 K/uL   Eosinophils Relative 1 %   Eosinophils Absolute 0.1 0.0 - 0.5 K/uL   Basophils Relative 1 %   Basophils Absolute 0.1 0.0 - 0.1  K/uL   Immature Granulocytes 0 %   Abs Immature Granulocytes 0.06 0.00 - 0.07 K/uL    Comment: Performed at [facility], [address], [place], [place] [address]  Comprehensive metabolic panel     Status: Abnormal   Collection Time: 10/12/22  7:25 PM  Result Value Ref Range   Sodium 138 135 - 145 mmol/L   Potassium 4.0 3.5 - 5.1 mmol/L   Chloride 110 98 - 111 mmol/L   CO2 15 (L) 22 - 32 mmol/L   Glucose, Bld 111 (H) 70 - 99 mg/dL    Comment: Glucose reference range applies only to samples taken after fasting for at least 8 hours.   BUN 43 (H) 6 - 20 mg/dL   Creatinine, Ser 4.69 (H) 0.44 - 1.00 mg/dL   Calcium 8.8 (L) 8.9 - 10.3 mg/dL   Total Protein 7.1 6.5 - 8.1 g/dL   Albumin 3.6 3.5 - 5.0 g/dL   AST 15 15 - 41 U/L   ALT 13 0 - 44 U/L   Alkaline Phosphatase 82 38 - 126 U/L   Total Bilirubin 0.5 0.3 - 1.2 mg/dL   GFR, Estimated 15 (L) >60 mL/min    Comment: (NOTE) Calculated using the CKD-EPI Creatinine Equation (2021)    Anion gap 13 5 - 15    Comment: Performed at [facility], [address] [place], [place] [address]  Lipase, blood     Status: None   Collection Time: 10/12/22  7:25 PM  Result Value Ref Range   Lipase 45 11 - 51 U/L    Comment: Performed at [facility], [address] [place], [place] [address]  I-stat chem 8, ED     Status: Abnormal   Collection Time: 10/12/22  7:47 PM  Result Value Ref Range   Sodium 142 135 - 145 mmol/L   Potassium 4.0 3.5 - 5.1 mmol/L   Chloride 114 (H) 98 - 111 mmol/L   BUN 40 (H) 6 - 20 mg/dL   Creatinine, Ser 4.40 (H) 0.44 - 1.00 mg/dL   Glucose, Bld 102 (H) 70 - 99 mg/dL    Comment: Glucose reference range applies only to samples taken after fasting for at least 8 hours.   Calcium, Ion 1.02 (L) 1.15 - 1.40 mmol/L   TCO2 18 (L) 22 - 32 mmol/L   Hemoglobin 14.6 12.0 - 15.0 g/dL   HCT 72.5 36.6 - 44.0 %  Troponin I (High Sensitivity)     Status: Abnormal   Collection Time: 10/12/22  8:27 PM  Result  Value Ref Range   Troponin I (High Sensitivity) 54 (H) <18 ng/L    Comment: (NOTE) Elevated high sensitivity troponin I (hsTnI) values and significant  changes across serial measurements may suggest ACS but many other  chronic and acute conditions are known to elevate hsTnI results.  Refer to the "Links" section for chest pain algorithms and additional  guidance. Performed at [facility], [address], [place], [place] [address]    CT ABDOMEN PELVIS WO CONTRAST  Result Date: 10/12/2022 CLINICAL DATA:  Abdominal pain. EXAM: CT ABDOMEN AND PELVIS WITHOUT CONTRAST TECHNIQUE: Multidetector CT imaging of the abdomen and pelvis was performed following the standard protocol without IV contrast. RADIATION DOSE REDUCTION: This exam was performed according to the departmental dose-optimization program which includes automated exposure control, adjustment of the mA and/or kV according to patient size and/or use of iterative reconstruction technique. COMPARISON:  October 31, 2021 FINDINGS: Lower chest: No acute abnormality. Hepatobiliary: No focal liver abnormality is seen. Status post cholecystectomy. No biliary dilatation. Pancreas: Unremarkable. No pancreatic ductal dilatation or surrounding inflammatory changes. Spleen: Normal in size without focal abnormality. Adrenals/Urinary Tract: Adrenal glands are unremarkable. Kidneys are normal, without renal calculi, focal lesion, or hydronephrosis. The urinary bladder is poorly distended and subsequently limited in evaluation. Stomach/Bowel: Stomach is within normal limits. Appendix appears normal. No evidence of bowel wall thickening, distention, or inflammatory changes. Noninflamed diverticula are seen scattered throughout the large bowel. Vascular/Lymphatic: Aortic atherosclerosis. An inferior vena cava filter is noted. No enlarged abdominal or pelvic lymph nodes. Reproductive: Uterus and bilateral adnexa are unremarkable. Other: No abdominal wall hernia or  abnormality. No abdominopelvic ascites. Musculoskeletal: No acute or significant osseous findings. IMPRESSION: 1. Colonic diverticulosis. 2. Evidence of prior cholecystectomy. 3. Aortic atherosclerosis. Aortic Atherosclerosis (ICD10-I70.0). Electronically Signed   By: Aram Candela M.D.   On: 10/12/2022 20:28   DG Chest Port 1 View  Result Date: 10/12/2022 CLINICAL DATA:  Chest pain. EXAM: PORTABLE CHEST 1 VIEW COMPARISON:  05/28/2022 FINDINGS: The lungs are clear without focal pneumonia, edema, pneumothorax or pleural effusion. Cardiopericardial silhouette is at upper limits of normal for size. No acute bony abnormality. Lower cervical fusion hardware evident. IMPRESSION: No active disease. Electronically Signed   By: Kennith Center M.D.   On: 10/12/2022 18:30    Pending Labs Unresulted Labs (From admission, onward)    None       Vitals/Pain Today's Vitals   10/12/22 2300 10/12/22 2307 10/12/22 2345 10/12/22 2350  BP: (!) 167/107  (!) 167/118   Pulse: 69  68   Resp: 11  10   Temp: 98.2 F (36.8 C)  98.2 F (36.8 C)   TempSrc:      SpO2: 98%  98%   PainSc:  4   4     Isolation Precautions No active isolations  Medications Medications  sodium chloride 0.9 % bolus 1,000 mL (0 mLs Intravenous Stopped 10/12/22 2304)  ondansetron (ZOFRAN) injection 4 mg (4 mg Intravenous Given 10/12/22 1912)  morphine (PF) 4 MG/ML injection 4 mg (4 mg Intravenous Given 10/12/22 1913)    Mobility walks     Focused Assessments neuro   R Recommendations: See Admitting Provider Note  Report given to:   Additional Notes: pt alert and oriented and said that she was dizzy for 3 days. Now she still doesn't feel well but she is no longer nauseous

## 2022-10-13 NOTE — H&P (Signed)
History and Physical    Mickie Ziebell UJW:[id number] DOB: [birth year] DOA: 10/12/2022  PCP: Pcp, No  Patient coming from: Home  Chief Complaint: Vomiting, abdominal pain  HPI: Meschelle Sass is a 57 y.o. 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  Afebrile.  Labs showing WBC 13.8, bicarb 15, anion gap 13, glucose 111, BUN 43, creatinine 3.4 (at baseline), normal lipase and LFTs, troponin 53> 54.  EKG without acute ischemic changes.  Chest x-ray showing no active disease.  CT abdomen pelvis negative for acute finding.  Patient received morphine, Zofran, 1 L normal saline, and Dilaudid in the ED.  TR[facility] called to admit as patient remained symptomatic.  Patient reports 2-day history of sharp epigastric abdominal pain, nausea, and nonbloody emesis.  She has not been able to tolerate p.o. intake.  Denies trying new food or sick contacts.  Denies fevers, chest pain, or shortness of breath.  Reports history of marijuana use but states she has not used it for several weeks.  No other complaints.  Review of Systems:  Review of Systems  All other systems reviewed and are negative.   Past Medical History:  Diagnosis Date   Bipolar disorder (HCC)    Cancer (HCC)    COPD (chronic obstructive pulmonary disease) (HCC)    Coronary artery disease    Diverticulitis    Hypertension    Myocardial infarction (HCC)    Seizure (HCC)    Stroke [facility])     Past Surgical History:  Procedure Laterality Date   ABDOMINAL SURGERY     BRAIN SURGERY     CHOLECYSTECTOMY     LEFT HEART CATH AND CORONARY ANGIOGRAPHY N/A 11/27/2020   Procedure: LEFT HEART CATH AND CORONARY ANGIOGRAPHY;  Surgeon: Orpah Cobb, MD;  Location: [facility]  INVASIVE CV LAB;  Service: Cardiovascular;  Laterality: N/A;     reports that she has been smoking cigars. She has never used smokeless tobacco. She reports current drug use. Drug: Marijuana. She reports that she does not drink alcohol.  No Known Allergies  Family History  Problem Relation Age of Onset   Heart disease Mother    Hypertension Mother    Heart disease Father    Diabetes Mellitus II Father    Arrhythmia Brother     Prior to Admission medications   Medication Sig Start Date End Date Taking? Authorizing Provider  acetaminophen (TYLENOL) 325 MG tablet Take 2 tablets (650 mg total) by mouth every 4 (four) hours as needed for headache or mild pain. Patient taking differently: Take 3 tablets by mouth 2 (two) times daily as needed for headache or mild pain. 11/29/20  Yes Orpah Cobb, MD  amLODipine (NORVASC) 5 MG tablet Take 1 tablet (5 mg total) by mouth daily. 09/26/22  Yes Elmer Picker, NP  aspirin EC 81 MG tablet Take 1 tablet (81 mg total) by mouth daily. Swallow whole. 09/25/22  Yes Gloris Manchester, MD  atorvastatin (LIPITOR) 80 MG tablet Take 1 tablet (80 mg total) by mouth at bedtime. 09/25/22  Yes Gloris Manchester, MD  carvedilol (COREG) 3.125 MG tablet Take 1 tablet (3.125 mg total) by mouth 2 (two) times daily with a meal. 09/25/22  Yes Elmer Picker, NP  hydrALAZINE (APRESOLINE) 25 MG tablet Take 1 tablet (25 mg total) by mouth 2 (two) times  daily. 09/25/22  Yes Pamalee Leyden, Ludger Nutting, NP  isosorbide mononitrate (IMDUR) 60 MG 24 hr tablet Take 1 tablet (60 mg total) by mouth daily. 09/25/22  Yes Gloris Manchester, MD  clopidogrel (PLAVIX) 75 MG tablet Take 1 tablet (75 mg total) by mouth daily. Patient not taking: Reported on 10/13/2022 09/25/22   Gloris Manchester, MD    Physical Exam: Vitals:   10/12/22 2300 10/12/22 2345 10/13/22 0039 10/13/22 0110  BP: (!) 167/107 (!) 167/118 (!) 157/92 (!) 158/104  Pulse: 69 68 73 65  Resp: 11 10 12 17   Temp: 98.2 F (36.8 C) 98.2 F (36.8 C)  98.1 F (36.7  C)  TempSrc:    Oral  SpO2: 98% 98% 98% 100%  Weight:    99.9 kg  Height:    5\' 3"  (1.6 m)    Physical Exam Vitals reviewed.  Constitutional:      General: She is not in acute distress. HENT:     Head: Normocephalic and atraumatic.  Eyes:     Extraocular Movements: Extraocular movements intact.  Cardiovascular:     Rate and Rhythm: Normal rate and regular rhythm.     Pulses: Normal pulses.  Pulmonary:     Effort: Pulmonary effort is normal. No respiratory distress.     Breath sounds: Normal breath sounds. No wheezing or rales.  Abdominal:     General: Bowel sounds are normal. There is no distension.     Palpations: Abdomen is soft.     Tenderness: There is no abdominal tenderness. There is no guarding or rebound.  Musculoskeletal:     Cervical back: Normal range of motion.     Right lower leg: No edema.     Left lower leg: No edema.  Skin:    General: Skin is warm and dry.  Neurological:     General: No focal deficit present.     Mental Status: She is alert and oriented to person, place, and time.     Labs on Admission: I have personally reviewed following labs and imaging studies  CBC: Recent Labs  Lab 10/12/22 1925 10/12/22 1947  WBC 13.8*  --   NEUTROABS 10.7*  --   HGB 13.7 14.6  HCT 42.5 43.0  MCV 80.8  --   PLT 320  --    Basic Metabolic Panel: Recent Labs  Lab 10/12/22 1925 10/12/22 1947  NA 138 142  K 4.0 4.0  CL 110 114*  CO2 15*  --   GLUCOSE 111* 109*  BUN 43* 40*  CREATININE 3.43* 3.70*  CALCIUM 8.8*  --    GFR: Estimated Creatinine Clearance: 18.5 mL/min (A) (by C-G formula based on SCr of 3.7 mg/dL (H)). Liver Function Tests: Recent Labs  Lab 10/12/22 1925  AST 15  ALT 13  ALKPHOS 82  BILITOT 0.5  PROT 7.1  ALBUMIN 3.6   Recent Labs  Lab 10/12/22 1925  LIPASE 45   No results for input(s): "AMMONIA" in the last 168 hours. Coagulation Profile: No results for input(s): "INR", "PROTIME" in the last 168 hours. Cardiac  Enzymes: No results for input(s): "CKTOTAL", "CKMB", "CKMBINDEX", "TROPONINI" in the last 168 hours. BNP (last 3 results) No results for input(s): "PROBNP" in the last 8760 hours. HbA1C: No results for input(s): "HGBA1C" in the last 72 hours. CBG: No results for input(s): "GLUCAP" in the last 168 hours. Lipid Profile: No results for input(s): "CHOL", "HDL", "LDLCALC", "TRIG", "CHOLHDL", "LDLDIRECT" in the last 72 hours. Thyroid Function Tests: No results for  input(s): "TSH", "T4TOTAL", "FREET4", "T3FREE", "THYROIDAB" in the last 72 hours. Anemia Panel: No results for input(s): "VITAMINB12", "FOLATE", "FERRITIN", "TIBC", "IRON", "RETICCTPCT" in the last 72 hours. Urine analysis:    Component Value Date/Time   COLORURINE YELLOW 09/25/2022 1103   APPEARANCEUR CLOUDY (A) 09/25/2022 1103   LABSPEC 1.019 09/25/2022 1103   PHURINE 5.0 09/25/2022 1103   GLUCOSEU NEGATIVE 09/25/2022 1103   HGBUR SMALL (A) 09/25/2022 1103   BILIRUBINUR NEGATIVE 09/25/2022 1103   KETONESUR NEGATIVE 09/25/2022 1103   PROTEINUR 100 (A) 09/25/2022 1103   NITRITE NEGATIVE 09/25/2022 1103   LEUKOCYTESUR SMALL (A) 09/25/2022 1103    Radiological Exams on Admission: CT ABDOMEN PELVIS WO CONTRAST  Result Date: 10/12/2022 CLINICAL DATA:  Abdominal pain. EXAM: CT ABDOMEN AND PELVIS WITHOUT CONTRAST TECHNIQUE: Multidetector CT imaging of the abdomen and pelvis was performed following the standard protocol without IV contrast. RADIATION DOSE REDUCTION: This exam was performed according to the departmental dose-optimization program which includes automated exposure control, adjustment of the mA and/or kV according to patient size and/or use of iterative reconstruction technique. COMPARISON:  October 31, 2021 FINDINGS: Lower chest: No acute abnormality. Hepatobiliary: No focal liver abnormality is seen. Status post cholecystectomy. No biliary dilatation. Pancreas: Unremarkable. No pancreatic ductal dilatation or surrounding  inflammatory changes. Spleen: Normal in size without focal abnormality. Adrenals/Urinary Tract: Adrenal glands are unremarkable. Kidneys are normal, without renal calculi, focal lesion, or hydronephrosis. The urinary bladder is poorly distended and subsequently limited in evaluation. Stomach/Bowel: Stomach is within normal limits. Appendix appears normal. No evidence of bowel wall thickening, distention, or inflammatory changes. Noninflamed diverticula are seen scattered throughout the large bowel. Vascular/Lymphatic: Aortic atherosclerosis. An inferior vena cava filter is noted. No enlarged abdominal or pelvic lymph nodes. Reproductive: Uterus and bilateral adnexa are unremarkable. Other: No abdominal wall hernia or abnormality. No abdominopelvic ascites. Musculoskeletal: No acute or significant osseous findings. IMPRESSION: 1. Colonic diverticulosis. 2. Evidence of prior cholecystectomy. 3. Aortic atherosclerosis. Aortic Atherosclerosis (ICD10-I70.0). Electronically Signed   By: Aram Candela M.D.   On: 10/12/2022 20:28   DG Chest Port 1 View  Result Date: 10/12/2022 CLINICAL DATA:  Chest pain. EXAM: PORTABLE CHEST 1 VIEW COMPARISON:  05/28/2022 FINDINGS: The lungs are clear without focal pneumonia, edema, pneumothorax or pleural effusion. Cardiopericardial silhouette is at upper limits of normal for size. No acute bony abnormality. Lower cervical fusion hardware evident. IMPRESSION: No active disease. Electronically Signed   By: Kennith Center M.D.   On: 10/12/2022 18:30    Assessment and Plan  Nausea, vomiting, generalized abdominal pain Patient presenting with 2-day history of sharp epigastric abdominal pain and nonbloody emesis.  Lipase and LFTs normal.  CT abdomen pelvis negative for acute finding.  Symptoms possibly related to marijuana use but patient states she has not used it for several weeks, will order UDS.  Differential also includes viral gastroenteritis and gastritis.  Start IV Protonix  40 mg every 12 hours, continue pain management, gentle IV fluid hydration, and antiemetic as needed.  COPD Stable, no signs of acute exacerbation.  CAD Troponin mildly elevated but stable and not consistent with ACS.  Patient denies chest pain.  Hypertension Continue amlodipine, Coreg, hydralazine, and Imdur.  Hyperlipidemia History of stroke Continue Lipitor.  CKD stage IV Metabolic acidosis Creatinine at baseline, continue to monitor labs.  DVT prophylaxis: SQ Heparin Code Status: Full Code (discussed with the patient) Family Communication: No family available at this time. Level of care: Med-Surg Admission status: It is my clinical opinion  that referral for OBSERVATION is reasonable and necessary in this patient based on the above information provided. The aforementioned taken together are felt to place the patient at high risk for further clinical deterioration. However, it is anticipated that the patient may be medically stable for discharge from the hospital within 24 to 48 hours.   John Giovanni MD [facility]  If 7PM-7AM, please contact night-coverage [email or web address]  10/13/2022, 4:14 AM

## 2022-10-13 NOTE — Progress Notes (Signed)
New Admission Note:   Arrival Method: Via stretcher from ED Mental Orientation:   A & O x4 Telemetry: Box 5M20 Assessment: Completed Skin:  Dry but intact.  Calluses to bilateral feet IV:  RAFA NSL Pain: C/O Abd pain and nausea Tubes:  N/A Safety Measures: Safety Fall Prevention Plan has been given, discussed and signed Admission: Completed 5 MW Orientation: Patient has been orientated to the room, unit and staff.  Family:  No Family at Bedside  Patient is complaining of dizziness and her gait is unsteady.  She uses a four wheel walker at home.  She understands that she is considered a High Fall Risk while here.  Bed Alarm activated.  Also, she wears glasses but did not bring to the hospital with her.  She has no teeth and does not have dentures.  She was advised about the valuables policy.  She has a necklace, cell phoine, shoes, night gown, and keys.  Orders have been reviewed and implemented. Will continue to monitor the patient. Call light has been placed within reach and bed alarm has been activated.   Bernie Covey RN Phone number: [phone]

## 2022-10-13 NOTE — Progress Notes (Signed)
Colleen Curtis is a 57 y.o. 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  CT of the abdomen pelvis negative for any acute findings and she was admitted for possible gastroenteritis/gastritis versus hyperemesis cannabis syndrome.  She continues to have ongoing nausea and poor oral intake.  Patient seen and evaluated at bedside and continues to have ongoing symptoms.  She has been admitted after midnight.  Plan to follow-up lab work and continue close monitoring.  Anticipate discharge in the next 24-48 hours once symptomatically improved.  Total care time: 25 minutes.

## 2022-10-14 LAB — COMPREHENSIVE METABOLIC PANEL
ALT: 13 U/L (ref 0–44)
AST: 14 U/L — ABNORMAL LOW (ref 15–41)
Albumin: 3.1 g/dL — ABNORMAL LOW (ref 3.5–5.0)
Alkaline Phosphatase: 67 U/L (ref 38–126)
Anion gap: 15 (ref 5–15)
BUN: 42 mg/dL — ABNORMAL HIGH (ref 6–20)
CO2: 16 mmol/L — ABNORMAL LOW (ref 22–32)
Calcium: 8.6 mg/dL — ABNORMAL LOW (ref 8.9–10.3)
Chloride: 106 mmol/L (ref 98–111)
Creatinine, Ser: 3.65 mg/dL — ABNORMAL HIGH (ref 0.44–1.00)
GFR, Estimated: 14 mL/min — ABNORMAL LOW (ref >60)
Glucose, Bld: 102 mg/dL — ABNORMAL HIGH (ref 70–99)
Potassium: 4 mmol/L (ref 3.5–5.1)
Sodium: 137 mmol/L (ref 135–145)
Total Bilirubin: 0.8 mg/dL (ref 0.3–1.2)
Total Protein: 6 g/dL — ABNORMAL LOW (ref 6.5–8.1)

## 2022-10-14 LAB — CBC
HCT: 38.5 % (ref 36.0–46.0)
Hemoglobin: 12.3 g/dL (ref 12.0–15.0)
MCH: 26.6 pg (ref 26.0–34.0)
MCHC: 31.9 g/dL (ref 30.0–36.0)
MCV: 83.2 fL (ref 80.0–100.0)
Platelets: 258 10*3/uL (ref 150–400)
RBC: 4.63 MIL/uL (ref 3.87–5.11)
RDW: 14.6 % (ref 11.5–15.5)
WBC: 8.7 10*3/uL (ref 4.0–10.5)
nRBC: 0 % (ref 0.0–0.2)

## 2022-10-14 LAB — MAGNESIUM: Magnesium: 1.9 mg/dL (ref 1.7–2.4)

## 2022-10-14 MED ORDER — LACTATED RINGERS IV SOLN: INTRAVENOUS | Status: AC

## 2022-10-14 MED ORDER — MECLIZINE HCL 25 MG PO TABS
12.5000 mg | ORAL_TABLET | Freq: Three times a day (TID) | ORAL | Status: DC | PRN
  Administered 2022-10-14 (×2): 12.5 mg via ORAL
  Filled 2022-10-14 (×2): qty 1

## 2022-10-14 MED ORDER — PANTOPRAZOLE SODIUM 40 MG PO TBEC
40.0000 mg | DELAYED_RELEASE_TABLET | Freq: Two times a day (BID) | ORAL | Status: DC
  Administered 2022-10-15 (×2): 40 mg via ORAL
  Filled 2022-10-14 (×2): qty 1

## 2022-10-14 NOTE — Progress Notes (Signed)
PROGRESS NOTE    Colleen Curtis  WUJ:[id number] DOB: Jul 30, 1965 DOA: 10/12/2022 PCP: Oneita Hurt, No   Brief Narrative:    Colleen Curtis is a 57 y.o. 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  CT of the abdomen pelvis negative for any acute findings and she was admitted for possible gastroenteritis/gastritis versus hyperemesis cannabis syndrome.  She continues to have ongoing nausea and poor oral intake as well as dizziness.  Assessment & Plan:   Principal Problem:   Intractable nausea and vomiting Active Problems:   Hyperlipidemia   Hypertension   CKD (chronic kidney disease), stage IV (HCC)   CAD (coronary artery disease)  Assessment and Plan:   Nausea, vomiting, generalized abdominal pain Patient presenting with 2-day history of sharp epigastric abdominal pain and nonbloody emesis.  Lipase and LFTs normal.  CT abdomen pelvis negative for acute finding.  Symptoms possibly related to marijuana use but patient states she has not used it for several weeks, will order UDS.  Differential also includes viral gastroenteritis and gastritis.  Start IV Protonix 40 mg every 12 hours, continue pain management, gentle IV fluid hydration, and antiemetic as needed. -Continue gentle IV fluid through today  Dizziness Started on meclizine 7/8   COPD Stable, no signs of acute exacerbation.   CAD Troponin mildly elevated but stable and not consistent with ACS.  Patient denies chest pain.   Hypertension Continue amlodipine, Coreg, hydralazine, and Imdur.   Hyperlipidemia History of stroke Continue Lipitor.   CKD stage IV Metabolic acidosis Creatinine at baseline, continue to monitor labs. Started on gentle IV  fluid for 12 hours and repeat labs in a.m.   Obesity BMI 39.45   DVT prophylaxis:Heparin Code Status: Full Family Communication: None at bedside Disposition Plan:  Status is: Observation The patient will require care spanning > 2 midnights and should be moved to inpatient because: Need for IVF.   Consultants:  None  Procedures:  None  Antimicrobials:  None   Subjective: Patient seen and evaluated today with ongoing nausea, but appetite is slowly improving and no further vomiting.  She is noted to have some significant dizziness this morning.  Objective: Vitals:   10/13/22 1446 10/13/22 2131 10/14/22 0505 10/14/22 0810  BP: 129/89 119/77 (!) 146/98 132/77  Pulse: 66 72 75 65  Resp: 16 19 16 18   Temp: 98.4 F (36.9 C) 98.4 F (36.9 C) 98.4 F (36.9 C) 98.2 F (36.8 C)  TempSrc:  Oral Oral   SpO2: 97% 95% 98% 98%  Weight:  101 kg    Height:        Intake/Output Summary (Last 24 hours) at 10/14/2022 1049 Last data filed at 10/14/2022 0800 Gross per 24 hour  Intake 820 ml  Output 1050 ml  Net -230 ml   Filed Weights   10/13/22 0110 10/13/22 2131  Weight: 99.9 kg 101 kg    Examination:  General exam: Appears calm and comfortable, obese Respiratory system: Clear to auscultation. Respiratory effort normal. Cardiovascular system: S1 & S2 heard, RRR.  Gastrointestinal system: Abdomen is soft Central nervous system: Alert and awake Extremities: No edema Skin: No significant lesions noted Psychiatry: Flat affect.    Data Reviewed: I have personally reviewed following labs and imaging studies  CBC: Recent Labs  Lab 10/12/22 1925 10/12/22 1947 10/13/22  0700 10/14/22 0841  WBC 13.8*  --  11.7* 8.7  NEUTROABS 10.7*  --   --   --   HGB 13.7 14.6 13.0 12.3  HCT 42.5 43.0 39.8 38.5  MCV 80.8  --  80.7 83.2  PLT 320  --  273 258   Basic Metabolic Panel: Recent Labs  Lab 10/12/22 1925 10/12/22 1947 10/13/22 0700 10/14/22 0841  NA 138 142 137 137  K  4.0 4.0 3.8 4.0  CL 110 114* 111 106  CO2 15*  --  17* 16*  GLUCOSE 111* 109* 92 102*  BUN 43* 40* 41* 42*  CREATININE 3.43* 3.70* 3.39* 3.65*  CALCIUM 8.8*  --  8.5* 8.6*  MG  --   --   --  1.9   GFR: Estimated Creatinine Clearance: 18.8 mL/min (A) (by C-G formula based on SCr of 3.65 mg/dL (H)). Liver Function Tests: Recent Labs  Lab 10/12/22 1925 10/14/22 0841  AST 15 14*  ALT 13 13  ALKPHOS 82 67  BILITOT 0.5 0.8  PROT 7.1 6.0*  ALBUMIN 3.6 3.1*   Recent Labs  Lab 10/12/22 1925  LIPASE 45   No results for input(s): "AMMONIA" in the last 168 hours. Coagulation Profile: No results for input(s): "INR", "PROTIME" in the last 168 hours. Cardiac Enzymes: No results for input(s): "CKTOTAL", "CKMB", "CKMBINDEX", "TROPONINI" in the last 168 hours. BNP (last 3 results) No results for input(s): "PROBNP" in the last 8760 hours. HbA1C: No results for input(s): "HGBA1C" in the last 72 hours. CBG: No results for input(s): "GLUCAP" in the last 168 hours. Lipid Profile: No results for input(s): "CHOL", "HDL", "LDLCALC", "TRIG", "CHOLHDL", "LDLDIRECT" in the last 72 hours. Thyroid Function Tests: No results for input(s): "TSH", "T4TOTAL", "FREET4", "T3FREE", "THYROIDAB" in the last 72 hours. Anemia Panel: No results for input(s): "VITAMINB12", "FOLATE", "FERRITIN", "TIBC", "IRON", "RETICCTPCT" in the last 72 hours. Sepsis Labs: No results for input(s): "PROCALCITON", "LATICACIDVEN" in the last 168 hours.  No results found for this or any previous visit (from the past 240 hour(s)).       Radiology Studies: CT ABDOMEN PELVIS WO CONTRAST  Result Date: 10/12/2022 CLINICAL DATA:  Abdominal pain. EXAM: CT ABDOMEN AND PELVIS WITHOUT CONTRAST TECHNIQUE: Multidetector CT imaging of the abdomen and pelvis was performed following the standard protocol without IV contrast. RADIATION DOSE REDUCTION: This exam was performed according to the departmental dose-optimization program which  includes automated exposure control, adjustment of the mA and/or kV according to patient size and/or use of iterative reconstruction technique. COMPARISON:  October 31, 2021 FINDINGS: Lower chest: No acute abnormality. Hepatobiliary: No focal liver abnormality is seen. Status post cholecystectomy. No biliary dilatation. Pancreas: Unremarkable. No pancreatic ductal dilatation or surrounding inflammatory changes. Spleen: Normal in size without focal abnormality. Adrenals/Urinary Tract: Adrenal glands are unremarkable. Kidneys are normal, without renal calculi, focal lesion, or hydronephrosis. The urinary bladder is poorly distended and subsequently limited in evaluation. Stomach/Bowel: Stomach is within normal limits. Appendix appears normal. No evidence of bowel wall thickening, distention, or inflammatory changes. Noninflamed diverticula are seen scattered throughout the large bowel. Vascular/Lymphatic: Aortic atherosclerosis. An inferior vena cava filter is noted. No enlarged abdominal or pelvic lymph nodes. Reproductive: Uterus and bilateral adnexa are unremarkable. Other: No abdominal wall hernia or abnormality. No abdominopelvic ascites. Musculoskeletal: No acute or significant osseous findings. IMPRESSION: 1. Colonic diverticulosis. 2. Evidence of prior cholecystectomy. 3. Aortic atherosclerosis. Aortic Atherosclerosis (ICD10-I70.0). Electronically Signed   By: Aram Candela M.D.   On: 10/12/2022  20:28   DG Chest Port 1 View  Result Date: 10/12/2022 CLINICAL DATA:  Chest pain. EXAM: PORTABLE CHEST 1 VIEW COMPARISON:  05/28/2022 FINDINGS: The lungs are clear without focal pneumonia, edema, pneumothorax or pleural effusion. Cardiopericardial silhouette is at upper limits of normal for size. No acute bony abnormality. Lower cervical fusion hardware evident. IMPRESSION: No active disease. Electronically Signed   By: Kennith Center M.D.   On: 10/12/2022 18:30        Scheduled Meds:  amLODipine  5 mg Oral  Daily   aspirin EC  81 mg Oral Daily   atorvastatin  80 mg Oral QHS   carvedilol  3.125 mg Oral BID WC   heparin  5,000 Units Subcutaneous Q8H   hydrALAZINE  25 mg Oral BID   isosorbide mononitrate  60 mg Oral Daily   pantoprazole (PROTONIX) IV  40 mg Intravenous Q12H   Continuous Infusions:  lactated ringers       LOS: 0 days    Time spent: 35 minutes    Colleen Craigo Hoover Brunette, DO [facility]  If 7PM-7AM, please contact night-coverage [email or web address] 10/14/2022, 10:49 AM

## 2022-10-14 NOTE — Plan of Care (Signed)
  Problem: Clinical Measurements: Goal: Respiratory complications will improve Outcome: Progressing   Problem: Nutrition: Goal: Adequate nutrition will be maintained Outcome: Progressing   Problem: Elimination: Goal: Will not experience complications related to urinary retention Outcome: Progressing   

## 2022-10-15 LAB — BASIC METABOLIC PANEL
Anion gap: 10 (ref 5–15)
BUN: 40 mg/dL — ABNORMAL HIGH (ref 6–20)
CO2: 15 mmol/L — ABNORMAL LOW (ref 22–32)
Calcium: 8.6 mg/dL — ABNORMAL LOW (ref 8.9–10.3)
Chloride: 112 mmol/L — ABNORMAL HIGH (ref 98–111)
Creatinine, Ser: 3.89 mg/dL — ABNORMAL HIGH (ref 0.44–1.00)
GFR, Estimated: 13 mL/min — ABNORMAL LOW (ref >60)
Glucose, Bld: 91 mg/dL (ref 70–99)
Potassium: 4.1 mmol/L (ref 3.5–5.1)
Sodium: 137 mmol/L (ref 135–145)

## 2022-10-15 LAB — CBC
HCT: 38.6 % (ref 36.0–46.0)
Hemoglobin: 12.3 g/dL (ref 12.0–15.0)
MCH: 26.1 pg (ref 26.0–34.0)
MCHC: 31.9 g/dL (ref 30.0–36.0)
MCV: 81.8 fL (ref 80.0–100.0)
Platelets: 276 10*3/uL (ref 150–400)
RBC: 4.72 MIL/uL (ref 3.87–5.11)
RDW: 14.6 % (ref 11.5–15.5)
WBC: 8.7 10*3/uL (ref 4.0–10.5)
nRBC: 0 % (ref 0.0–0.2)

## 2022-10-15 LAB — MAGNESIUM: Magnesium: 1.9 mg/dL (ref 1.7–2.4)

## 2022-10-15 MED ORDER — ONDANSETRON HCL 4 MG PO TABS: 4.0000 mg | ORAL_TABLET | Freq: Every day | ORAL | 1 refills | Status: DC | PRN

## 2022-10-15 MED ORDER — MECLIZINE HCL 12.5 MG PO TABS: 12.5000 mg | ORAL_TABLET | Freq: Three times a day (TID) | ORAL | 0 refills | Status: DC | PRN

## 2022-10-15 NOTE — Discharge Summary (Signed)
Physician Discharge Summary  Colleen Curtis ZOX:[id number] DOB: [birth year] DOA: 10/12/2022  PCP: Pcp, No  Admit date: 10/12/2022  Discharge date: 10/15/2022  Admitted From:Home  Disposition:  Home  Recommendations for Outpatient Follow-up:  Follow up with PCP in 1-2 weeks Zofran prescribed as needed for nausea or vomiting Meclizine prescribed as needed for any dizziness Continue other home medications as prior  Home Health: None  Equipment/Devices: None  Discharge Condition:Stable  CODE STATUS: Full  Diet recommendation: Heart Healthy  Brief/Interim Summary:  Colleen Curtis is a 57 y.o. 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  CT of the abdomen pelvis negative for any acute findings and she was admitted for possible gastroenteritis/gastritis versus hyperemesis cannabis syndrome.  She was noted to have ongoing symptoms of nausea and vomiting as well as some dizziness which has now improved over time.  She did require the use of some meclizine to assist with her symptoms.  No other acute events or concerns noted throughout the course of this admission and she is now stable for discharge.  She has been counseled on reducing her use of cannabis.  Discharge Diagnoses:  Principal Problem:   Intractable nausea and vomiting Active Problems:   Hyperlipidemia   Hypertension   CKD (chronic kidney disease), stage IV (HCC)   CAD (coronary artery disease)  Principal discharge diagnosis: Intractable nausea and vomiting possibly related to cannabis hyperemesis syndrome-resolved.  Discharge Instructions  Discharge Instructions     Diet - low sodium heart healthy   Complete by: As directed    Increase  activity slowly   Complete by: As directed       Allergies as of 10/15/2022   No Known Allergies      Medication List     TAKE these medications    acetaminophen 325 MG tablet Commonly known as: TYLENOL Take 2 tablets (650 mg total) by mouth every 4 (four) hours as needed for headache or mild pain. What changed:  how much to take when to take this   amLODipine 5 MG tablet Commonly known as: NORVASC Take 1 tablet (5 mg total) by mouth daily.   aspirin EC 81 MG tablet Take 1 tablet (81 mg total) by mouth daily. Swallow whole.   atorvastatin 80 MG tablet Commonly known as: LIPITOR Take 1 tablet (80 mg total) by mouth at bedtime.   carvedilol 3.125 MG tablet Commonly known as: COREG Take 1 tablet (3.125 mg total) by mouth 2 (two) times daily with a meal.   clopidogrel 75 MG tablet Commonly known as: PLAVIX Take 1 tablet (75 mg total) by mouth daily.   hydrALAZINE 25 MG tablet Commonly known as: APRESOLINE Take 1 tablet (25 mg total) by mouth 2 (two) times daily.   isosorbide mononitrate 60 MG 24 hr tablet Commonly known as: IMDUR Take 1 tablet (60 mg total) by mouth daily.   meclizine 12.5 MG tablet Commonly known as: ANTIVERT Take 1 tablet (12.5 mg total) by mouth 3 (three) times daily as needed for dizziness.   ondansetron 4 MG tablet Commonly known as: Zofran Take 1 tablet (4 mg total) by mouth daily as needed for nausea or vomiting.        No Known Allergies  Consultations: None   Procedures/Studies: CT ABDOMEN PELVIS WO CONTRAST  Result Date:  10/12/2022 CLINICAL DATA:  Abdominal pain. EXAM: CT ABDOMEN AND PELVIS WITHOUT CONTRAST TECHNIQUE: Multidetector CT imaging of the abdomen and pelvis was performed following the standard protocol without IV contrast. RADIATION DOSE REDUCTION: This exam was performed according to the departmental dose-optimization program which includes automated exposure control, adjustment of the mA and/or kV according to  patient size and/or use of iterative reconstruction technique. COMPARISON:  October 31, 2021 FINDINGS: Lower chest: No acute abnormality. Hepatobiliary: No focal liver abnormality is seen. Status post cholecystectomy. No biliary dilatation. Pancreas: Unremarkable. No pancreatic ductal dilatation or surrounding inflammatory changes. Spleen: Normal in size without focal abnormality. Adrenals/Urinary Tract: Adrenal glands are unremarkable. Kidneys are normal, without renal calculi, focal lesion, or hydronephrosis. The urinary bladder is poorly distended and subsequently limited in evaluation. Stomach/Bowel: Stomach is within normal limits. Appendix appears normal. No evidence of bowel wall thickening, distention, or inflammatory changes. Noninflamed diverticula are seen scattered throughout the large bowel. Vascular/Lymphatic: Aortic atherosclerosis. An inferior vena cava filter is noted. No enlarged abdominal or pelvic lymph nodes. Reproductive: Uterus and bilateral adnexa are unremarkable. Other: No abdominal wall hernia or abnormality. No abdominopelvic ascites. Musculoskeletal: No acute or significant osseous findings. IMPRESSION: 1. Colonic diverticulosis. 2. Evidence of prior cholecystectomy. 3. Aortic atherosclerosis. Aortic Atherosclerosis (ICD10-I70.0). Electronically Signed   By: Aram Candela M.D.   On: 10/12/2022 20:28   DG Chest Port 1 View  Result Date: 10/12/2022 CLINICAL DATA:  Chest pain. EXAM: PORTABLE CHEST 1 VIEW COMPARISON:  05/28/2022 FINDINGS: The lungs are clear without focal pneumonia, edema, pneumothorax or pleural effusion. Cardiopericardial silhouette is at upper limits of normal for size. No acute bony abnormality. Lower cervical fusion hardware evident. IMPRESSION: No active disease. Electronically Signed   By: Kennith Center M.D.   On: 10/12/2022 18:30   MR BRAIN WO CONTRAST  Result Date: 09/25/2022 CLINICAL DATA:  Neuro deficit, acute, stroke suspected. EXAM: MRI HEAD WITHOUT  CONTRAST MRA HEAD WITHOUT CONTRAST TECHNIQUE: Multiplanar, multi-echo pulse sequences of the brain and surrounding structures were acquired without intravenous contrast. Angiographic images of the Circle of Willis were acquired using MRA technique without intravenous contrast. COMPARISON:  Head CT 09/25/2022. MRI brain 07/22/2022. MRA head 07/23/2022. FINDINGS: MRI HEAD FINDINGS Brain: No acute infarct or hemorrhage. Unchanged severe chronic small-vessel disease with old lacunar infarcts in the right thalamus left midbrain and left caudate. No hydrocephalus or extra-axial collection. No mass or midline shift. No abnormal susceptibility. Vascular: Normal flow voids. Skull and upper cervical spine: Normal marrow signal. Sinuses/Orbits: Unremarkable. Other: None. 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 Mild atherosclerotic narrowing of the cavernous and clinoid segments of the right ICA. The proximal ACAs and MCAs are patent without stenosis or aneurysm. Distal branches are symmetric. Posterior circulation: Visualized portions of the distal vertebral arteries and basilar artery are patent without stenosis or aneurysm. 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 Unchanged moderate stenosis of the proximal left P1 segment with diminished flow related enhancement in the left P2 branches, likely secondary to moderate-to-severe stenoses. Anatomic variants: None. IMPRESSION: 1. No acute intracranial process. 2. Unchanged severe chronic small-vessel disease with old lacunar infarcts in the right thalamus, left midbrain and left caudate. 3. Unchanged moderate stenosis of the proximal left P1 segment with diminished flow related enhancement in the left P2 branches, likely secondary to moderate-to-severe stenoses. 4. 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 5. 2 mm laterally projecting aneurysm arising from  the cavernous  segment of the left ICA. Electronically Signed   By: Orvan Falconer M.D.   On: 09/25/2022 11:25   MR ANGIO HEAD WO CONTRAST  Result Date: 09/25/2022 CLINICAL DATA:  Neuro deficit, acute, stroke suspected. EXAM: MRI HEAD WITHOUT CONTRAST MRA HEAD WITHOUT CONTRAST TECHNIQUE: Multiplanar, multi-echo pulse sequences of the brain and surrounding structures were acquired without intravenous contrast. Angiographic images of the Circle of Willis were acquired using MRA technique without intravenous contrast. COMPARISON:  Head CT 09/25/2022. MRI brain 07/22/2022. MRA head 07/23/2022. FINDINGS: MRI HEAD FINDINGS Brain: No acute infarct or hemorrhage. Unchanged severe chronic small-vessel disease with old lacunar infarcts in the right thalamus left midbrain and left caudate. No hydrocephalus or extra-axial collection. No mass or midline shift. No abnormal susceptibility. Vascular: Normal flow voids. Skull and upper cervical spine: Normal marrow signal. Sinuses/Orbits: Unremarkable. Other: None. 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 Mild atherosclerotic narrowing of the cavernous and clinoid segments of the right ICA. The proximal ACAs and MCAs are patent without stenosis or aneurysm. Distal branches are symmetric. Posterior circulation: Visualized portions of the distal vertebral arteries and basilar artery are patent without stenosis or aneurysm. 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 Unchanged moderate stenosis of the proximal left P1 segment with diminished flow related enhancement in the left P2 branches, likely secondary to moderate-to-severe stenoses. Anatomic variants: None. IMPRESSION: 1. No acute intracranial process. 2. Unchanged severe chronic small-vessel disease with old lacunar infarcts in the right thalamus, left midbrain and left caudate. 3. Unchanged moderate stenosis of the proximal left P1 segment with  diminished flow related enhancement in the left P2 branches, likely secondary to moderate-to-severe stenoses. 4. 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 5. 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 Electronically Signed   By: Orvan Falconer M.D.   On: 09/25/2022 11:25   CT HEAD CODE STROKE WO CONTRAST  Result Date: 09/25/2022 CLINICAL DATA:  Code stroke.  Left-sided weakness. EXAM: CT HEAD WITHOUT CONTRAST TECHNIQUE: Contiguous axial images were obtained from the base of the skull through the vertex without intravenous contrast. RADIATION DOSE REDUCTION: This exam was performed according to the departmental dose-optimization program which includes automated exposure control, adjustment of the mA and/or kV according to patient size and/or use of iterative reconstruction technique. COMPARISON:  MR head 07/23/2022 FINDINGS: Brain: There is no evidence of acute intracranial hemorrhage, extra-axial fluid collection, or acute territorial infarct. Parenchymal volume is stable. The ventricles are stable in size. Confluent hypodensity in the supratentorial white matter likely reflects sequela of advanced chronic small-vessel ischemic change, stable. Small remote lacunar infarcts are seen in the bilateral basal ganglia and thalami, unchanged. The pituitary and suprasellar region are normal. There is no mass lesion. There is no mass effect or midline shift. Vascular: There is calcification of the bilateral carotid siphons. Skull: Normal. Negative for fracture or focal lesion. Sinuses/Orbits: The paranasal sinuses are clear. The globes and orbits are unremarkable. Other: None. ASPECTS [facility] Stroke Program Early CT Score) - Ganglionic level infarction (caudate, lentiform nuclei, internal capsule, insula, M1-M3 cortex): Seven - Supraganglionic infarction (M4-M6 cortex): 3 Total score (0-10 with 10 being normal): 10 IMPRESSION: No acute intracranial pathology.  Findings communicated to Dr Iver Nestle at 10:28 am. Electronically Signed   By: Lesia Hausen M.D.   On: 09/25/2022 10:28     Discharge Exam: Vitals:  10/15/22 0332 10/15/22 0916  BP: 108/68 133/85  Pulse: 67 64  Resp: 17 16  Temp: 98.1 F (36.7 C) 98.4 F (36.9 C)  SpO2: 99% 100%   Vitals:   10/14/22 1509 10/14/22 2024 10/15/22 0332 10/15/22 0916  BP: 109/79 107/62 108/68 133/85  Pulse: 69  67 64  Resp: 16 17 17 16   Temp: 98.3 F (36.8 C) 99.1 F (37.3 C) 98.1 F (36.7 C) 98.4 F (36.9 C)  TempSrc:  Oral Oral   SpO2: 100% 98% 99% 100%  Weight:      Height:        General: Pt is alert, awake, not in acute distress Cardiovascular: RRR, S1/S2 +, no rubs, no gallops Respiratory: CTA bilaterally, no wheezing, no rhonchi Abdominal: Soft, NT, ND, bowel sounds + Extremities: no edema, no cyanosis    The results of significant diagnostics from this hospitalization (including imaging, microbiology, ancillary and laboratory) are listed below for reference.     Microbiology: No results found for this or any previous visit (from the past 240 hour(s)).   Labs: BNP (last 3 results) Recent Labs    06/05/22 2344  BNP 385.9*   Basic Metabolic Panel: Recent Labs  Lab 10/12/22 1925 10/12/22 1947 10/13/22 0700 10/14/22 0841 10/15/22 0731  NA 138 142 137 137 137  K 4.0 4.0 3.8 4.0 4.1  CL 110 114* 111 106 112*  CO2 15*  --  17* 16* 15*  GLUCOSE 111* 109* 92 102* 91  BUN 43* 40* 41* 42* 40*  CREATININE 3.43* 3.70* 3.39* 3.65* 3.89*  CALCIUM 8.8*  --  8.5* 8.6* 8.6*  MG  --   --   --  1.9 1.9   Liver Function Tests: Recent Labs  Lab 10/12/22 1925 10/14/22 0841  AST 15 14*  ALT 13 13  ALKPHOS 82 67  BILITOT 0.5 0.8  PROT 7.1 6.0*  ALBUMIN 3.6 3.1*   Recent Labs  Lab 10/12/22 1925  LIPASE 45   No results for input(s): "AMMONIA" in the last 168 hours. CBC: Recent Labs  Lab 10/12/22 1925 10/12/22 1947 10/13/22 0700 10/14/22 0841 10/15/22 0731  WBC 13.8*   --  11.7* 8.7 8.7  NEUTROABS 10.7*  --   --   --   --   HGB 13.7 14.6 13.0 12.3 12.3  HCT 42.5 43.0 39.8 38.5 38.6  MCV 80.8  --  80.7 83.2 81.8  PLT 320  --  273 258 276   Cardiac Enzymes: No results for input(s): "CKTOTAL", "CKMB", "CKMBINDEX", "TROPONINI" in the last 168 hours. BNP: Invalid input(s): "POCBNP" CBG: No results for input(s): "GLUCAP" in the last 168 hours. D-Dimer No results for input(s): "DDIMER" in the last 72 hours. Hgb A1c No results for input(s): "HGBA1C" in the last 72 hours. Lipid Profile No results for input(s): "CHOL", "HDL", "LDLCALC", "TRIG", "CHOLHDL", "LDLDIRECT" in the last 72 hours. Thyroid function studies No results for input(s): "TSH", "T4TOTAL", "T3FREE", "THYROIDAB" in the last 72 hours.  Invalid input(s): "FREET3" Anemia work up No results for input(s): "VITAMINB12", "FOLATE", "FERRITIN", "TIBC", "IRON", "RETICCTPCT" in the last 72 hours. Urinalysis    Component Value Date/Time   COLORURINE YELLOW 09/25/2022 1103   APPEARANCEUR CLOUDY (A) 09/25/2022 1103   LABSPEC 1.019 09/25/2022 1103   PHURINE 5.0 09/25/2022 1103   GLUCOSEU NEGATIVE 09/25/2022 1103   HGBUR SMALL (A) 09/25/2022 1103   BILIRUBINUR NEGATIVE 09/25/2022 1103   KETONESUR NEGATIVE 09/25/2022 1103   PROTEINUR 100 (A) 09/25/2022 1103  NITRITE NEGATIVE 09/25/2022 1103   LEUKOCYTESUR SMALL (A) 09/25/2022 1103   Sepsis Labs Recent Labs  Lab 10/12/22 1925 10/13/22 0700 10/14/22 0841 10/15/22 0731  WBC 13.8* 11.7* 8.7 8.7   Microbiology No results found for this or any previous visit (from the past 240 hour(s)).   Time coordinating discharge: 35 minutes  SIGNED:   Erick Blinks, DO [facility] 10/15/2022, 10:07 AM  If 7PM-7AM, please contact night-coverage [email or web address]

## 2022-10-15 NOTE — Progress Notes (Signed)
DISCHARGE NOTE HOME Beryl Meager Detwiler to be discharged Home per MD order. Diagnosis, treatment, and discharge instructions explained. Discussed prescriptions Prescriptions and medication list explained in detail. Patient verbalized knowledge and an understanding.  Skin clean, dry and intact without evidence of skin break down, no evidence of skin tears noted. IV catheter discontinued intact. Site without signs and symptoms of complications. Dressing and pressure applied. Pt denies pain at the site currently. No complaints noted.  Patient free of lines, drains, and wounds.    Tresa Endo, RN

## 2022-10-15 NOTE — Progress Notes (Signed)
Patient refused lab draw around 0400. RN told lab tech to come back around 0600.

## 2022-11-10 ENCOUNTER — Encounter (HOSPITAL_COMMUNITY): Payer: Self-pay

## 2022-11-10 ENCOUNTER — Other Ambulatory Visit: Payer: Self-pay

## 2022-11-10 ENCOUNTER — Emergency Department (HOSPITAL_COMMUNITY): Payer: Self-pay

## 2022-11-10 ENCOUNTER — Observation Stay (HOSPITAL_COMMUNITY)
Admission: EM | Admit: 2022-11-10 | Discharge: 2022-11-13 | Disposition: A | Discharge status: Home / Self Care | Payer: Self-pay | Attending: Cardiovascular Disease | Admitting: Cardiovascular Disease

## 2022-11-10 DIAGNOSIS — R079 Chest pain, unspecified: Principal | ICD-10-CM | POA: Insufficient documentation

## 2022-11-10 DIAGNOSIS — Z7902 Long term (current) use of antithrombotics/antiplatelets: Secondary | ICD-10-CM | POA: Insufficient documentation

## 2022-11-10 DIAGNOSIS — Z955 Presence of coronary angioplasty implant and graft: Secondary | ICD-10-CM | POA: Insufficient documentation

## 2022-11-10 DIAGNOSIS — Z7982 Long term (current) use of aspirin: Secondary | ICD-10-CM | POA: Insufficient documentation

## 2022-11-10 DIAGNOSIS — N189 Chronic kidney disease, unspecified: Secondary | ICD-10-CM

## 2022-11-10 DIAGNOSIS — I129 Hypertensive chronic kidney disease with stage 1 through stage 4 chronic kidney disease, or unspecified chronic kidney disease: Secondary | ICD-10-CM | POA: Insufficient documentation

## 2022-11-10 DIAGNOSIS — Z8673 Personal history of transient ischemic attack (TIA), and cerebral infarction without residual deficits: Secondary | ICD-10-CM | POA: Insufficient documentation

## 2022-11-10 DIAGNOSIS — F17290 Nicotine dependence, other tobacco product, uncomplicated: Secondary | ICD-10-CM | POA: Insufficient documentation

## 2022-11-10 DIAGNOSIS — J449 Chronic obstructive pulmonary disease, unspecified: Secondary | ICD-10-CM | POA: Insufficient documentation

## 2022-11-10 DIAGNOSIS — N184 Chronic kidney disease, stage 4 (severe): Secondary | ICD-10-CM | POA: Insufficient documentation

## 2022-11-10 DIAGNOSIS — Z79899 Other long term (current) drug therapy: Secondary | ICD-10-CM | POA: Insufficient documentation

## 2022-11-10 DIAGNOSIS — I249 Acute ischemic heart disease, unspecified: Secondary | ICD-10-CM | POA: Diagnosis present

## 2022-11-10 DIAGNOSIS — R7989 Other specified abnormal findings of blood chemistry: Secondary | ICD-10-CM | POA: Insufficient documentation

## 2022-11-10 LAB — CBC
HCT: 37.7 % (ref 36.0–46.0)
Hemoglobin: 12 g/dL (ref 12.0–15.0)
MCH: 26.1 pg (ref 26.0–34.0)
MCHC: 31.8 g/dL (ref 30.0–36.0)
MCV: 82 fL (ref 80.0–100.0)
Platelets: 264 10*3/uL (ref 150–400)
RBC: 4.6 MIL/uL (ref 3.87–5.11)
RDW: 14.9 % (ref 11.5–15.5)
WBC: 11.4 10*3/uL — ABNORMAL HIGH (ref 4.0–10.5)
nRBC: 0 % (ref 0.0–0.2)

## 2022-11-10 MED ORDER — MORPHINE SULFATE (PF) 4 MG/ML IV SOLN
4.0000 mg | Freq: Once | INTRAVENOUS | Status: AC
  Administered 2022-11-11: 4 mg via INTRAVENOUS
  Filled 2022-11-10: qty 1

## 2022-11-10 MED ORDER — ONDANSETRON HCL 4 MG/2ML IJ SOLN
4.0000 mg | Freq: Once | INTRAMUSCULAR | Status: AC
  Administered 2022-11-11: 4 mg via INTRAVENOUS
  Filled 2022-11-10: qty 2

## 2022-11-10 NOTE — ED Triage Notes (Signed)
PER EMS: pt is from [employer], states she woke up this morning with sharp, non-radiating constant central chest pain that has progressed throughout the day, associated with nausea that just started about 10 min prior to arrival. Hx of STEMI in 2022 with 3 cardiac stents. Hx of stroke, does not take blood thinners, residual left leg weakness.   EMS adm 324 aspirin and 0.4 mg of sublingual nitroglycerin x 2.   BP- 116/86, HR-78, 100% RA

## 2022-11-10 NOTE — ED Provider Notes (Signed)
Emergency Department Provider Note   I have reviewed the triage vital signs and the nursing notes.   HISTORY  Chief Complaint Chest Pain   HPI Colleen Curtis is a 57 y.o. female with past history of prior NSTEMI, hypertension, stroke, COPD presents to the emergency department acute onset sharp left chest pain.  Patient describes pain radiating to her back.  She states this is how her heart attack felt in 2022.  At that time she had a STEMI with multiple stents placed.  Patient was at work and had acute onset symptoms.  No shortness of breath, nausea/vomiting.   {**SYMPTOM/COMPLAINT  LOCATION (describe anatomically) DURATION (when did it start) TIMING (onset and pattern) SEVERITY (0-10, mild/moderate/severe) QUALITY (description of symptoms) CONTEXT (recent surgery, new meds, activity, etc.) MODIFYINGFACTORS (what makes it better/worse) ASSOCIATEDSYMPTOMS (pertinent positives and negatives)**}  Past Medical History:  Diagnosis Date   Bipolar disorder (HCC)    Cancer (HCC)    COPD (chronic obstructive pulmonary disease) (HCC)    Coronary artery disease    Diverticulitis    Hypertension    Myocardial infarction (HCC)    Seizure (HCC)    Stroke (HCC)     Review of Systems {** Revise as appropriate then delete this line - Documentation of 10 systems OR 2 systems and "10-point ROS otherwise negative" is required **}Constitutional: No fever/chills Eyes: No visual changes. ENT: No sore throat. Cardiovascular: Denies chest pain. Respiratory: Denies shortness of breath. Gastrointestinal: No abdominal pain.  No nausea, no vomiting.  No diarrhea.  No constipation. Genitourinary: Negative for dysuria. Musculoskeletal: Negative for back pain. Skin: Negative for rash. Neurological: Negative for headaches, focal weakness or numbness. {**Psychiatric:  Endocrine:  Hematological/Lymphatic:  Allergic/Immunilogical:  **}  ____________________________________________   PHYSICAL EXAM:  VITAL SIGNS: ED Triage Vitals  Encounter Vitals Group     BP 11/10/22 2200 117/83     Systolic BP Percentile --      Diastolic BP Percentile --      Pulse Rate 11/10/22 2200 77     Resp 11/10/22 2200 16     Temp 11/10/22 2200 98.2 F (36.8 C)     Temp src --      SpO2 11/10/22 2200 100 %     Weight 11/10/22 2212 222 lb (100.7 kg)     Height 11/10/22 2212 5\' 3"  (1.6 m)     Head Circumference --      Peak Flow --      Pain Score 11/10/22 2212 5     Pain Loc --      Pain Education --      Exclude from Growth Chart --    {** Revise as appropriate then delete this line - 8 systems required **} Constitutional: Alert and oriented. Well appearing and in no acute distress. Eyes: Conjunctivae are normal. PERRL. EOMI. Head: Atraumatic. {**Ears:  Healthy appearing ear canals and TMs bilaterally **}Nose: No congestion/rhinnorhea. Mouth/Throat: Mucous membranes are moist.  Oropharynx non-erythematous. Neck: No stridor.  No meningeal signs.  {**No cervical spine tenderness to palpation.**} Cardiovascular: Normal rate, regular rhythm. Good peripheral circulation. Grossly normal heart sounds.   Respiratory: Normal respiratory effort.  No retractions. Lungs CTAB. Gastrointestinal: Soft and nontender. No distention.  {**Genitourinary:  **}Musculoskeletal: No lower extremity tenderness nor edema. No gross deformities of extremities. Neurologic:  Normal speech and language. No gross focal neurologic deficits are appreciated.  Skin:  Skin is warm, dry and intact. No rash noted. {**Psychiatric: Mood and affect are normal. Speech  and behavior are normal.**}  ____________________________________________   LABS (all labs ordered are listed, but only abnormal results are displayed)  Labs Reviewed  BASIC METABOLIC PANEL  CBC  HEPATIC FUNCTION PANEL  LIPASE, BLOOD  TROPONIN I (HIGH SENSITIVITY)    ____________________________________________  EKG   EKG Interpretation Date/Time:  Sunday November 10 2022 22:59:46 EDT Ventricular Rate:  64 PR Interval:  200 QRS Duration:  108 QT Interval:  448 QTC Calculation: 463 R Axis:   13  Text Interpretation: Sinus rhythm Probable left atrial enlargement Incomplete left bundle branch block Low voltage, precordial leads ST elevation, consider inferior injury Confirmed by Alona Bene [id number]) on 11/10/2022 11:07:26 PM        ____________________________________________  RADIOLOGY  No results found.  ____________________________________________   PROCEDURES  Procedure(s) performed:   Procedures   ____________________________________________   INITIAL IMPRESSION / ASSESSMENT AND PLAN / ED COURSE  Pertinent labs & imaging results that were available during my care of the patient were reviewed by me and considered in my medical decision making (see chart for details).   This patient is Presenting for Evaluation of ***, which {Range:23949} require a range of treatment options, and {MDMcomplaint:23950} a complaint that involves a {MDMlevelrisk:23951} risk of morbidity and mortality.  The Differential Diagnoses include***.  Critical Interventions-    Medications  morphine (PF) 4 MG/ML injection 4 mg (has no administration in time range)  ondansetron (ZOFRAN) injection 4 mg (has no administration in time range)    Reassessment after intervention:     I *** Additional Historical Information from ***, as the patient is ***.  I decided to review pertinent External Data, and in summary ***.   Clinical Laboratory Tests Ordered, included   Radiologic Tests Ordered, included ***. I independently interpreted the images and agree with radiology interpretation.   Cardiac Monitor Tracing which shows ***   Social Determinants of Health Risk ***  Consult complete with  Medical Decision Making: Summary: ***  Reevaluation  with update and discussion with   ***Considered admission***  Patient's presentation is most consistent with {EM COPA:27473}   Disposition:   ____________________________________________  FINAL CLINICAL IMPRESSION(S) / ED DIAGNOSES  Final diagnoses:  None     NEW OUTPATIENT MEDICATIONS STARTED DURING THIS VISIT:  New Prescriptions   No medications on file    Note:  This document was prepared using Dragon voice recognition software and may include unintentional dictation errors.  Alona Bene, MD, [facility]

## 2022-11-11 ENCOUNTER — Other Ambulatory Visit: Payer: Self-pay

## 2022-11-11 ENCOUNTER — Emergency Department (HOSPITAL_COMMUNITY): Payer: Self-pay

## 2022-11-11 DIAGNOSIS — R079 Chest pain, unspecified: Secondary | ICD-10-CM

## 2022-11-11 LAB — TROPONIN I (HIGH SENSITIVITY): Troponin I (High Sensitivity): 79 ng/L — ABNORMAL HIGH (ref <18)

## 2022-11-11 MED ORDER — ASPIRIN 81 MG PO TBEC
81.0000 mg | DELAYED_RELEASE_TABLET | Freq: Every day | ORAL | Status: DC
  Administered 2022-11-12 – 2022-11-13 (×2): 81 mg via ORAL
  Filled 2022-11-11 (×2): qty 1

## 2022-11-11 MED ORDER — ISOSORBIDE MONONITRATE ER 60 MG PO TB24
60.0000 mg | ORAL_TABLET | Freq: Every day | ORAL | Status: DC
  Administered 2022-11-11 – 2022-11-12 (×2): 60 mg via ORAL
  Filled 2022-11-11 (×2): qty 1
  Filled 2022-11-11: qty 2

## 2022-11-11 MED ORDER — MECLIZINE HCL 25 MG PO TABS: 12.5000 mg | ORAL_TABLET | Freq: Three times a day (TID) | ORAL | Status: DC | PRN

## 2022-11-11 MED ORDER — HEPARIN BOLUS VIA INFUSION
4000.0000 [IU] | Freq: Once | INTRAVENOUS | Status: AC
  Administered 2022-11-11: 4000 [IU] via INTRAVENOUS
  Filled 2022-11-11: qty 4000

## 2022-11-11 MED ORDER — AMLODIPINE BESYLATE 5 MG PO TABS
5.0000 mg | ORAL_TABLET | Freq: Every day | ORAL | Status: DC
  Administered 2022-11-12: 5 mg via ORAL
  Filled 2022-11-11 (×2): qty 1

## 2022-11-11 MED ORDER — GUAIFENESIN-DM 100-10 MG/5ML PO SYRP
10.0000 mL | ORAL_SOLUTION | Freq: Four times a day (QID) | ORAL | Status: DC
  Administered 2022-11-12: 10 mL via ORAL
  Filled 2022-11-11 (×3): qty 10

## 2022-11-11 MED ORDER — ONDANSETRON HCL 4 MG/2ML IJ SOLN
4.0000 mg | Freq: Once | INTRAMUSCULAR | Status: AC
  Administered 2022-11-11: 4 mg via INTRAVENOUS
  Filled 2022-11-11: qty 2

## 2022-11-11 MED ORDER — ATORVASTATIN CALCIUM 40 MG PO TABS
40.0000 mg | ORAL_TABLET | Freq: Every day | ORAL | Status: DC
  Administered 2022-11-12 (×2): 40 mg via ORAL
  Filled 2022-11-11 (×2): qty 1

## 2022-11-11 MED ORDER — CARVEDILOL 3.125 MG PO TABS
3.1250 mg | ORAL_TABLET | Freq: Two times a day (BID) | ORAL | Status: DC
  Administered 2022-11-11 – 2022-11-13 (×4): 3.125 mg via ORAL
  Filled 2022-11-11 (×4): qty 1

## 2022-11-11 MED ORDER — ASPIRIN 300 MG RE SUPP: 300.0000 mg | RECTAL | Status: DC

## 2022-11-11 MED ORDER — SODIUM CHLORIDE 0.9 % IV SOLN: 250.0000 mL | INTRAVENOUS | Status: DC | PRN

## 2022-11-11 MED ORDER — ONDANSETRON HCL 4 MG/2ML IJ SOLN: 4.0000 mg | Freq: Four times a day (QID) | INTRAMUSCULAR | Status: DC | PRN

## 2022-11-11 MED ORDER — ATORVASTATIN CALCIUM 40 MG PO TABS: 80.0000 mg | ORAL_TABLET | Freq: Every day | ORAL | Status: DC

## 2022-11-11 MED ORDER — ACETAMINOPHEN 325 MG PO TABS: 650.0000 mg | ORAL_TABLET | ORAL | Status: DC | PRN

## 2022-11-11 MED ORDER — CLOPIDOGREL BISULFATE 75 MG PO TABS
75.0000 mg | ORAL_TABLET | Freq: Every day | ORAL | Status: DC
  Administered 2022-11-12 – 2022-11-13 (×2): 75 mg via ORAL
  Filled 2022-11-11 (×2): qty 1

## 2022-11-11 MED ORDER — ASPIRIN 81 MG PO TBEC: 81.0000 mg | DELAYED_RELEASE_TABLET | Freq: Every day | ORAL | Status: DC

## 2022-11-11 MED ORDER — NITROGLYCERIN 0.4 MG SL SUBL: 0.4000 mg | SUBLINGUAL_TABLET | SUBLINGUAL | Status: DC | PRN

## 2022-11-11 MED ORDER — SODIUM CHLORIDE 0.9 % IV SOLN: INTRAVENOUS | Status: DC

## 2022-11-11 MED ORDER — MORPHINE SULFATE (PF) 4 MG/ML IV SOLN: 4.0000 mg | Freq: Once | INTRAVENOUS | Status: DC

## 2022-11-11 MED ORDER — HYDRALAZINE HCL 25 MG PO TABS
25.0000 mg | ORAL_TABLET | Freq: Two times a day (BID) | ORAL | Status: DC
  Administered 2022-11-12 (×3): 25 mg via ORAL
  Filled 2022-11-11 (×4): qty 1

## 2022-11-11 MED ORDER — SODIUM CHLORIDE 0.9% FLUSH: 3.0000 mL | INTRAVENOUS | Status: DC | PRN

## 2022-11-11 MED ORDER — MORPHINE SULFATE (PF) 4 MG/ML IV SOLN
4.0000 mg | Freq: Once | INTRAVENOUS | Status: AC
  Administered 2022-11-11: 4 mg via INTRAVENOUS
  Filled 2022-11-11: qty 1

## 2022-11-11 MED ORDER — ONDANSETRON HCL 4 MG PO TABS: 4.0000 mg | ORAL_TABLET | Freq: Every day | ORAL | Status: DC | PRN

## 2022-11-11 MED ORDER — HEPARIN (PORCINE) 25000 UT/250ML-% IV SOLN
1150.0000 [IU]/h | INTRAVENOUS | Status: DC
  Administered 2022-11-11: 950 [IU]/h via INTRAVENOUS
  Filled 2022-11-11: qty 250

## 2022-11-11 MED ORDER — OXYCODONE HCL 5 MG PO TABS: 5.0000 mg | ORAL_TABLET | ORAL | Status: DC | PRN

## 2022-11-11 MED ORDER — ASPIRIN 81 MG PO CHEW: 324.0000 mg | CHEWABLE_TABLET | ORAL | Status: DC

## 2022-11-11 MED ORDER — SODIUM CHLORIDE 0.9% FLUSH
3.0000 mL | Freq: Two times a day (BID) | INTRAVENOUS | Status: DC
  Administered 2022-11-12 – 2022-11-13 (×3): 3 mL via INTRAVENOUS

## 2022-11-11 NOTE — ED Notes (Signed)
Patient transported to MRI 

## 2022-11-11 NOTE — ED Notes (Signed)
ED TO INPATIENT HANDOFF REPORT  ED Nurse Name and Phone #: Delfin Edis / [phone]  S Name/Age/Gender Colleen Curtis 57 y.o. female Room/Bed: 037C/037C  Code Status   Code Status: Full Code  Home/SNF/Other Home Patient oriented to: self, place, time, and situation Is this baseline? Yes   Triage Complete: Triage complete  Chief Complaint Acute coronary syndrome [facility]) [I24.9]  Triage Note PER EMS: pt is from work tonight, states she woke up this morning with sharp, non-radiating constant central chest pain that has progressed throughout the day, associated with nausea that just started about 10 min prior to arrival. Hx of STEMI in 2022 with 3 cardiac stents. Hx of stroke, does not take blood thinners, residual left leg weakness.   EMS adm 324 aspirin and 0.4 mg of sublingual nitroglycerin x 2.   BP- 116/86, HR-78, 100% RA   Allergies Allergies  Allergen Reactions   Sulfa Antibiotics Nausea Only and Rash    Level of Care/Admitting Diagnosis ED Disposition     ED Disposition  Admit   Condition  --   Comment  Hospital Area: [facility] [[id number]]  Level of Care: Telemetry Cardiac [[id number]]  May place patient in observation at [facility] or [facility] if equivalent level of care is available:: No  Covid Evaluation: Asymptomatic - no recent exposure (last 10 days) testing not required  Diagnosis: Acute coronary syndrome [facility]) [[id number]]  Admitting Physician: Orpah Cobb [[id number]]  Attending Physician: Orpah Cobb [[id number]]          B Medical/Surgery History Past Medical History:  Diagnosis Date   Bipolar disorder (HCC)    Cancer (HCC)    COPD (chronic obstructive pulmonary disease) (HCC)    Coronary artery disease    Diverticulitis    Hypertension    Myocardial infarction (HCC)    Seizure (HCC)    Stroke [facility])    Past Surgical History:  Procedure Laterality Date   ABDOMINAL SURGERY     BRAIN SURGERY     CHOLECYSTECTOMY     LEFT  HEART CATH AND CORONARY ANGIOGRAPHY N/A 11/27/2020   Procedure: LEFT HEART CATH AND CORONARY ANGIOGRAPHY;  Surgeon: Orpah Cobb, MD;  Location: [facility] INVASIVE CV LAB;  Service: Cardiovascular;  Laterality: N/A;     A IV Location/Drains/Wounds Patient Lines/Drains/Airways Status     Active Line/Drains/Airways     Name Placement date Placement time Site Days   Peripheral IV 11/10/22 20 G Anterior;Left;Proximal Forearm 11/10/22  2340  Forearm  1   Pressure Injury 07/23/22 Toe (Comment  which one) Right;Left 07/23/22  0200  -- 111   Pressure Injury 07/23/22 Foot Left Stage 2 -  Partial thickness loss of dermis presenting as a shallow open injury with a red, pink wound bed without slough. 07/23/22  0200  -- 111            Intake/Output Last 24 hours No intake or output data in the 24 hours ending 11/11/22 1948  Labs/Imaging Results for orders placed or performed during the hospital encounter of 11/10/22 (from the past 48 hour(s))  Basic metabolic panel     Status: Abnormal   Collection Time: 11/10/22 11:40 PM  Result Value Ref Range   Sodium 136 135 - 145 mmol/L   Potassium 3.7 3.5 - 5.1 mmol/L   Chloride 109 98 - 111 mmol/L   CO2 17 (L) 22 - 32 mmol/L   Glucose, Bld 101 (H) 70 - 99 mg/dL    Comment: Glucose reference  range applies only to samples taken after fasting for at least 8 hours.   BUN 29 (H) 6 - 20 mg/dL   Creatinine, Ser 3.66 (H) 0.44 - 1.00 mg/dL   Calcium 8.5 (L) 8.9 - 10.3 mg/dL   GFR, Estimated 15 (L) >60 mL/min    Comment: (NOTE) Calculated using the CKD-EPI Creatinine Equation (2021)    Anion gap 10 5 - 15    Comment: Performed at [facility], [address], [place], [place] [address]  CBC     Status: Abnormal   Collection Time: 11/10/22 11:40 PM  Result Value Ref Range   WBC 11.4 (H) 4.0 - 10.5 K/uL   RBC 4.60 3.87 - 5.11 MIL/uL   Hemoglobin 12.0 12.0 - 15.0 g/dL   HCT 74.2 59.5 - 63.8 %   MCV 82.0 80.0 - 100.0 fL   MCH 26.1 26.0 - 34.0 pg    MCHC 31.8 30.0 - 36.0 g/dL   RDW 75.6 43.3 - 29.5 %   Platelets 264 150 - 400 K/uL   nRBC 0.0 0.0 - 0.2 %    Comment: Performed at [facility], [address] [place], [place] [address]  Troponin I (High Sensitivity)     Status: Abnormal   Collection Time: 11/10/22 11:40 PM  Result Value Ref Range   Troponin I (High Sensitivity) 99 (H) <18 ng/L    Comment: (NOTE) Elevated high sensitivity troponin I (hsTnI) values and significant  changes across serial measurements may suggest ACS but many other  chronic and acute conditions are known to elevate hsTnI results.  Refer to the "Links" section for chest pain algorithms and additional  guidance. Performed at [facility], [address], [place], [place] [address]   Hepatic function panel     Status: Abnormal   Collection Time: 11/10/22 11:40 PM  Result Value Ref Range   Total Protein 6.5 6.5 - 8.1 g/dL   Albumin 3.1 (L) 3.5 - 5.0 g/dL   AST 14 (L) 15 - 41 U/L   ALT 15 0 - 44 U/L   Alkaline Phosphatase 83 38 - 126 U/L   Total Bilirubin 0.5 0.3 - 1.2 mg/dL   Bilirubin, Direct <0.1 0.0 - 0.2 mg/dL   Indirect Bilirubin NOT CALCULATED 0.3 - 0.9 mg/dL    Comment: Performed at [facility], [address] [place], [place] [address]  Lipase, blood     Status: None   Collection Time: 11/10/22 11:40 PM  Result Value Ref Range   Lipase 41 11 - 51 U/L    Comment: Performed at [facility], [address], [place], [place] [address]  Troponin I (High Sensitivity)     Status: Abnormal   Collection Time: 11/11/22 12:41 AM  Result Value Ref Range   Troponin I (High Sensitivity) 89 (H) <18 ng/L    Comment: (NOTE) Elevated high sensitivity troponin I (hsTnI) values and significant  changes across serial measurements may suggest ACS but many other  chronic and acute conditions are known to elevate hsTnI results.  Refer to the "Links" section for chest pain algorithms and additional  guidance. Performed at [facility], [address] [place], [place] [address]   Troponin I (High Sensitivity)     Status: Abnormal   Collection Time: 11/11/22  4:26 PM  Result Value Ref Range   Troponin I (High Sensitivity) 79 (H) <18 ng/L    Comment: (NOTE) Elevated high sensitivity  troponin I (hsTnI) values and significant  changes across serial measurements may suggest ACS but many other  chronic and acute conditions are known to elevate hsTnI results.  Refer to the "Links" section for chest pain algorithms and additional  guidance. Performed at [facility], [address], [place], [place] [address]    MR ANGIO CHEST WO CONTRAST  Result Date: 11/11/2022 CLINICAL DATA:  57 year old female, evaluate for aneurysm or acute aortic syndrome. EXAM: MRA Chest and abdomen with and without contrast TECHNIQUE: Angiographic images of the chest and abdomen were obtained using MRA technique without intravenous contrast. CONTRAST:  None. COMPARISON:  CT abdomen pelvis from 10/12/2022 FINDINGS: Chest MRA: Nondiagnostic vascular study. There are no over aneurysmal changes of the visualized thoracic aorta, however the study is incomplete. Chest MRI No definite large pleural effusion or consolidation. The heart is normal in size. No pericardial effusion. No significant mediastinal lymphadenopathy. Abdomen MRA Nondiagnostic vascular study. There are no over aneurysmal changes of the visualized abdominal aorta, however the study is incomplete. Abdomen MRI The liver is normal in size and morphology. No evidence of significant intra or extrahepatic biliary ductal dilation. Morphology of the visualized adrenal glands, kidneys, spleen, and pancreas are within normal limits. No evidence of significant distension of the visualized gastrointestinal tract. IMPRESSION: Vascular: Nondiagnostic vascular study due to inability to obtain time-of-flight images or administer contrast for arterial phase imaging. CT chest abdomen pelvis without  contrast could be considered for more definitive evaluation. Non-Vascular: No definite acute abnormality in the thorax or abdomen, however this is a limited study. These results were called by telephone at the time of interpretation on 11/11/2022 at 3:00 pm to provider Alvira Monday, MD, who verbally acknowledged these results. Marliss Coots, MD [facility] Electronically Signed   By: Marliss Coots M.D.   On: 11/11/2022 15:04   MR ANGIO ABDOMEN WO CONTRAST  Result Date: 11/11/2022 CLINICAL DATA:  57 year old female, evaluate for aneurysm or acute aortic syndrome. EXAM: MRA Chest and abdomen with and without contrast TECHNIQUE: Angiographic images of the chest and abdomen were obtained using MRA technique without intravenous contrast. CONTRAST:  None. COMPARISON:  CT abdomen pelvis from 10/12/2022 FINDINGS: Chest MRA: Nondiagnostic vascular study. There are no over aneurysmal changes of the visualized thoracic aorta, however the study is incomplete. Chest MRI No definite large pleural effusion or consolidation. The heart is normal in size. No pericardial effusion. No significant mediastinal lymphadenopathy. Abdomen MRA Nondiagnostic vascular study. There are no over aneurysmal changes of the visualized abdominal aorta, however the study is incomplete. Abdomen MRI The liver is normal in size and morphology. No evidence of significant intra or extrahepatic biliary ductal dilation. Morphology of the visualized adrenal glands, kidneys, spleen, and pancreas are within normal limits. No evidence of significant distension of the visualized gastrointestinal tract. IMPRESSION: Vascular: Nondiagnostic vascular study due to inability to obtain time-of-flight images or administer contrast for arterial phase imaging. CT chest abdomen pelvis without contrast could be considered for more definitive evaluation. Non-Vascular: No definite acute abnormality in the thorax or  abdomen, however this is a limited study. These results were called by telephone at the time of interpretation on 11/11/2022 at 3:00 pm to provider Alvira Monday, MD, who verbally acknowledged these results. Marliss Coots, MD [facility] Electronically Signed   By: Marliss Coots M.D.   On: 11/11/2022 15:04   DG Chest 2 View  Result Date: 11/10/2022 CLINICAL DATA:  Chest pain EXAM: CHEST - 2 VIEW COMPARISON:  10/12/2022 FINDINGS: Heart and mediastinal contours are within normal limits. No focal opacities or effusions. No acute bony abnormality. IMPRESSION: No active cardiopulmonary disease. Electronically Signed   By: Charlett Nose M.D.   On: 11/10/2022 22:48    Pending Labs Unresulted Labs (From admission, onward)     Start     Ordered   11/12/22 0500  Lipid panel  Tomorrow morning,   R        11/11/22 1735   11/12/22 0500  Basic metabolic panel  Tomorrow morning,   R        11/11/22 1735   11/12/22 0500  CBC with Differential/Platelet  Tomorrow morning,   R        11/11/22 1755   11/12/22 0300  Heparin level (unfractionated)  Once-Timed,   TIMED        11/11/22 1817   11/11/22 1830  D-dimer, quantitative  Once,   R        11/11/22 1830            Vitals/Pain Today's Vitals   11/11/22 1815 11/11/22 1830 11/11/22 1845 11/11/22 1900  BP: 118/73 121/73 131/72 (!) 140/69  Pulse: (!) 56 (!) 58 (!) 57 (!) 57  Resp: 16 16 16 14   Temp:      TempSrc:      SpO2: 98% 98% 97% 97%  Weight:      Height:      PainSc:        Isolation Precautions No active isolations  Medications Medications  morphine (PF) 4 MG/ML injection 4 mg (4 mg Intravenous Not Given 11/11/22 1645)  nitroGLYCERIN (NITROSTAT) SL tablet 0.4 mg (has no administration in time range)  acetaminophen (TYLENOL) tablet 650 mg (has no administration in time range)  ondansetron (ZOFRAN) injection 4 mg (has no administration in time range)  sodium chloride flush (NS)  0.9 % injection 3 mL (has no administration in time range)  sodium chloride flush (NS) 0.9 % injection 3 mL (has no administration in time range)  0.9 %  sodium chloride infusion (has no administration in time range)  amLODipine (NORVASC) tablet 5 mg (has no administration in time range)  aspirin EC tablet 81 mg (has no administration in time range)  carvedilol (COREG) tablet 3.125 mg (has no administration in time range)  clopidogrel (PLAVIX) tablet 75 mg (has no administration in time range)  isosorbide mononitrate (IMDUR) 24 hr tablet 60 mg (has no administration in time range)  hydrALAZINE (APRESOLINE) tablet 25 mg (has no administration in time range)  meclizine (ANTIVERT) tablet 12.5 mg (has no administration in time range)  ondansetron (ZOFRAN) tablet 4 mg (has no administration in time range)  guaiFENesin-dextromethorphan (ROBITUSSIN DM) 100-10 MG/5ML syrup 10 mL (has no administration in time range)  oxyCODONE (Oxy IR/ROXICODONE) immediate release tablet 5 mg (has no administration in time range)  atorvastatin (LIPITOR) tablet 40 mg (has no administration in time range)  heparin bolus via infusion 4,000 Units (has no administration in time range)  heparin ADULT infusion 100 units/mL (25000 units/256mL) (has no administration in time range)  0.9 %  sodium chloride infusion (has no administration in time range)  morphine (PF) 4 MG/ML injection 4 mg (4 mg Intravenous Given 11/11/22 0016)  ondansetron (ZOFRAN) injection 4 mg (4 mg Intravenous Given 11/11/22 0016)  morphine (PF) 4 MG/ML injection 4 mg (4 mg Intravenous Given 11/11/22 0236)  morphine (PF) 4 MG/ML injection 4 mg (4  mg Intravenous Given 11/11/22 1619)  ondansetron (ZOFRAN) injection 4 mg (4 mg Intravenous Given 11/11/22 1619)    Mobility walks with device     Focused Assessments Cardiac Assessment Handoff:  Cardiac Rhythm: Sinus bradycardia No results found for: "CKTOTAL", "CKMB", "CKMBINDEX", "TROPONINI" No results found  for: "DDIMER" Does the Patient currently have chest pain? Yes    R Recommendations: See Admitting Provider Note  Report given to:   Additional Notes: Pt gets around with cane at home.

## 2022-11-11 NOTE — Progress Notes (Signed)
ANTICOAGULATION CONSULT NOTE - Initial Consult  Pharmacy Consult for heparin Indication: chest pain/ACS  Allergies  Allergen Reactions   Sulfa Antibiotics Nausea Only and Rash    Patient Measurements: Height: 5\' 3"  (160 cm) Weight: 100.7 kg (222 lb) IBW/kg (Calculated) : 52.4 Heparin Dosing Weight: 76.1 kg   Vital Signs: Temp: 97.7 F (36.5 C) (08/05 1715) Temp Source: Oral (08/05 1715) BP: 113/83 (08/05 1615) Pulse Rate: 57 (08/05 1615)  Labs: Recent Labs    11/10/22 2340 11/11/22 0041 11/11/22 1626  HGB 12.0  --   --   HCT 37.7  --   --   PLT 264  --   --   CREATININE 3.44*  --   --   TROPONINIHS 99* 89* 79*    Estimated Creatinine Clearance: 19.9 mL/min (A) (by C-G formula based on SCr of 3.44 mg/dL (H)).   Medical History: Past Medical History:  Diagnosis Date   Bipolar disorder (HCC)    Cancer (HCC)    COPD (chronic obstructive pulmonary disease) (HCC)    Coronary artery disease    Diverticulitis    Hypertension    Myocardial infarction (HCC)    Seizure (HCC)    Stroke (HCC)     Medications:  (Not in a hospital admission)   Assessment: 56 y/o female presenting with chest pain and a PMH of history of STEMI in 2022. CBC wnl   Goal of Therapy:  Heparin level 0.3-0.7 units/ml Monitor platelets by anticoagulation protocol: Yes   Plan:  Heparin bolus 4000 units x 1 Then, start heparin 950 units/hr Heparin level in 8 hours  Monitor CBC and s/sx of bleeding   I  11/11/2022,5:58 PM

## 2022-11-11 NOTE — ED Provider Notes (Signed)
  Provider Note MRN:  [id number]  Arrival date & time: 11/12/22    ED Course and Medical Decision Making  Assumed care from Dr Dalene Seltzer at shift change.  See note from prior team for complete details, in brief:  57 yo female Here w/ chest pain Seems anginal per prior team She had MRA which was non-diagnostic Trop's elevated EKG stable Ongoing chest pain MRA non diagnostic, unable to CTA 2/2 CKD May benefit from TEE to eval for aneurysmal pathology Dr Algie Coffer will admit    .Critical Care  Performed by: Sloan Leiter, DO Authorized by: Sloan Leiter, DO   Critical care provider statement:    Critical care time (minutes):  42   Critical care time was exclusive of:  Separately billable procedures and treating other patients   Critical care was necessary to treat or prevent imminent or life-threatening deterioration of the following conditions:  Cardiac failure   Critical care was time spent personally by me on the following activities:  Development of treatment plan with patient or surrogate, discussions with consultants, evaluation of patient's response to treatment, examination of patient, ordering and review of laboratory studies, ordering and review of radiographic studies, ordering and performing treatments and interventions, pulse oximetry, re-evaluation of patient's condition, review of old charts and obtaining history from patient or surrogate   Care discussed with: admitting provider     Final Clinical Impressions(s) / ED Diagnoses     ICD-10-CM   1. Elevated troponin  R79.89     2. Chest pain, unspecified type  R07.9     3. Chronic kidney disease, unspecified CKD stage  N18.9       ED Discharge Orders     None       Discharge Instructions   None        Sloan Leiter, DO 11/12/22 0002

## 2022-11-11 NOTE — ED Notes (Signed)
Pt taken to restroom in wheelchair. Pt states she has 10/10 chest pain, increasing sob with exertion and she is dizzy. MD aware

## 2022-11-11 NOTE — H&P (Signed)
Referring Physician: Alvira Monday, MD  Colleen Curtis is an 57 y.o. female.                       Chief Complaint: Chest pain  HPI: 57 years old black female with PMH of CAD, s/p 4 stents placed in [place], uncontrolled HTN, tobacco use disorder, morbid obesity, stroke, seizures and non-obstructive hypertrophic cardiomyopathy has retrosternal chest pain not responding much to morphine,SL NTG and aspirin use. Some of the chest pain is caused by cough but not by palpation. She admits to dietary and medication non-compliance and has not used her antihypertensive medications regularly. She also has CKD stage IV. Her 11/27/2020 cardiac cath showed in stent restenosis and proximal LCx lesion with stents from LCX to OM branch interfering to access to the lesion.   Past Medical History:  Diagnosis Date   Bipolar disorder (HCC)    Cancer (HCC)    COPD (chronic obstructive pulmonary disease) (HCC)    Coronary artery disease    Diverticulitis    Hypertension    Myocardial infarction (HCC)    Seizure (HCC)    Stroke [facility])       Past Surgical History:  Procedure Laterality Date   ABDOMINAL SURGERY     BRAIN SURGERY     CHOLECYSTECTOMY     LEFT HEART CATH AND CORONARY ANGIOGRAPHY N/A 11/27/2020   Procedure: LEFT HEART CATH AND CORONARY ANGIOGRAPHY;  Surgeon: Orpah Cobb, MD;  Location: [facility];  Service: Cardiovascular;  Laterality: N/A;    Family History  Problem Relation Age of Onset   Heart disease Mother    Hypertension Mother    Heart disease Father    Diabetes Mellitus II Father    Arrhythmia Brother    Social History:  reports that she has been smoking cigars. She has never used smokeless tobacco. She reports current drug use. Drug: Marijuana. She reports that she does not drink alcohol.  Allergies:  Allergies  Allergen Reactions   Sulfa Antibiotics Nausea Only and Rash    (Not in a hospital admission)   Results for orders placed or performed during the  hospital encounter of 11/10/22 (from the past 48 hour(s))  Basic metabolic panel     Status: Abnormal   Collection Time: 11/10/22 11:40 PM  Result Value Ref Range   Sodium 136 135 - 145 mmol/L   Potassium 3.7 3.5 - 5.1 mmol/L   Chloride 109 98 - 111 mmol/L   CO2 17 (L) 22 - 32 mmol/L   Glucose, Bld 101 (H) 70 - 99 mg/dL    Comment: Glucose reference range applies only to samples taken after fasting for at least 8 hours.   BUN 29 (H) 6 - 20 mg/dL   Creatinine, Ser 1.61 (H) 0.44 - 1.00 mg/dL   Calcium 8.5 (L) 8.9 - 10.3 mg/dL   GFR, Estimated 15 (L) >60 mL/min    Comment: (NOTE) Calculated using the CKD-EPI Creatinine Equation (2021)    Anion gap 10 5 - 15    Comment: Performed at [facility], [address], [place], [place] [address]  CBC     Status: Abnormal   Collection Time: 11/10/22 11:40 PM  Result Value Ref Range   WBC 11.4 (H) 4.0 - 10.5 K/uL   RBC 4.60 3.87 - 5.11 MIL/uL   Hemoglobin 12.0 12.0 - 15.0 g/dL   HCT 54.0 98.1 - 19.1 %   MCV 82.0 80.0 - 100.0 fL  MCH 26.1 26.0 - 34.0 pg   MCHC 31.8 30.0 - 36.0 g/dL   RDW 16.1 09.6 - 04.5 %   Platelets 264 150 - 400 K/uL   nRBC 0.0 0.0 - 0.2 %    Comment: Performed at [facility], [address] [place], [place] [address]  Troponin I (High Sensitivity)     Status: Abnormal   Collection Time: 11/10/22 11:40 PM  Result Value Ref Range   Troponin I (High Sensitivity) 99 (H) <18 ng/L    Comment: (NOTE) Elevated high sensitivity troponin I (hsTnI) values and significant  changes across serial measurements may suggest ACS but many other  chronic and acute conditions are known to elevate hsTnI results.  Refer to the "Links" section for chest pain algorithms and additional  guidance. Performed at [facility], [address] [place], [place] [address]   Hepatic function panel     Status: Abnormal   Collection Time: 11/10/22 11:40 PM  Result Value Ref Range   Total Protein 6.5 6.5 - 8.1 g/dL   Albumin  3.1 (L) 3.5 - 5.0 g/dL   AST 14 (L) 15 - 41 U/L   ALT 15 0 - 44 U/L   Alkaline Phosphatase 83 38 - 126 U/L   Total Bilirubin 0.5 0.3 - 1.2 mg/dL   Bilirubin, Direct <8.2 0.0 - 0.2 mg/dL   Indirect Bilirubin NOT CALCULATED 0.3 - 0.9 mg/dL    Comment: Performed at [facility], [address] [place], [place] [address]  Lipase, blood     Status: None   Collection Time: 11/10/22 11:40 PM  Result Value Ref Range   Lipase 41 11 - 51 U/L    Comment: Performed at [facility], [address], [place], [place] [address]  Troponin I (High Sensitivity)     Status: Abnormal   Collection Time: 11/11/22 12:41 AM  Result Value Ref Range   Troponin I (High Sensitivity) 89 (H) <18 ng/L    Comment: (NOTE) Elevated high sensitivity troponin I (hsTnI) values and significant  changes across serial measurements may suggest ACS but many other  chronic and acute conditions are known to elevate hsTnI results.  Refer to the "Links" section for chest pain algorithms and additional  guidance. Performed at [facility], [address], [place], [place] [address]   Troponin I (High Sensitivity)     Status: Abnormal   Collection Time: 11/11/22  4:26 PM  Result Value Ref Range   Troponin I (High Sensitivity) 79 (H) <18 ng/L    Comment: (NOTE) Elevated high sensitivity troponin I (hsTnI) values and significant  changes across serial measurements may suggest ACS but many other  chronic and acute conditions are known to elevate hsTnI results.  Refer to the "Links" section for chest pain algorithms and additional  guidance. Performed at [facility], [address], [place], [place] [address]    MR ANGIO CHEST WO CONTRAST  Result Date: 11/11/2022 CLINICAL DATA:  57 year old female, evaluate for aneurysm or acute aortic syndrome. EXAM: MRA Chest and abdomen with and without contrast TECHNIQUE: Angiographic images of the chest and abdomen were obtained using MRA technique without  intravenous contrast. CONTRAST:  None. COMPARISON:  CT abdomen pelvis from 10/12/2022 FINDINGS: Chest MRA: Nondiagnostic vascular study. There are no over aneurysmal changes of the visualized thoracic aorta, however the study is incomplete. Chest MRI No definite large pleural effusion or consolidation. The heart is normal in  size. No pericardial effusion. No significant mediastinal lymphadenopathy. Abdomen MRA Nondiagnostic vascular study. There are no over aneurysmal changes of the visualized abdominal aorta, however the study is incomplete. Abdomen MRI The liver is normal in size and morphology. No evidence of significant intra or extrahepatic biliary ductal dilation. Morphology of the visualized adrenal glands, kidneys, spleen, and pancreas are within normal limits. No evidence of significant distension of the visualized gastrointestinal tract. IMPRESSION: Vascular: Nondiagnostic vascular study due to inability to obtain time-of-flight images or administer contrast for arterial phase imaging. CT chest abdomen pelvis without contrast could be considered for more definitive evaluation. Non-Vascular: No definite acute abnormality in the thorax or abdomen, however this is a limited study. These results were called by telephone at the time of interpretation on 11/11/2022 at 3:00 pm to provider Alvira Monday, MD, who verbally acknowledged these results. Marliss Coots, MD [facility] Electronically Signed   By: Marliss Coots M.D.   On: 11/11/2022 15:04   MR ANGIO ABDOMEN WO CONTRAST  Result Date: 11/11/2022 CLINICAL DATA:  57 year old female, evaluate for aneurysm or acute aortic syndrome. EXAM: MRA Chest and abdomen with and without contrast TECHNIQUE: Angiographic images of the chest and abdomen were obtained using MRA technique without intravenous contrast. CONTRAST:  None. COMPARISON:  CT abdomen pelvis from 10/12/2022 FINDINGS: Chest MRA: Nondiagnostic  vascular study. There are no over aneurysmal changes of the visualized thoracic aorta, however the study is incomplete. Chest MRI No definite large pleural effusion or consolidation. The heart is normal in size. No pericardial effusion. No significant mediastinal lymphadenopathy. Abdomen MRA Nondiagnostic vascular study. There are no over aneurysmal changes of the visualized abdominal aorta, however the study is incomplete. Abdomen MRI The liver is normal in size and morphology. No evidence of significant intra or extrahepatic biliary ductal dilation. Morphology of the visualized adrenal glands, kidneys, spleen, and pancreas are within normal limits. No evidence of significant distension of the visualized gastrointestinal tract. IMPRESSION: Vascular: Nondiagnostic vascular study due to inability to obtain time-of-flight images or administer contrast for arterial phase imaging. CT chest abdomen pelvis without contrast could be considered for more definitive evaluation. Non-Vascular: No definite acute abnormality in the thorax or abdomen, however this is a limited study. These results were called by telephone at the time of interpretation on 11/11/2022 at 3:00 pm to provider Alvira Monday, MD, who verbally acknowledged these results. Marliss Coots, MD [facility] Electronically Signed   By: Marliss Coots M.D.   On: 11/11/2022 15:04   DG Chest 2 View  Result Date: 11/10/2022 CLINICAL DATA:  Chest pain EXAM: CHEST - 2 VIEW COMPARISON:  10/12/2022 FINDINGS: Heart and mediastinal contours are within normal limits. No focal opacities or effusions. No acute bony abnormality. IMPRESSION: No active cardiopulmonary disease. Electronically Signed   By: Charlett Nose M.D.   On: 11/10/2022 22:48    Review Of Systems Constitutional: No fever, chills, Positive weight gain. Eyes: No vision change, wears glasses. No discharge or pain. Ears: No hearing loss, No  tinnitus. Respiratory: No asthma, COPD, pneumonias. Positive shortness of breath. No hemoptysis. Cardiovascular: Positive chest pain, palpitation, leg edema. Gastrointestinal: No nausea, vomiting, diarrhea, constipation. No GI bleed. No hepatitis. Genitourinary: No dysuria, hematuria, kidney stone. No incontinance. Neurological: No headache, stroke, seizures.  Psychiatry: No psych facility admission for anxiety, depression, suicide. No detox. Skin: No rash. Musculoskeletal: Positive joint pain, fibromyalgia. No neck pain, back pain. Lymphadenopathy: No lymphadenopathy. Hematology: No anemia  or easy bruising.   Blood pressure 113/83, pulse (!) 57, temperature 97.7 F (36.5 C), temperature source Oral, resp. rate 14, height 5\' 3"  (1.6 m), weight 100.7 kg, SpO2 100%. Body mass index is 39.33 kg/m. General appearance: alert, cooperative, appears stated age and no distress Head: Normocephalic, atraumatic. Eyes: Brown eyes, pink conjunctiva, corneas clear.  Neck: No adenopathy, no carotid bruit, no JVD, supple, symmetrical, trachea midline and thyroid not enlarged. Resp: Clear to auscultation bilaterally. Cardio: Regular rate and rhythm, S1, S2 normal, II/VI systolic murmur, no click, rub or gallop GI: Soft, distneded, non-tender; bowel sounds normal; no organomegaly. Extremities: No edema, cyanosis or clubbing. Skin: Warm and dry.  Neurologic: Alert and oriented X 3, normal strength. Normal coordination.  Assessment/Plan Acute coronary syndrome CAD S/P coronary stents Chronic CKD Abnormal Troponin I from demand ischemia Morbid obesity Hypertension Tobacco use disorder  Plan: Consider TEE in AM if patient is agreeable. Resume long acting NTG. Oxycodone for pain control Robitussin for cough.  Time spent: Review of old records, Lab, x-rays, EKG, other cardiac tests, examination, discussion with patient/Doctor/Nurse over 70 minutes.  Ricki Rodriguez, MD  11/11/2022, 5:42 PM

## 2022-11-11 NOTE — Consult Note (Signed)
Cardiology Consultation:   Patient ID: Colleen Curtis MRN: [id number]; DOB: 1966/02/02  Admit date: 11/10/2022 Date of Consult: 11/11/2022  Primary Care Provider: Oneita Hurt No [facility] Cardiologist: None  [facility] Electrophysiologist:  None    Patient Profile:   Colleen Curtis is a 57 y.o. female with a hx of STEMI in 2022, HTN, CVA, COPD who is being seen today for the evaluation of chest pain at the request of the ED.  History of Present Illness:   Colleen Curtis reported to the ED that she woke up this morning with mild sharp, non-radiating constant central chest pain radiating to her back. It has progressed throughout the day. She presented to work, and at around 8pm, she developed significant worsening of her chest pain radiating to her back. The pain feels "deep", rated at a 7/10. She is unable to provide a description of the chest pain (e.g. sharp, achy, etc). She states this chest pain feels different than that from her MI in 2022, whereas that was acute onset and severe, and this has been gradually progressive. She denies radiation to her arms or neck. She denies vomiting, though had some nausea.  She states she has had some episode of sharp pains in her chest for the past 2 weeks, but these are all non-exertional, "come and go" randomly, without associate n/v/radiation/sob. Those feel different than her chest pain today.  Due to her chest pain, she called EMS. EMS administered 324 aspirin and 0.4 mg of sublingual nitroglycerin x 2.   Past Medical History:  Diagnosis Date   Bipolar disorder (HCC)    Cancer (HCC)    COPD (chronic obstructive pulmonary disease) (HCC)    Coronary artery disease    Diverticulitis    Hypertension    Myocardial infarction (HCC)    Seizure (HCC)    Stroke [facility])     Past Surgical History:  Procedure Laterality Date   ABDOMINAL SURGERY     BRAIN SURGERY     CHOLECYSTECTOMY     LEFT HEART CATH AND CORONARY ANGIOGRAPHY N/A  11/27/2020   Procedure: LEFT HEART CATH AND CORONARY ANGIOGRAPHY;  Surgeon: Orpah Cobb, MD;  Location: [facility] INVASIVE CV LAB;  Service: Cardiovascular;  Laterality: N/A;     Home Medications:  Prior to Admission medications   Medication Sig Start Date End Date Taking? Authorizing Provider  acetaminophen (TYLENOL) 325 MG tablet Take 2 tablets (650 mg total) by mouth every 4 (four) hours as needed for headache or mild pain. Patient taking differently: Take 3 tablets by mouth 2 (two) times daily as needed for headache or mild pain. 11/29/20   Orpah Cobb, MD  amLODipine (NORVASC) 5 MG tablet Take 1 tablet (5 mg total) by mouth daily. 09/26/22   Elmer Picker, NP  aspirin EC 81 MG tablet Take 1 tablet (81 mg total) by mouth daily. Swallow whole. 09/25/22   Gloris Manchester, MD  atorvastatin (LIPITOR) 80 MG tablet Take 1 tablet (80 mg total) by mouth at bedtime. 09/25/22   Gloris Manchester, MD  carvedilol (COREG) 3.125 MG tablet Take 1 tablet (3.125 mg total) by mouth 2 (two) times daily with a meal. 09/25/22   Elmer Picker, NP  clopidogrel (PLAVIX) 75 MG tablet Take 1 tablet (75 mg total) by mouth daily. Patient not taking: Reported on 10/13/2022 09/25/22   Gloris Manchester, MD  hydrALAZINE (APRESOLINE) 25 MG tablet Take 1 tablet (25 mg total) by mouth 2 (two) times daily. 09/25/22   Elmer Picker, NP  isosorbide mononitrate (IMDUR) 60 MG 24 hr tablet Take 1 tablet (60 mg total) by mouth daily. 09/25/22   Gloris Manchester, MD  meclizine (ANTIVERT) 12.5 MG tablet Take 1 tablet (12.5 mg total) by mouth 3 (three) times daily as needed for dizziness. 10/15/22   Sherryll Burger, Pratik D, DO  ondansetron (ZOFRAN) 4 MG tablet Take 1 tablet (4 mg total) by mouth daily as needed for nausea or vomiting. 10/15/22 10/15/23  Maurilio Lovely D, DO    Inpatient Medications: Scheduled Meds:  Continuous Infusions:  PRN Meds:   Allergies:   No Known Allergies  Social History:   Social History   Socioeconomic History   Marital status: Single     Spouse name: Not on file   Number of children: 2   Years of education: Not on file   Highest education level: Not on file  Occupational History   Occupation: Security Guard    Comment: Designer, industrial/product  Tobacco Use   Smoking status: Some Days    Types: Cigars   Smokeless tobacco: Never  Vaping Use   Vaping status: Never Used  Substance and Sexual Activity   Alcohol use: Never   Drug use: Yes    Types: Marijuana    Comment: " medical Marijuana "   Sexual activity: Yes  Other Topics Concern   Not on file  Social History Narrative   Not on file   Social Determinants of Health   Financial Resource Strain: Not on file  Food Insecurity: Food Insecurity Present (07/23/2022)   Hunger Vital Sign    Worried About Running Out of Food in the Last Year: Never true    Ran Out of Food in the Last Year: Sometimes true  Transportation Needs: No Transportation Needs (07/23/2022)   PRAPARE - Administrator, Civil Service (Medical): No    Lack of Transportation (Non-Medical): No  Physical Activity: Not on file  Stress: Not on file  Social Connections: Unknown (08/21/2021)   Received from [facility]   Social Network    Social Network: Not on file  Intimate Partner Violence: Not At Risk (07/23/2022)   Humiliation, Afraid, Rape, and Kick questionnaire    Fear of Current or Ex-Partner: No    Emotionally Abused: No    Physically Abused: No    Sexually Abused: No    Family History:   Family History  Problem Relation Age of Onset   Heart disease Mother    Hypertension Mother    Heart disease Father    Diabetes Mellitus II Father    Arrhythmia Brother      ROS:  Review of Systems: [y] = yes, [ ]  = no      General: Weight gain [ ] ; Weight loss [ ] ; Anorexia [ ] ; Fatigue [ ] ; Fever [ ] ; Chills [ ] ; Weakness [ ]    Cardiac: Chest pain/pressure [place]ienne ]; Resting SOB [ ] ; Exertional SOB [ ] ; Orthopnea [ ] ; Pedal Edema [ ] ; Palpitations [ ] ; Syncope [ ] ; Presyncope [ ] ;  Paroxysmal nocturnal dyspnea [ ]    Pulmonary: Cough [ ] ; Wheezing [ ] ; Hemoptysis [ ] ; Sputum [ ] ; Snoring [ ]    GI: Vomiting [ ] ; Dysphagia [ ] ; Melena [ ] ; Hematochezia [ ] ; Heartburn [ ] ; Abdominal pain [ ] ; Constipation [ ] ; Diarrhea [ ] ; BRBPR [ ]    GU: Hematuria [ ] ; Dysuria [ ] ; Nocturia [ ]  Vascular: Pain in legs with walking [ ] ; Pain in feet with  lying flat [ ] ; Non-healing sores [ ] ; Stroke [ ] ; TIA [ ] ; Slurred speech [ ] ;   Neuro: Headaches [ ] ; Vertigo [ ] ; Seizures [ ] ; Paresthesias [ ] ;Blurred vision [ ] ; Diplopia [ ] ; Vision changes [ ]    Ortho/Skin: Arthritis [ ] ; Joint pain [ ] ; Muscle pain [ ] ; Joint swelling [ ] ; Back Pain [ ] ; Rash [ ]    Psych: Depression [ ] ; Anxiety [ ]    Heme: Bleeding problems [ ] ; Clotting disorders [ ] ; Anemia [ ]    Endocrine: Diabetes [ ] ; Thyroid dysfunction [ ]    Physical Exam/Data:   Vitals:   11/10/22 2300 11/10/22 2345 11/11/22 0030 11/11/22 0130  BP: 131/83 134/79 130/82 131/88  Pulse: 62 62 62 62  Resp: 13 13 18 14   Temp:      SpO2: 100% 100% 98% 100%  Weight:      Height:       No intake or output data in the 24 hours ending 11/11/22 0218    11/10/2022   10:12 PM 10/13/2022    9:31 PM 10/13/2022    1:10 AM  Last 3 Weights  Weight (lbs) 222 lb 222 lb 11.2 oz 220 lb 3.8 oz  Weight (kg) 100.699 kg 101.016 kg 99.9 kg     Body mass index is 39.33 kg/m.  General:  Well nourished, well developed, in no acute distress HEENT: normal Lymph: no adenopathy Neck: no JVD Endocrine:  No thryomegaly Vascular: No carotid bruits; FA pulses 2+ bilaterally without bruits  Cardiac:  normal S1, S2; RRR; no murmur  Lungs:  clear to auscultation bilaterally, no wheezing, rhonchi or rales  Abd: soft, nontender, no hepatomegaly  Ext: no edema Musculoskeletal:  No deformities, BUE and BLE strength normal and equal Skin: warm and dry  Neuro:  CNs 2-12 intact, no focal abnormalities noted Psych:  Normal affect   EKG:  The EKG was personally  reviewed and demonstrates:  NSR, interventricular conduction delay Telemetry:  Telemetry was personally reviewed and demonstrates:  nsr with ventricular rates in the 60s  Relevant CV Studies:  Coronary Angiography (11/27/2020): Left Main  Vessel was injected. Vessel is very large. Vessel is angiographically normal. The vessel is mildly calcified.    Left Anterior Descending  Vessel was injected. Vessel is large. There is mild focal disease in the vessel.  Mid LAD lesion is 30% stenosed. Vessel is not the culprit lesion. The lesion is type A and concentric. The lesion was previously treated using a drug eluting stent over 2 years ago. Previously placed stent displays no rethrombosis. Previously placed stent displays restenosis. The stenosis was measured by a visual reading. Pressure wire/FFR was not performed on the lesion.    Left Circumflex  Vessel was injected. Vessel is small. There is severe focal disease in the vessel.  Prox Cx lesion is 80% stenosed. Vessel is the culprit lesion. The lesion is type C, located at the major branch, focal and concentric. The lesion was not previously treated . The stenosis was measured by a visual reading. Pressure wire/FFR was not performed on the lesion. IVUS was not performed. No optical coherence tomography (OCT) was performed.    First Obtuse Marginal Branch  1st Mrg lesion is 50% stenosed. Vessel is not the culprit lesion. The lesion is type C, eccentric and irregular. The lesion was previously treated using a drug eluting stent over 2 years ago. Previously placed stent displays no rethrombosis. Previously placed stent displays restenosis. The stenosis was  measured by a visual reading. Pressure wire/FFR was not performed on the lesion. IVUS was not performed. No optical coherence tomography (OCT) was performed.    Right Coronary Artery  Vessel was injected. Vessel is large. There is mild focal disease in the vessel.  Prox RCA to Mid RCA lesion is 30%  stenosed. Vessel is not the culprit lesion. The lesion is type A and concentric. The lesion was previously treated using a drug eluting stent over 2 years ago. Previously placed stent displays no rethrombosis. Previously placed stent displays restenosis. The stenosis was measured by a visual reading. Pressure wire/FFR was not performed on the lesion. IVUS was not performed. No optical coherence tomography (OCT) was performed.      Laboratory Data:  High Sensitivity Troponin:   Recent Labs  Lab 10/12/22 1925 10/12/22 2027 11/10/22 2340 11/11/22 0041  TROPONINIHS 53* 54* 99* 89*     Chemistry Recent Labs  Lab 11/10/22 2340  NA 136  K 3.7  CL 109  CO2 17*  GLUCOSE 101*  BUN 29*  CREATININE 3.44*  CALCIUM 8.5*  GFRNONAA 15*  ANIONGAP 10    Recent Labs  Lab 11/10/22 2340  PROT 6.5  ALBUMIN 3.1*  AST 14*  ALT 15  ALKPHOS 83  BILITOT 0.5   Hematology Recent Labs  Lab 11/10/22 2340  WBC 11.4*  RBC 4.60  HGB 12.0  HCT 37.7  MCV 82.0  MCH 26.1  MCHC 31.8  RDW 14.9  PLT 264   BNPNo results for input(s): "BNP", "PROBNP" in the last 168 hours.  DDimer No results for input(s): "DDIMER" in the last 168 hours.  Radiology/Studies:  DG Chest 2 View  Result Date: 11/10/2022 CLINICAL DATA:  Chest pain EXAM: CHEST - 2 VIEW COMPARISON:  10/12/2022 FINDINGS: Heart and mediastinal contours are within normal limits. No focal opacities or effusions. No acute bony abnormality. IMPRESSION: No active cardiopulmonary disease. Electronically Signed   By: Charlett Nose M.D.   On: 11/10/2022 22:48      TIMI Risk Score for Unstable Angina or Non-ST Elevation MI:   The patient's TIMI risk score is 3, which indicates a 13% risk of all cause mortality, new or recurrent myocardial infarction or need for urgent revascularization in the next 14 days.   Assessment and Plan:   Colleen Curtis is a 57 y.o. female with a hx of STEMI in 2022, HTN, CVA, COPD who is being seen today for the  evaluation of chest pain at the request of the ED.  Atypical Chest Pain (Unlikely Cardiac) Chronic Myocardial Injury in setting of CKD Presenting with progressive sharp central chest pain radiating to her back that has been getting worse throughout the day. ECG shows sinus rhythm without ST changes, and troponins trended from 99 -> 89. Her description of her chest pain is atypical, and unlikely cardiac in origin. Her troponin delta is <20%, which rules out myocardial infarction. The source of her elevated troponins between 80s to 90s is from chronic myocardial injury with poor troponin clearance due to her severe CKD (Cr 3.4). Regarding her chest pain etiology, Ddx would include aortic dissection, esophageal spasm, precordial catch, GI (e.g. GERD, esophagitis, PUD, biliary cholic), pleuritis,  costochrondritis, -Consider CT dissection protocol or MRA (discussed with ED in person) given radiation to back -Recommend evaluation of non-cardiac causes of chest pain -Would not recommend any stress testing for her chronic myocardial injury and troponin elevation 2/2 CKD, would not recommend stress testing for her atypical chest  pain over the past 2 weeks -Would not heparinize at this time due to r/o of dissection amongst other diagnosis above with risks associated with anticoagulation  [facility] will sign off.   Medication Recommendations:  ED evaluation of chest pain Other recommendations (labs, testing, etc):  as above, consider CTA vs MRA vs risk benefit discussion with patient given advanced CKD Follow up as an outpatient:  per routine  For questions or updates, please contact [facility] Please consult [email or web address] for contact info under     Signed, Freddy Finner, MD  11/11/2022 2:18 AM

## 2022-11-12 ENCOUNTER — Encounter (HOSPITAL_COMMUNITY): Payer: Self-pay | Admitting: Cardiovascular Disease

## 2022-11-12 ENCOUNTER — Encounter (HOSPITAL_COMMUNITY)
Admission: EM | Disposition: A | Discharge status: Home / Self Care | Payer: Self-pay | Source: Home / Self Care | Attending: Emergency Medicine

## 2022-11-12 ENCOUNTER — Observation Stay (HOSPITAL_COMMUNITY): Payer: Self-pay

## 2022-11-12 ENCOUNTER — Observation Stay (HOSPITAL_BASED_OUTPATIENT_CLINIC_OR_DEPARTMENT_OTHER): Payer: Self-pay | Admitting: Anesthesiology

## 2022-11-12 ENCOUNTER — Observation Stay (HOSPITAL_COMMUNITY): Payer: Self-pay | Admitting: Anesthesiology

## 2022-11-12 DIAGNOSIS — I129 Hypertensive chronic kidney disease with stage 1 through stage 4 chronic kidney disease, or unspecified chronic kidney disease: Secondary | ICD-10-CM

## 2022-11-12 DIAGNOSIS — N183 Chronic kidney disease, stage 3 unspecified: Secondary | ICD-10-CM

## 2022-11-12 DIAGNOSIS — F17210 Nicotine dependence, cigarettes, uncomplicated: Secondary | ICD-10-CM

## 2022-11-12 DIAGNOSIS — I340 Nonrheumatic mitral (valve) insufficiency: Secondary | ICD-10-CM

## 2022-11-12 HISTORY — PX: TEE WITHOUT CARDIOVERSION: SHX5443

## 2022-11-12 LAB — HEPARIN LEVEL (UNFRACTIONATED): Heparin Unfractionated: <0.1 IU/mL — ABNORMAL LOW (ref 0.30–0.70)

## 2022-11-12 SURGERY — ECHOCARDIOGRAM, TRANSESOPHAGEAL
Anesthesia: Monitor Anesthesia Care

## 2022-11-12 MED ORDER — HEPARIN BOLUS VIA INFUSION
1000.0000 [IU] | Freq: Once | INTRAVENOUS | Status: AC
  Administered 2022-11-12: 1000 [IU] via INTRAVENOUS
  Filled 2022-11-12: qty 1000

## 2022-11-12 MED ORDER — PHENYLEPHRINE HCL-NACL 20-0.9 MG/250ML-% IV SOLN
INTRAVENOUS | Status: DC | PRN
  Administered 2022-11-12: 25 ug/min via INTRAVENOUS

## 2022-11-12 MED ORDER — LIDOCAINE 2% (20 MG/ML) 5 ML SYRINGE
INTRAMUSCULAR | Status: DC | PRN
  Administered 2022-11-12: 40 mg via INTRAVENOUS

## 2022-11-12 MED ORDER — PROPOFOL 500 MG/50ML IV EMUL
INTRAVENOUS | Status: DC | PRN
  Administered 2022-11-12: 100 ug/kg/min via INTRAVENOUS

## 2022-11-12 NOTE — Plan of Care (Signed)
  Problem: Education: Goal: Individualized Educational Video(s) Outcome: Progressing   Problem: Education: Goal: Understanding of cardiac disease, CV risk reduction, and recovery process will improve Outcome: Progressing   Problem: Activity: Goal: Ability to tolerate increased activity will improve Outcome: Progressing   Problem: Health Behavior/Discharge Planning: Goal: Ability to safely manage health-related needs after discharge will improve Outcome: Progressing

## 2022-11-12 NOTE — Progress Notes (Signed)
Heparin gtt discontinued per verbal order from Dr Algie Coffer.

## 2022-11-12 NOTE — Progress Notes (Signed)
IV Heparin gtt continues infusing at 11.5cc/hour (1150 units/hr) into LAC PIV upon arrival to cath lab holding, [place] [address], monitor in place, safety maintained

## 2022-11-12 NOTE — Progress Notes (Signed)
ANTICOAGULATION CONSULT NOTE - Follow Up Consult  Pharmacy Consult for heparin Indication: chest pain/ACS  Labs: Recent Labs    11/10/22 2340 11/11/22 0041 11/11/22 1626 11/12/22 0330  HGB 12.0  --   --  11.1*  HCT 37.7  --   --  33.9*  PLT 264  --   --  244  HEPARINUNFRC  --   --   --  0.21*  CREATININE 3.44*  --   --  3.41*  TROPONINIHS 99* 89* 79*  --     Assessment: 57yo female subtherapeutic on heparin with initial dosing for ACS; no infusion issues or signs of bleeding per RN.  Goal of Therapy:  Heparin level 0.3-0.7 units/ml   Plan:  1000 units heparin bolus. Increase heparin infusion by 2 units/kg/hr to 1150 units/hr. Check level in 8 hours.   Vernard Gambles, PharmD, BCPS 11/12/2022 4:45 AM

## 2022-11-12 NOTE — Anesthesia Procedure Notes (Signed)
Procedure Name: MAC Date/Time: 11/12/2022 1:35 PM  Performed by: Randon Goldsmith, CRNAPre-anesthesia Checklist: Patient identified, Emergency Drugs available, Suction available and Patient being monitored Patient Re-evaluated:Patient Re-evaluated prior to induction Oxygen Delivery Method: Simple face mask Preoxygenation: Pre-oxygenation with 100% oxygen

## 2022-11-12 NOTE — Progress Notes (Signed)
Ref: Pcp, No   Subjective:  Awake.  VS stable. NSR. Awaiting TEE. Elevated LDL cholesterol of 130 mg. and low HDL of 35 mg.  Objective:  Vital Signs in the last 24 hours: Temp:  [97.7 F (36.5 C)-98.7 F (37.1 C)] 97.9 F (36.6 C) (08/06 0730) Pulse Rate:  [53-72] 72 (08/06 0942) Cardiac Rhythm: Normal sinus rhythm;Heart block (08/06 0700) Resp:  [12-20] 12 (08/06 0942) BP: (94-140)/(66-96) 101/69 (08/06 0942) SpO2:  [93 %-100 %] 93 % (08/06 0730) Weight:  [99.2 kg] 99.2 kg (08/05 2155)  Physical Exam: BP Readings from Last 1 Encounters:  11/12/22 101/69     Wt Readings from Last 1 Encounters:  11/11/22 99.2 kg    Weight change: -1.499 kg Body mass index is 38.74 kg/m. HEENT: [place]/AT, Eyes-Brown, PERL, EOMI, Conjunctiva-Pink, Sclera-Non-icteric Neck: No JVD, No bruit, Trachea midline. Lungs:  Clear, Bilateral. Cardiac:  Regular rhythm, normal S1 and S2, no S3. II/VI systolic murmur. Abdomen:  Soft, non-tender. BS present. Extremities:  No edema present. No cyanosis. No clubbing. CNS: AxOx3, Cranial nerves grossly intact, moves all 4 extremities.  Skin: Warm and dry.   Intake/Output from previous day: 08/05 0701 - 08/06 0700 In: 330.6 [P.O.:240; I.V.:90.6] Out: 100 [Urine:100]    Lab Results: BMET    Component Value Date/Time   NA 135 11/12/2022 0330   NA 136 11/10/2022 2340   NA 137 10/15/2022 0731   K 4.0 11/12/2022 0330   K 3.7 11/10/2022 2340   K 4.1 10/15/2022 0731   CL 111 11/12/2022 0330   CL 109 11/10/2022 2340   CL 112 (H) 10/15/2022 0731   CO2 14 (L) 11/12/2022 0330   CO2 17 (L) 11/10/2022 2340   CO2 15 (L) 10/15/2022 0731   GLUCOSE 82 11/12/2022 0330   GLUCOSE 101 (H) 11/10/2022 2340   GLUCOSE 91 10/15/2022 0731   BUN 26 (H) 11/12/2022 0330   BUN 29 (H) 11/10/2022 2340   BUN 40 (H) 10/15/2022 0731   CREATININE 3.41 (H) 11/12/2022 0330   CREATININE 3.44 (H) 11/10/2022 2340   CREATININE 3.89 (H) 10/15/2022 0731   CALCIUM 8.2 (L) 11/12/2022  0330   CALCIUM 8.5 (L) 11/10/2022 2340   CALCIUM 8.6 (L) 10/15/2022 0731   CALCIUM 7.8 (L) 06/07/2022 0114   GFRNONAA 15 (L) 11/12/2022 0330   GFRNONAA 15 (L) 11/10/2022 2340   GFRNONAA 13 (L) 10/15/2022 0731   CBC    Component Value Date/Time   WBC 8.7 11/12/2022 0330   RBC 4.21 11/12/2022 0330   HGB 11.1 (L) 11/12/2022 0330   HCT 33.9 (L) 11/12/2022 0330   PLT 244 11/12/2022 0330   MCV 80.5 11/12/2022 0330   MCH 26.4 11/12/2022 0330   MCHC 32.7 11/12/2022 0330   RDW 14.9 11/12/2022 0330   LYMPHSABS 2.5 11/12/2022 0330   MONOABS 0.6 11/12/2022 0330   EOSABS 0.2 11/12/2022 0330   BASOSABS 0.1 11/12/2022 0330   HEPATIC Function Panel Recent Labs    10/12/22 1925 10/14/22 0841 11/10/22 2340  PROT 7.1 6.0* 6.5  ALBUMIN 3.6 3.1* 3.1*  AST 15 14* 14*  ALT 13 13 15   ALKPHOS 82 67 83  BILIDIR  --   --  <0.1  IBILI  --   --  NOT CALCULATED   HEMOGLOBIN A1C Lab Results  Component Value Date   MPG 108.28 07/23/2022   CARDIAC ENZYMES No results found for: "CKTOTAL", "CKMB", "CKMBINDEX", "TROPONINI" BNP No results for input(s): "PROBNP" in the last 8760 hours. TSH No  results for input(s): "TSH" in the last 8760 hours. CHOLESTEROL Recent Labs    06/06/22 0505 07/23/22 0424 11/12/22 0330  CHOL 261* 273* 190    Scheduled Meds:  amLODipine  5 mg Oral Daily   aspirin EC  81 mg Oral Daily   atorvastatin  40 mg Oral QHS   carvedilol  3.125 mg Oral BID WC   clopidogrel  75 mg Oral Daily   guaiFENesin-dextromethorphan  10 mL Oral QID   hydrALAZINE  25 mg Oral BID   isosorbide mononitrate  60 mg Oral Daily   sodium chloride flush  3 mL Intravenous Q12H   Continuous Infusions:  sodium chloride     sodium chloride Stopped (11/11/22 1950)   heparin 1,150 Units/hr (11/12/22 0505)   PRN Meds:.sodium chloride, acetaminophen, meclizine, nitroGLYCERIN, ondansetron (ZOFRAN) IV, ondansetron, oxyCODONE, sodium chloride flush  Assessment/Plan: Acute coronary  syndrome CAD S/P Coronary stents CKD, IV Abnormal troponin I from demand ischemia Morbid obesity HTN Tobacco use disorder  Plan: TEE today r/o aortic dissection, RV function.   LOS: 0 days   Time spent including chart review, lab review, examination, discussion with patient/Nurse : 30 min   Orpah Cobb  MD  11/12/2022, 12:25 PM

## 2022-11-12 NOTE — Plan of Care (Signed)
  Problem: Cardiac: Goal: Ability to achieve and maintain adequate cardiovascular perfusion will improve Outcome: Progressing   Problem: Activity: Goal: Risk for activity intolerance will decrease Outcome: Progressing   Problem: Coping: Goal: Level of anxiety will decrease Outcome: Progressing

## 2022-11-12 NOTE — CV Procedure (Signed)
INDICATIONS:   The patient is 57 years old black female with prolonged chest pain radiating to back had inconclusive MRI chest for aortic dissection evaluation.Marland Kitchen  PROCEDURE:  Informed consent was discussed including risks, benefits and alternatives for the procedure.  Risks include, but are not limited to, cough, sore throat, vomiting, nausea, somnolence, esophageal and stomach trauma or perforation, bleeding, low blood pressure, aspiration, pneumonia, infection, trauma to the teeth and death.    Patient was given propofol sedation.  The oropharynx was anesthetized with topical lidocaine.  The transesophageal probe was inserted in the esophagus and stomach and multiple views were obtained.  Agitated saline was used after the transesophageal probe was removed from the body.  The patient was kept under observation until the patient left the procedure room.  The patient left the procedure room in stable condition.   COMPLICATIONS:  There were no immediate complications.  FINDINGS:  LEFT VENTRICLE: The left ventricle is normal in structure and has mild generalized hypokinesia with LVEF 40-45 %. No thrombus or masses seen in the left ventricle.  RIGHT VENTRICLE:  The right ventricle is normal in structure and function without any thrombus or masses.    LEFT ATRIUM:  The left atrium is normal without any thrombus or masses.  LEFT ATRIAL APPENDAGE:  The left atrial appendage is free of any thrombus or masses.  RIGHT ATRIUM:  The right atrium is free of any thrombus or masses.    ATRIAL SEPTUM:  The atrial septum is normal without any ASD or PFO. Negative sonicated saline injection.  MITRAL VALVE:  The mitral valve is normal in structure and function with mild to moderate multijet regurgitation, no masses, stenosis or vegetations.  TRICUSPID VALVE:  The tricuspid valve is normal in structure and function without regurgitation, masses, stenosis or vegetations.  AORTIC VALVE:  The aortic valve is  normal in structure and function with trivial regurgitation, no masses, stenosis or vegetations.  PULMONIC VALVE:  The pulmonic valve is normal in structure and function without regurgitation, masses, stenosis or vegetations.  AORTIC ARCH, ASCENDING AND DESCENDING AORTA:  The aorta had mild atherosclerosis in the ascending or descending aorta.  The aortic arch was normal.   Superior Vena Cava : No thrombus or catheter.   Pulmonary Veins: Visible.   Pulmonary artery: visible and normal.   IMPRESSION:   No aortic dissection Mild LV systolic dysfunction. Mild to moderate MR Negative ASD or PFO by bubble study.  RECOMMENDATIONS:    Continue medical therapy.Marland Kitchen

## 2022-11-12 NOTE — Progress Notes (Incomplete)
ANTICOAGULATION CONSULT NOTE - Initial Consult  Pharmacy Consult for heparin Indication: chest pain/ACS  Allergies  Allergen Reactions   Sulfa Antibiotics Nausea Only and Rash    Patient Measurements: Height: 5\' 3"  (160 cm) Weight: 99.2 kg (218 lb 11.1 oz) IBW/kg (Calculated) : 52.4 Heparin Dosing Weight: 76.1 kg   Vital Signs: Temp: 98.7 F (37.1 C) (08/06 0407) Temp Source: Oral (08/06 0407) BP: 100/66 (08/06 0407) Pulse Rate: 62 (08/06 0407)  Labs: Recent Labs    11/10/22 2340 11/11/22 0041 11/11/22 1626 11/12/22 0330  HGB 12.0  --   --  11.1*  HCT 37.7  --   --  33.9*  PLT 264  --   --  244  HEPARINUNFRC  --   --   --  0.21*  CREATININE 3.44*  --   --  3.41*  TROPONINIHS 99* 89* 79*  --     Estimated Creatinine Clearance: 19.9 mL/min (A) (by C-G formula based on SCr of 3.41 mg/dL (H)).   Medical History: Past Medical History:  Diagnosis Date   Bipolar disorder (HCC)    Cancer (HCC)    COPD (chronic obstructive pulmonary disease) (HCC)    Coronary artery disease    Diverticulitis    Hypertension    Myocardial infarction [facility])    Seizure (HCC)    Stroke [facility])       Assessment: 57 y/o female presenting with chest pain and a PMH of history of STEMI in 2022. No anticoagulation prior to admission. Pharmacy consulted for heparin.    Heparin level *** is ***therapeutic on 1150units/hr.   Goal of Therapy:  Heparin level 0.3-0.7 units/ml Monitor platelets by anticoagulation protocol: Yes   Plan:  *** Heparin *** units/hr Monitor daily heparin level, CBC, signs/symptoms of bleeding    Alphia Moh, PharmD, BCPS, BCCP Clinical Pharmacist  Please check AMION for all [facility]acy phone numbers After 10:00 PM, call Main Pharmacy [phone]

## 2022-11-12 NOTE — TOC Initial Note (Addendum)
Transition of Care [facility]) - Initial/Assessment Note    Patient Details  Name: Colleen Curtis MRN: [id number] Date of Birth: 07/02/1965  Transition of Care [facility]) CM/SW Contact:    Leone Haven, RN Phone Number: 11/12/2022, 11:08 AM  Clinical Narrative:                 From home with cousin, she has no PCP, will need to set up with a [facility], she does not have any insurance on file will need ast with [employer] Card at dc.  She states she currently does not have any HH services in place , but she does use a cane when ambulating.  She states she will need transport ast with a cab at dc.  Her support system is her cousin. [facility] , can not remember the st.    Expected Discharge Plan: Home/Self Care Barriers to Discharge: Continued Medical Work up   Patient Goals and CMS Choice Patient states their goals for this hospitalization and ongoing recovery are:: return home   Choice offered to / list presented to : NA      Expected Discharge Plan and Services In-house Referral: NA Discharge Planning Services: CM Consult Post Acute Care Choice: NA Living arrangements for the past 2 months: Single Family Home                 DME Arranged: N/A DME Agency: NA       HH Arranged: NA          Prior Living Arrangements/Services Living arrangements for the past 2 months: Single Family Home Lives with:: Relatives (cousin) Patient language and need for interpreter reviewed:: Yes Do you feel safe going back to the place where you live?: Yes      Need for Family Participation in Patient Care: No (Comment) Care giver support system in place?: Yes (comment) Current home services: DME (cane) Criminal Activity/Legal Involvement Pertinent to Current Situation/Hospitalization: No - Comment as needed  Activities of Daily Living      Permission Sought/Granted                  Emotional Assessment   Attitude/Demeanor/Rapport: Engaged Affect  (typically observed): Appropriate Orientation: : Oriented to Self, Oriented to Place, Oriented to Situation, Oriented to  Time Alcohol / Substance Use: Tobacco Use Psych Involvement: No (comment)  Admission diagnosis:  Acute coronary syndrome [facility]) [I24.9] Patient Active Problem List   Diagnosis Date Noted   Intractable nausea and vomiting 10/13/2022   CKD (chronic kidney disease), stage IV (HCC) 10/13/2022   CAD (coronary artery disease) 10/13/2022   Acute CVA (cerebrovascular accident) (HCC) 07/22/2022   Acute coronary syndrome (HCC) 04/04/2020   History of DVT (deep vein thrombosis) 09/10/2019   History of heart artery stent 09/10/2019   History of MI (myocardial infarction) 09/10/2019   History of pulmonary embolism 09/10/2019   Hyperlipidemia 09/10/2019   Hypertension 09/10/2019   Stage 3 chronic kidney disease (HCC) 09/10/2019   PCP:  Pcp, No Pharmacy:   [facility] [address] [place] [address] Phone: [phone] Fax: [phone]  [id number] [place], [place] - [address] [place] [address] Phone: [phone] Fax: [phone]     Social Determinants of Health (SDOH) Social History: SDOH Screenings   Food Insecurity: Food Insecurity Present (07/23/2022)  Housing: Low Risk  (07/23/2022)  Transportation Needs: No Transportation Needs (07/23/2022)  Utilities: Not  At Risk (07/23/2022)  Social Connections: Unknown (08/21/2021)   Received from [facility]  Tobacco Use: High Risk (11/10/2022)   SDOH Interventions:     Readmission Risk Interventions     No data to display

## 2022-11-12 NOTE — Transfer of Care (Signed)
Immediate Anesthesia Transfer of Care Note  Patient: Colleen Curtis  Procedure(s) Performed: TRANSESOPHAGEAL ECHOCARDIOGRAM  Patient Location: Cath Lab  Anesthesia Type:MAC  Level of Consciousness: awake, alert , and oriented  Airway & Oxygen Therapy: Patient Spontanous Breathing and Patient connected to nasal cannula oxygen  Post-op Assessment: Report given to RN and Post -op Vital signs reviewed and stable  Post vital signs: Reviewed and stable  Last Vitals:  Vitals Value Taken Time  BP 112/73   Temp    Pulse 72   Resp 18   SpO2 98     Last Pain:  Vitals:   11/12/22 1303  TempSrc: Temporal  PainSc: 4       Patients Stated Pain Goal: 0 (11/12/22 1303)  Complications: No notable events documented.

## 2022-11-12 NOTE — Anesthesia Postprocedure Evaluation (Signed)
Anesthesia Post Note  Patient: Colleen Curtis  Procedure(s) Performed: TRANSESOPHAGEAL ECHOCARDIOGRAM     Patient location during evaluation: PACU Anesthesia Type: MAC Level of consciousness: awake and alert Pain management: pain level controlled Vital Signs Assessment: post-procedure vital signs reviewed and stable Respiratory status: spontaneous breathing, nonlabored ventilation and respiratory function stable Cardiovascular status: blood pressure returned to baseline Postop Assessment: no apparent nausea or vomiting Anesthetic complications: no   No notable events documented.  Last Vitals:  Vitals:   11/12/22 1403 11/12/22 1404  BP: 112/73   Pulse: 69 69  Resp: 18 18  Temp: 36.6 C   SpO2: 100% 99%    Last Pain:  Vitals:   11/12/22 1403  TempSrc: Temporal  PainSc: 0-No pain                 Shanda Howells

## 2022-11-12 NOTE — Anesthesia Preprocedure Evaluation (Addendum)
Anesthesia Evaluation  Patient identified by MRN, date of birth, ID band Patient awake    Reviewed: Allergy & Precautions, NPO status , Patient's Chart, lab work & pertinent test results, reviewed documented beta blocker date and time   History of Anesthesia Complications Negative for: history of anesthetic complications  Airway Mallampati: II  TM Distance: >3 FB Neck ROM: Full    Dental  (+) Edentulous Lower, Edentulous Upper   Pulmonary COPD, Current Smoker   Pulmonary exam normal        Cardiovascular hypertension, Pt. on medications and Pt. on home beta blockers + CAD and + Past MI  Normal cardiovascular exam  ?aortic dissection   Neuro/Psych Seizures -,     Bipolar Disorder   CVA    GI/Hepatic negative GI ROS, Neg liver ROS,,,  Endo/Other  negative endocrine ROS    Renal/GU Renal InsufficiencyRenal disease  negative genitourinary   Musculoskeletal   Abdominal   Peds  Hematology negative hematology ROS (+)   Anesthesia Other Findings Day of surgery medications reviewed with patient.  Reproductive/Obstetrics negative OB ROS                             Anesthesia Physical Anesthesia Plan  ASA: 4  Anesthesia Plan: MAC   Post-op Pain Management: Minimal or no pain anticipated   Induction:   PONV Risk Score and Plan: 1 and Treatment may vary due to age or medical condition and Propofol infusion  Airway Management Planned: Natural Airway, Nasal Cannula and Simple Face Mask  Additional Equipment: None  Intra-op Plan:   Post-operative Plan:   Informed Consent: I have reviewed the patients History and Physical, chart, labs and discussed the procedure including the risks, benefits and alternatives for the proposed anesthesia with the patient or authorized representative who has indicated his/her understanding and acceptance.       Plan Discussed with: CRNA  Anesthesia Plan  Comments:        Anesthesia Quick Evaluation

## 2022-11-13 ENCOUNTER — Other Ambulatory Visit (HOSPITAL_COMMUNITY): Payer: Self-pay

## 2022-11-13 ENCOUNTER — Encounter (HOSPITAL_COMMUNITY): Payer: Self-pay | Admitting: Cardiovascular Disease

## 2022-11-13 MED ORDER — ONDANSETRON HCL 4 MG PO TABS: 4.0000 mg | ORAL_TABLET | Freq: Every day | ORAL | 1 refills | Status: AC | PRN

## 2022-11-13 MED ORDER — ISOSORBIDE MONONITRATE ER 30 MG PO TB24: 30.0000 mg | ORAL_TABLET | Freq: Every day | ORAL | Status: DC

## 2022-11-13 MED ORDER — ISOSORBIDE MONONITRATE ER 30 MG PO TB24: 30.0000 mg | ORAL_TABLET | Freq: Every day | ORAL | 3 refills | Status: DC

## 2022-11-13 MED ORDER — AMLODIPINE BESYLATE 2.5 MG PO TABS: 2.5000 mg | ORAL_TABLET | Freq: Every day | ORAL | Status: DC

## 2022-11-13 MED ORDER — MECLIZINE HCL 12.5 MG PO TABS: 12.5000 mg | ORAL_TABLET | Freq: Three times a day (TID) | ORAL | 0 refills | Status: DC | PRN

## 2022-11-13 MED ORDER — ENOXAPARIN SODIUM 30 MG/0.3ML IJ SOSY
30.0000 mg | PREFILLED_SYRINGE | INTRAMUSCULAR | Status: DC
  Administered 2022-11-13: 30 mg via SUBCUTANEOUS
  Filled 2022-11-13: qty 0.3

## 2022-11-13 MED ORDER — MECLIZINE HCL 25 MG PO TABS
25.0000 mg | ORAL_TABLET | Freq: Two times a day (BID) | ORAL | 3 refills | Status: DC | PRN
  Filled 2022-11-13: qty 60, 30d supply, fill #0

## 2022-11-13 MED ORDER — AMLODIPINE BESYLATE 2.5 MG PO TABS: 2.5000 mg | ORAL_TABLET | Freq: Every day | ORAL | 2 refills | Status: DC

## 2022-11-13 MED ORDER — NITROGLYCERIN 0.4 MG SL SUBL: 0.4000 mg | SUBLINGUAL_TABLET | SUBLINGUAL | 1 refills | Status: DC | PRN

## 2022-11-13 MED ORDER — AMLODIPINE BESYLATE 2.5 MG PO TABS
2.5000 mg | ORAL_TABLET | Freq: Every day | ORAL | 2 refills | Status: AC
Start: 2022-11-14 — End: ?
  Filled 2022-11-13: qty 30, 30d supply, fill #0

## 2022-11-13 MED ORDER — ISOSORBIDE MONONITRATE ER 30 MG PO TB24
30.0000 mg | ORAL_TABLET | Freq: Every day | ORAL | 3 refills | Status: AC
Start: 2022-11-14 — End: ?
  Filled 2022-11-13: qty 30, 30d supply, fill #0

## 2022-11-13 MED ORDER — NITROGLYCERIN 0.4 MG SL SUBL
0.4000 mg | SUBLINGUAL_TABLET | SUBLINGUAL | 1 refills | Status: AC | PRN
  Filled 2022-11-13: qty 25, 7d supply, fill #0

## 2022-11-13 NOTE — Evaluation (Signed)
Physical Therapy Evaluation Patient Details Name: Colleen Curtis MRN: [id number] DOB: Oct 24, 1965 Today's Date: 11/13/2022  History of Present Illness  Pt is a 57 y.o. female who presented 11/10/22 with chest pain. PMH: STEMI in 2022, HTN, CVA, COPD, cancer, CAD, seizures, Bipolar disorder   Clinical Impression  Pt presents with condition above and deficits mentioned below, see PT Problem List. PTA, she was mod I using a SPC more often than her rollator for functional mobility. 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 She resides with her brother in a 2-level apartment with 4 STE and her bedroom and shower are upstairs. Currently, pt demonstrates deficits in balance and activity tolerance/endurance, limited by pain, fatigue, and dizziness. See Vestibular Assessment - General Observation below in regards to dizziness. She was able to mobilize within the room without physical assistance or LOB while utilizing the RW today. After ambulating a lap within the room, pt returned herself to bed due to fatigue and dizziness. Encouraged pt to get OOB more often to prevent further deconditioning, but pt wanting to rest in bed at this time. Recommending follow-up with OP Vestibular PT. She could also benefit from endurance and balance training. Will continue to follow acutely.    Vestibular Assessment - 11/13/22 0001       Vestibular Assessment   General Observation It was difficult to determine direction of questionable nystagmus but she does endorse dizziness when turning while ambulating and with visual tracking, which is worsened with L Gilberto Better testing. 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 There is mention of dizziness during a recent admission which improved with time and meclizine though, so she may have a peripheral  vestibular dysfunction as well and wouuld benefit from further assessment.      Symptom Behavior   Subjective history of current problem Pt reports dizziness that has been "going on for a while" and reports it began around when she had her CVA.    Type of Dizziness  Spinning    Frequency of Dizziness occasionally    Duration of Dizziness <45 seconds    Symptom Nature Motion provoked;Positional    Aggravating Factors Turning body quickly;Turning head sideways;Moving eyes    Relieving Factors Head stationary;Rest    History of similar episodes yes, in July 2024 and it improved with meclizine      Oculomotor Exam   Oculomotor Alignment Normal    Ocular ROM WFL    Spontaneous Absent    Smooth Pursuits Saccades    Comment dizziness reported and saccades noted with smooth pursuits      Oculomotor Exam-Fixation Suppressed    Left Head Impulse x1-2 corrections, reports improvement in symptoms though    Right Head Impulse x1-2 corrections, reports improvement in symptoms though      Positional Testing   Dix-Hallpike Dix-Hallpike Right;Dix-Hallpike Left      Dix-Hallpike Right   Dix-Hallpike Right Duration none    Dix-Hallpike Right Symptoms No nystagmus      Dix-Hallpike Left   Dix-Hallpike Left Duration <40 seconds    Dix-Hallpike Left Symptoms Upbeat Nystagmus   questionable                 If plan is discharge home, recommend the following: Assistance with cooking/housework;Assist for transportation;Help with stairs or ramp  for entrance   Can travel by private vehicle        Equipment Recommendations None recommended by PT  Recommendations for Other Services       Functional Status Assessment Patient has had a recent decline in their functional status and demonstrates the ability to make significant improvements in function in a reasonable and predictable amount of time.     Precautions / Restrictions Precautions Precautions: Fall Restrictions Weight Bearing  Restrictions: No      Mobility  Bed Mobility Overal bed mobility: Modified Independent             General bed mobility comments: Supine <> sit EOB without assistance, HOB slightly elevated and use of rails noted    Transfers Overall transfer level: Needs assistance Equipment used: Rolling walker (2 wheels) Transfers: Sit to/from Stand Sit to Stand: Supervision           General transfer comment: Pt pulls up to stand on RW with supervision for safety, no LOB    Ambulation/Gait Ambulation/Gait assistance: Contact guard assist Gait Distance (Feet): 35 Feet Assistive device: Rolling walker (2 wheels) Gait Pattern/deviations: Step-through pattern, Decreased stride length Gait velocity: reduced Gait velocity interpretation: <1.8 ft/sec, indicate of risk for recurrent falls   General Gait Details: Pt ambulated a lap within the room before sitting due to fatigue and dizziness. Dizziness began when turning around. No LOB, contact guard for safety  Stairs            Wheelchair Mobility     Tilt Bed    Modified Rankin (Stroke Patients Only)       Balance Overall balance assessment: Needs assistance Sitting-balance support: No upper extremity supported, Feet supported Sitting balance-Leahy Scale: Good Sitting balance - Comments: Able to reach off BOS to manage socks without assistance or LOB   Standing balance support: Bilateral upper extremity supported, During functional activity, Reliant on assistive device for balance Standing balance-Leahy Scale: Poor Standing balance comment: Reliant on RW                             Pertinent Vitals/Pain Pain Assessment Pain Assessment: 0-10 Pain Score: 7  Pain Location: center of chest Pain Descriptors / Indicators: Sharp Pain Intervention(s): Limited activity within patient's tolerance, Monitored during session, Repositioned    Home Living Family/patient expects to be discharged to:: Private  residence Living Arrangements: Other relatives (brother) Available Help at Discharge: Family;Available PRN/intermittently Type of Home: Apartment Home Access: Stairs to enter Entrance Stairs-Rails: Can reach both Entrance Stairs-Number of Steps: 4 Alternate Level Stairs-Number of Steps: flight Home Layout: Two level;1/2 bath on main level;Bed/bath upstairs Home Equipment: Cane - single point;Rollator (4 wheels)      Prior Function Prior Level of Function : Independent/Modified Independent;Driving;Working/employed             Mobility Comments: Uses SPC more often than rollator, no recent falls ADLs Comments: works as a Electrical engineer at [employer] Assessment   Upper Extremity Assessment Upper Extremity Assessment: Right hand dominate;LUE deficits/detail LUE Deficits / Details: reports L deficits from prior CVA but appears [facility] and fairly symmetrical with testing other than mild numbness at forearm per pt report    Lower Extremity Assessment Lower Extremity Assessment: LLE deficits/detail LLE Deficits / Details: reports L deficits from prior CVA but appears [facility] and fairly symmetrical with testing other than mild numbness at knee per pt report  Cervical / Trunk Assessment Cervical / Trunk Assessment: Normal  Communication   Communication Communication: No apparent difficulties  Cognition Arousal: Alert Behavior During Therapy: WFL for tasks assessed/performed Overall Cognitive Status: Within Functional Limits for tasks assessed                                          General Comments General comments (skin integrity, edema, etc.): Provided HEP for vestibular training    Exercises Other Exercises Other Exercises: x1 R Epley maneuver on EOB Other Exercises: seated vestibular training, tracking moving 'A' with eyes while head remaind stationary and then moving head while 'A' remained stationary   Assessment/Plan    PT Assessment  Patient needs continued PT services  PT Problem List Decreased activity tolerance;Decreased mobility;Decreased balance;Cardiopulmonary status limiting activity;Impaired sensation       PT Treatment Interventions DME instruction;Stair training;Gait training;Functional mobility training;Therapeutic activities;Therapeutic exercise;Balance training;Neuromuscular re-education;Cognitive remediation;Patient/family education    PT Goals (Current goals can be found in the Care Plan section)  Acute Rehab PT Goals Patient Stated Goal: to feel better PT Goal Formulation: With patient Time For Goal Achievement: 11/27/22 Potential to Achieve Goals: Good    Frequency Min 1X/week     Co-evaluation               AM-PAC PT "6 Clicks" Mobility  Outcome Measure Help needed turning from your back to your side while in a flat bed without using bedrails?: None Help needed moving from lying on your back to sitting on the side of a flat bed without using bedrails?: None Help needed moving to and from a bed to a chair (including a wheelchair)?: A Little Help needed standing up from a chair using your arms (e.g., wheelchair or bedside chair)?: A Little Help needed to walk in hospital room?: A Little Help needed climbing 3-5 steps with a railing? : A Little 6 Click Score: 20    End of Session Equipment Utilized During Treatment: Gait belt Activity Tolerance: Patient tolerated treatment well Patient left: in bed;with call bell/phone within reach;with bed alarm set   PT Visit Diagnosis: Unsteadiness on feet (R26.81);Other abnormalities of gait and mobility (R26.89);Dizziness and giddiness (R42)    Time: 5409-8119 PT Time Calculation (min) (ACUTE ONLY): 22 min   Charges:   PT Evaluation $PT Eval Moderate Complexity: 1 Mod   PT General Charges $$ ACUTE PT VISIT: 1 Visit         Raymond Gurney, PT, DPT Acute Rehabilitation Services  Office: [phone]   Jewel Baize 11/13/2022, 12:44  PM

## 2022-11-13 NOTE — TOC Transition Note (Addendum)
Transition of Care [facility]) - CM/SW Discharge Note   Patient Details  Name: Colleen Curtis MRN: [id number] Date of Birth: 06/24/1965  Transition of [facility]) CM/SW Contact:  Leone Haven, RN Phone Number: 11/13/2022, 4:09 PM   Clinical Narrative:    Patient Is for dc today, she states she will need ast with cab voucher.  She has follow up apt on [facility].  NCM assisted patient with Match Card for medications also.  NCM sent referral thru epic for outpt PT (vestibular).  Per [facility] pharmacy she has [employer].  This is not listed on her file. NCM called patient informed her that we see she has tricare, she states she does not know what she has.  NCM asked patient if she has the card with her she states it is at home.   In previous conversation with patient she states she has no insurance.  This NCM informed her to call hospital admission back with insurance Card information.  NCM informed admissions of this information, they said they will need the ID number.    Final next level of care: Home/Self Care Barriers to Discharge: Continued Medical Work up   Patient Goals and CMS Choice   Choice offered to / list presented to : NA  Discharge Placement                         Discharge Plan and Services Additional resources added to the After Visit Summary for   In-house Referral: NA Discharge Planning Services: CM Consult Post Acute Care Choice: NA          DME Arranged: N/A DME Agency: NA       HH Arranged: NA          Social Determinants of Health (SDOH) Interventions SDOH Screenings   Food Insecurity: No Food Insecurity (11/12/2022)  Housing: Low Risk  (11/12/2022)  Transportation Needs: No Transportation Needs (11/12/2022)  Utilities: Not At Risk (11/12/2022)  Social Connections: Unknown (08/21/2021)   Received from [facility]  Tobacco Use: High Risk (11/12/2022)     Readmission Risk Interventions     No data to display

## 2022-11-13 NOTE — Plan of Care (Signed)
Pt trained regarding heart disease and pt appears to understand

## 2022-11-13 NOTE — Progress Notes (Signed)
MATCH MEDICATION ASSISTANCE CARD Pharmacies please call [phone] for claim processing assistance.  Rx BIN: [id number] Rx Group: [id number] Rx PCN: [id number] Relationship Code: 1 Person Code: [id number]  Patient ID (MRN): [id number]    Patient Name:Colleen Curtis   Patient DOB:2065-09-08   Discharge Date:11/13/22  Expiration Date:11/21/22 (must be filled within 7 days of discharge)

## 2022-11-13 NOTE — Discharge Summary (Signed)
Physician Discharge Summary  Patient ID: Colleen Curtis MRN: [id number] DOB/AGE: 1965/05/15 57 y.o.  Admit date: 11/10/2022 Discharge date: 11/13/2022  Admission Diagnoses: Acute coronary syndrome CAD S/P coronary stents Chronic CKD Abnormal Troponin I from demand ischemia Morbid obesity Hypertension Tobacco use disorder  Discharge Diagnoses:  Principal Problem:   Chest pain, Aortic dissection ruled out Active problems:   CAD  S/P coronary stents  CKD, IV  Abnormal Troponin I from demand ischemia  Morbid obesity  Hypertension  Hyperlipidemia  Tobacco use disorder  Old stroke  Positional vertigo  Discharged Condition: fair  Hospital Course: 57 years old black female with PMH of CAD, s/p 4 stents placed in [place], uncontrolled HTN, tobacco use disorder, morbid obesity, stroke, seizures and non-obstructive hypertrophic cardiomyopathy had retrosternal chest pain not responding much to morphine,SL NTG and aspirin use. Some of the chest pain was caused by cough but not by palpation. She admitted to dietary and medication non-compliance and had not used her antihypertensive medications regularly. She also has CKD stage IV. Her 11/27/2020 cardiac cath showed in stent restenosis and proximal LCx lesion with stents from LCX to OM branch interfering to access to the lesion.  She had inconclusive MRI of chest for aortic dissection. She had TEE and aortic dissection was ruled out. She will get OP vestibular PT for her balance training. Her BP medications doses were reduced as she had low BP and dizziness.  She was discharged home in stable condition with primary care f/u in 1 week and will see me in 1 month. She will need OP nephrologist visit also.  Consults: cardiology, nephrology, and OP vestibular PT  Significant Diagnostic Studies: labs: Near normal CBC and BMET except creatinine of 3.41 LDL cholesterol of 130 mg. HDL low at 35 mg. Triglycerides were 124 mg. Troponin I were  minimally elevated at 79 and 89 ng.  EKG: SR, Old ASWMI.  Chest x-ray: Unremarkable.  MRI angio chest  Not conclusive for aortic dissection.  TEE:  Negative for aortic dissection.  Negative bubble study for PFO/ASD.  Mild to moderate MR.  Treatments: cardiac meds: Plavix, Imdur, carvedilol, amlodipine, and aspirin. Meclizine for dizziness.  Discharge Exam: Blood pressure 91/66, pulse 63, temperature 98.1 F (36.7 C), temperature source Oral, resp. rate 16, height 5\' 3"  (1.6 m), weight 100.7 kg, SpO2 98%. General appearance: alert, cooperative and appears stated age. Head: Normocephalic, atraumatic. Eyes: Brown eyes, pink conjunctiva, corneas clear.   Neck: No adenopathy, no carotid bruit, no JVD, supple, symmetrical, trachea midline and thyroid not enlarged. Resp: Clear to auscultation bilaterally. Cardio: Regular rate and rhythm, S1, S2 normal, II/VI systolic murmur, no click, rub or gallop. GI: Soft, non-tender; bowel sounds normal; no organomegaly. Extremities: Trace edema, no cyanosis or clubbing. Skin: Warm and dry.  Neurologic: Alert and oriented X 3, normal strength and tone. Normal coordination and slow gait.  Disposition: Discharge disposition: 01-Home or Self Care        Allergies as of 11/13/2022       Reactions   Sulfa Antibiotics Nausea Only, Rash        Medication List     STOP taking these medications    hydrALAZINE 25 MG tablet Commonly known as: APRESOLINE       TAKE these medications    acetaminophen 325 MG tablet Commonly known as: TYLENOL Take 2 tablets (650 mg total) by mouth every 4 (four) hours as needed for headache or mild pain. What changed:  how much to take when  to take this   amLODipine 2.5 MG tablet Commonly known as: NORVASC Take 1 tablet (2.5 mg total) by mouth daily. Start taking on: November 14, 2022 What changed:  medication strength how much to take   aspirin EC 81 MG tablet Take 1 tablet (81 mg total) by mouth  daily. Swallow whole.   atorvastatin 80 MG tablet Commonly known as: LIPITOR Take 1 tablet (80 mg total) by mouth at bedtime.   carvedilol 3.125 MG tablet Commonly known as: COREG Take 1 tablet (3.125 mg total) by mouth 2 (two) times daily with a meal.   clopidogrel 75 MG tablet Commonly known as: PLAVIX Take 1 tablet (75 mg total) by mouth daily.   isosorbide mononitrate 30 MG 24 hr tablet Commonly known as: IMDUR Take 1 tablet (30 mg total) by mouth daily. Start taking on: November 14, 2022 What changed:  medication strength how much to take   meclizine 12.5 MG tablet Commonly known as: ANTIVERT Take 1 tablet (12.5 mg total) by mouth 3 (three) times daily as needed for dizziness.   nitroGLYCERIN 0.4 MG SL tablet Commonly known as: NITROSTAT Place 1 tablet (0.4 mg total) under the tongue every 5 (five) minutes x 3 doses as needed for chest pain.   ondansetron 4 MG tablet Commonly known as: Zofran Take 1 tablet (4 mg total) by mouth daily as needed for nausea or vomiting.        Follow-up Information     [facility] Follow up.   Specialty: Family Medicine Why: GO: AUGUST 21 AT 10:30AM Contact information: [address] [place] [phone]        Orpah Cobb, MD Follow up in 1 month(s).   Specialty: Cardiology Why: As needed Contact information: [address] [place] [address] [phone]                 Time spent: Review of old chart, current chart, lab, x-ray, cardiac tests and discussion with patient over 60 minutes.  Signed: Ricki Rodriguez 11/13/2022, 3:25 PM

## 2022-11-14 ENCOUNTER — Other Ambulatory Visit (HOSPITAL_COMMUNITY): Payer: Self-pay

## 2022-11-21 NOTE — Progress Notes (Signed)
  Cardiology Office Note:  .   Date:  11/22/2022  ID:  Docia Furl, DOB 22-Jun-1965, MRN [id number] PCP: Aviva Kluver  [facility] Providers Cardiologist:  Algie Coffer,   /  Jacobs Golab  Click to update primary MD,subspecialty MD or APP then REFRESH:1}   History of Present Illness: .   Colleen Curtis is a 57 y.o. female with hx of CAD ( stents placed in [place],)  morbid obesity, cigarette smoking, HTN  Has been admitted twice this year by Dr. Algie Coffer  She wants to switch cardiolgists   Admitted with a stroke on April , 2024  Does not appear to have followed up with neuro visits  Was admitted with CP   Has CKD ,  creatinine is 3.4   She is here requesting a note to return to work  No further episodes of CP.   Works as a Electrical engineer   Only exercise is at work  Still smoking ,  advised cessation  Still eats bacon, sausage, hot dogs,     Echo from April 2024  LVEF of 40%, severe concentric LVH Mild MR       ROS:   Studies Reviewed: .         Risk Assessment/Calculations:             Physical Exam:   VS:  BP 130/88   Pulse 75   Ht 5\' 3"  (1.6 m)   Wt 219 lb 9.6 oz (99.6 kg)   SpO2 99%   BMI 38.90 kg/m    Wt Readings from Last 3 Encounters:  11/22/22 219 lb 9.6 oz (99.6 kg)  11/13/22 222 lb 0.1 oz (100.7 kg)  10/13/22 222 lb 11.2 oz (101 kg)    GEN: morbidly obese female ,  in no acute distress NECK: No JVD; No carotid bruits CARDIAC: RRR, no murmurs, rubs, gallops RESPIRATORY:  Clear to auscultation without rales, wheezing or rhonchi  ABDOMEN: Soft, non-tender, non-distended EXTREMITIES:  No edema; No deformity   ASSESSMENT AND PLAN: .     Coronary artery disease: She status post stent placement while in [place].  She is currently on aspirin and Plavix.  She is not having any angina.  2.  Hyperlipidemia: Continue atorvastatin  3.  History of stroke: She does not appear to have made it to any of her neurology follow-up visits.  She remains  on aspirin and Plavix.  Transesophageal echo recently did not reveal any evidence of left atrial appendage and no ASD or PFO.  4.  Chronic kidney disease: Her creatinine is 3.4.  I have made a referral to nephrology.         Dispo: 6 months with APP   Signed, Kristeen Miss, MD

## 2022-11-22 ENCOUNTER — Telehealth (HOSPITAL_BASED_OUTPATIENT_CLINIC_OR_DEPARTMENT_OTHER): Payer: Self-pay | Admitting: Licensed Clinical Social Worker

## 2022-11-22 ENCOUNTER — Encounter: Payer: Self-pay | Admitting: Cardiovascular Disease

## 2022-11-22 ENCOUNTER — Ambulatory Visit: Payer: Self-pay | Attending: Cardiovascular Disease | Admitting: Cardiovascular Disease

## 2022-11-22 VITALS — BP 130/88 | HR 75 | Ht 63.0 in | Wt 219.6 lb

## 2022-11-22 DIAGNOSIS — I639 Cerebral infarction, unspecified: Secondary | ICD-10-CM

## 2022-11-22 DIAGNOSIS — I2510 Atherosclerotic heart disease of native coronary artery without angina pectoris: Secondary | ICD-10-CM

## 2022-11-22 DIAGNOSIS — N184 Chronic kidney disease, stage 4 (severe): Secondary | ICD-10-CM

## 2022-11-22 DIAGNOSIS — I10 Essential (primary) hypertension: Secondary | ICD-10-CM

## 2022-11-22 NOTE — Telephone Encounter (Signed)
H&V Care Navigation CSW Progress Note  Clinical Social Worker contacted patient by phone to f/u on referral for community resources. No answer this morning at [phone]. Left voicemail requesting return call, will re-attempt as able. Pt does appear to have pharmacy benefits and I have contacted the financial counseling office to see if any more information. Pt also has scheduled PCP appt from inpatient stay on 8/21 at [facility].   Patient is participating in a Managed Medicaid Plan:  No, may possibly have commercial insurance  SDOH Screenings   Food Insecurity: No Food Insecurity (11/12/2022)  Housing: Low Risk  (11/12/2022)  Transportation Needs: No Transportation Needs (11/12/2022)  Utilities: Not At Risk (11/12/2022)  Social Connections: Unknown (08/21/2021)   Received from [facility]  Tobacco Use: High Risk (11/22/2022)   Octavio Graves, MSW, LCSW Clinical Social Worker II [facility] Heart/Vascular Care Navigation  [phone]- work cell phone (preferred) [phone]- desk phone

## 2022-11-22 NOTE — Patient Instructions (Addendum)
Medication Instructions:  Your physician recommends that you continue on your current medications as directed. Please refer to the Current Medication list given to you today.  *If you need a refill on your cardiac medications before your next appointment, please call your pharmacy*   Lab Work: NONE If you have labs (blood work) drawn today and your tests are completely normal, you will receive your results only by: MyChart Message (if you have MyChart) OR A paper copy in the mail If you have any lab test that is abnormal or we need to change your treatment, we will call you to review the results.   Testing/Procedures: Ambulatory referral to Nephrology Terrial Rhodes, MD)  Ambulatory referral placed for Social Work Consult   Follow-Up: At [facility], you and your health needs are our priority.  As part of our continuing mission to provide you with exceptional heart care, we have created designated Provider Care Teams.  These Care Teams include your primary Cardiologist (physician) and Advanced Practice Providers (APPs -  Physician Assistants and Nurse Practitioners) who all work together to provide you with the care you need, when you need it.  We recommend signing up for the patient portal called "MyChart".  Sign up information is provided on this After Visit Summary.  MyChart is used to connect with patients for Virtual Visits (Telemedicine).  Patients are able to view lab/test results, encounter notes, upcoming appointments, etc.  Non-urgent messages can be sent to your provider as well.   To learn more about what you can do with MyChart, go to [email or web address].    Your next appointment:   6 month(s)  Provider:   Lazaro Arms, or [place]

## 2022-11-25 ENCOUNTER — Other Ambulatory Visit (HOSPITAL_COMMUNITY): Payer: Self-pay

## 2022-11-25 ENCOUNTER — Telehealth: Payer: Self-pay | Admitting: Licensed Clinical Social Worker

## 2022-11-25 NOTE — Telephone Encounter (Signed)
H&V Care Navigation CSW Progress Note  Clinical Social Worker  mailed my card, Arts administrator and Nutrition Services card from [employer] for food stamp app, Pension scheme manager and Medicaid flyer  to pt for review.  Patient is participating in a Managed Medicaid Plan:  No, possibly commercial or Tricare only  SDOH Screenings   Food Insecurity: No Food Insecurity (11/12/2022)  Housing: Low Risk  (11/12/2022)  Transportation Needs: No Transportation Needs (11/12/2022)  Utilities: Not At Risk (11/12/2022)  Social Connections: Unknown (08/21/2021)   Received from [facility]  Tobacco Use: High Risk (11/22/2022)    Octavio Graves, MSW, LCSW Clinical Social Worker II [facility] Heart/Vascular Care Navigation  [phone]- work cell phone (preferred) [phone]- desk phone

## 2022-11-25 NOTE — Telephone Encounter (Signed)
H&V Care Navigation CSW Progress Note  Clinical Social Worker contacted patient by phone to f/u on referral for community resources. No answer this afternoon at [phone], unable to leave a voicemail today. Have reached out to financial counseling team and prior auth team to see if pharmacy benefits on file are active. I will mail my card to home address. Will re-attempt again as DPR states pt only wants calls placed to her at this time.  Patient is participating in a Managed Medicaid Plan:  No, appears to have commercial plan  SDOH Screenings   Food Insecurity: No Food Insecurity (11/12/2022)  Housing: Low Risk  (11/12/2022)  Transportation Needs: No Transportation Needs (11/12/2022)  Utilities: Not At Risk (11/12/2022)  Social Connections: Unknown (08/21/2021)   Received from [facility]  Tobacco Use: High Risk (11/22/2022)    Octavio Graves, MSW, LCSW Clinical Social Worker II [facility] Heart/Vascular Care Navigation  [phone]- work cell phone (preferred) [phone]- desk phone

## 2022-11-25 NOTE — Telephone Encounter (Signed)
Pharmacy Patient Advocate Encounter  Insurance verification completed.   The patient is insured through  [employer]  .

## 2022-11-27 ENCOUNTER — Inpatient Hospital Stay (INDEPENDENT_AMBULATORY_CARE_PROVIDER_SITE_OTHER): Payer: Self-pay | Admitting: Primary Care

## 2022-11-28 ENCOUNTER — Telehealth: Payer: Self-pay | Admitting: Licensed Clinical Social Worker

## 2022-11-28 NOTE — Telephone Encounter (Signed)
H&V Care Navigation CSW Progress Note  Clinical Social Worker contacted patient by phone to f/u on referral for community resources. No answer this morning for a third time at [phone], unable to leave a voicemail today as number is now not working.  I called pt brother Cornellius Stumpe, [phone]. He shares that pt phone is off and he has to use messenger to call her. I asked if he would give my number to pt to reach out. He will do so, texted it to his phone. No medical information given as no DPR on file.   I remain available should pt reach out, will try one more time at the beginning of the month to see if phone benefits are back on.   SDOH Screenings   Food Insecurity: No Food Insecurity (11/12/2022)  Housing: Low Risk  (11/12/2022)  Transportation Needs: No Transportation Needs (11/12/2022)  Utilities: Not At Risk (11/12/2022)  Social Connections: Unknown (08/21/2021)   Received from [facility]  Tobacco Use: High Risk (11/22/2022)    Octavio Graves, MSW, LCSW Clinical Social Worker II [facility] Heart/Vascular Care Navigation  [phone]- work cell phone (preferred) [phone]- desk phone

## 2022-12-03 ENCOUNTER — Observation Stay (HOSPITAL_COMMUNITY)
Admission: EM | Admit: 2022-12-03 | Discharge: 2022-12-05 | Disposition: A | Discharge status: Home / Self Care | Payer: Self-pay | Attending: Internal Medicine | Admitting: Internal Medicine

## 2022-12-03 ENCOUNTER — Emergency Department (HOSPITAL_COMMUNITY): Payer: Self-pay

## 2022-12-03 DIAGNOSIS — I5022 Chronic systolic (congestive) heart failure: Secondary | ICD-10-CM | POA: Insufficient documentation

## 2022-12-03 DIAGNOSIS — I2510 Atherosclerotic heart disease of native coronary artery without angina pectoris: Secondary | ICD-10-CM | POA: Insufficient documentation

## 2022-12-03 DIAGNOSIS — Z8673 Personal history of transient ischemic attack (TIA), and cerebral infarction without residual deficits: Secondary | ICD-10-CM | POA: Insufficient documentation

## 2022-12-03 DIAGNOSIS — Z7982 Long term (current) use of aspirin: Secondary | ICD-10-CM | POA: Insufficient documentation

## 2022-12-03 DIAGNOSIS — Z859 Personal history of malignant neoplasm, unspecified: Secondary | ICD-10-CM | POA: Insufficient documentation

## 2022-12-03 DIAGNOSIS — F17290 Nicotine dependence, other tobacco product, uncomplicated: Secondary | ICD-10-CM | POA: Insufficient documentation

## 2022-12-03 DIAGNOSIS — R0602 Shortness of breath: Secondary | ICD-10-CM | POA: Insufficient documentation

## 2022-12-03 DIAGNOSIS — I130 Hypertensive heart and chronic kidney disease with heart failure and stage 1 through stage 4 chronic kidney disease, or unspecified chronic kidney disease: Secondary | ICD-10-CM | POA: Insufficient documentation

## 2022-12-03 DIAGNOSIS — R0789 Other chest pain: Principal | ICD-10-CM | POA: Insufficient documentation

## 2022-12-03 DIAGNOSIS — N184 Chronic kidney disease, stage 4 (severe): Secondary | ICD-10-CM | POA: Insufficient documentation

## 2022-12-03 DIAGNOSIS — Z79899 Other long term (current) drug therapy: Secondary | ICD-10-CM | POA: Insufficient documentation

## 2022-12-03 DIAGNOSIS — R079 Chest pain, unspecified: Principal | ICD-10-CM | POA: Diagnosis present

## 2022-12-03 DIAGNOSIS — J449 Chronic obstructive pulmonary disease, unspecified: Secondary | ICD-10-CM | POA: Insufficient documentation

## 2022-12-03 LAB — COMPREHENSIVE METABOLIC PANEL
ALT: 14 U/L (ref 0–44)
AST: 14 U/L — ABNORMAL LOW (ref 15–41)
Albumin: 3.2 g/dL — ABNORMAL LOW (ref 3.5–5.0)
Alkaline Phosphatase: 84 U/L (ref 38–126)
Anion gap: 11 (ref 5–15)
BUN: 34 mg/dL — ABNORMAL HIGH (ref 6–20)
CO2: 16 mmol/L — ABNORMAL LOW (ref 22–32)
Calcium: 8.3 mg/dL — ABNORMAL LOW (ref 8.9–10.3)
Chloride: 111 mmol/L (ref 98–111)
Creatinine, Ser: 3.84 mg/dL — ABNORMAL HIGH (ref 0.44–1.00)
GFR, Estimated: 13 mL/min — ABNORMAL LOW (ref >60)
Glucose, Bld: 94 mg/dL (ref 70–99)
Potassium: 3.4 mmol/L — ABNORMAL LOW (ref 3.5–5.1)
Sodium: 138 mmol/L (ref 135–145)
Total Bilirubin: 0.3 mg/dL (ref 0.3–1.2)
Total Protein: 6.4 g/dL — ABNORMAL LOW (ref 6.5–8.1)

## 2022-12-03 LAB — CBC WITH DIFFERENTIAL/PLATELET
Abs Immature Granulocytes: 0.05 10*3/uL (ref 0.00–0.07)
Basophils Absolute: 0.1 10*3/uL (ref 0.0–0.1)
Basophils Relative: 1 %
Eosinophils Absolute: 0.3 10*3/uL (ref 0.0–0.5)
Eosinophils Relative: 3 %
HCT: 37.1 % (ref 36.0–46.0)
Hemoglobin: 12.1 g/dL (ref 12.0–15.0)
Immature Granulocytes: 1 %
Lymphocytes Relative: 25 %
Lymphs Abs: 2.6 10*3/uL (ref 0.7–4.0)
MCH: 26.2 pg (ref 26.0–34.0)
MCHC: 32.6 g/dL (ref 30.0–36.0)
MCV: 80.5 fL (ref 80.0–100.0)
Monocytes Absolute: 0.7 10*3/uL (ref 0.1–1.0)
Monocytes Relative: 6 %
Neutro Abs: 7 10*3/uL (ref 1.7–7.7)
Neutrophils Relative %: 64 %
Platelets: 259 10*3/uL (ref 150–400)
RBC: 4.61 MIL/uL (ref 3.87–5.11)
RDW: 15.1 % (ref 11.5–15.5)
WBC: 10.7 10*3/uL — ABNORMAL HIGH (ref 4.0–10.5)
nRBC: 0 % (ref 0.0–0.2)

## 2022-12-03 LAB — URINALYSIS, ROUTINE W REFLEX MICROSCOPIC
Bilirubin Urine: NEGATIVE
Glucose, UA: NEGATIVE mg/dL
Ketones, ur: NEGATIVE mg/dL
Leukocytes,Ua: NEGATIVE
Nitrite: NEGATIVE
Protein, ur: 100 mg/dL — AB
Specific Gravity, Urine: 1.015 (ref 1.005–1.030)
pH: 5 (ref 5.0–8.0)

## 2022-12-03 LAB — TROPONIN I (HIGH SENSITIVITY)
Troponin I (High Sensitivity): 53 ng/L — ABNORMAL HIGH (ref <18)
Troponin I (High Sensitivity): 48 ng/L — ABNORMAL HIGH (ref <18)

## 2022-12-03 LAB — BRAIN NATRIURETIC PEPTIDE: B Natriuretic Peptide: 137.8 pg/mL — ABNORMAL HIGH (ref 0.0–100.0)

## 2022-12-03 MED ORDER — LACTATED RINGERS IV BOLUS
500.0000 mL | Freq: Once | INTRAVENOUS | Status: AC
  Administered 2022-12-04: 500 mL via INTRAVENOUS

## 2022-12-03 MED ORDER — NITROGLYCERIN 0.4 MG SL SUBL
0.4000 mg | SUBLINGUAL_TABLET | SUBLINGUAL | Status: DC | PRN
  Administered 2022-12-03 (×3): 0.4 mg via SUBLINGUAL
  Filled 2022-12-03 (×3): qty 1

## 2022-12-03 MED ORDER — MORPHINE SULFATE (PF) 4 MG/ML IV SOLN
4.0000 mg | Freq: Once | INTRAVENOUS | Status: AC
  Administered 2022-12-03: 4 mg via INTRAVENOUS
  Filled 2022-12-03: qty 1

## 2022-12-03 MED ORDER — LEVETIRACETAM IN NACL 1000 MG/100ML IV SOLN
2000.0000 mg | Freq: Once | INTRAVENOUS | Status: AC
  Administered 2022-12-03: 2000 mg via INTRAVENOUS
  Filled 2022-12-03: qty 200

## 2022-12-03 NOTE — ED Provider Notes (Signed)
[place] EMERGENCY DEPARTMENT AT [facility] Provider Note   CSN: [id number] Arrival date & time: 12/03/22  1825     History  Chief Complaint  Patient presents with   Chest Pain    Colleen Curtis is a 57 y.o. female.   Chest Pain 57 year old female history of CAD status post 4 stents placed in [place], hypertension, tobacco use, obesity, seizures, hypertrophic cardiomyopathy presenting for chest pain.  She states this morning with no precipitant she developed left-sided stabbing chest pain.  Nonpleuritic, some shortness of breath.  Similar to prior MI.  No back pain or radiation to her back.  No weakness or numbness, occasional nausea.  She is given nitro with EMS as well as aspirin with some improvement.  She also reports history of seizure, and states she has not been taking her phenobarbital for several months.  Last seizure about a year ago.  Per chart review she was admitted August here after presenting for chest pain.  Most recent cath appears to be 2022 which shows stent restenosis and proximal left circumflex lesions that could not be intervened on.  She was discharged home.  She has been taking aspirin and Plavix.     Home Medications Prior to Admission medications   Medication Sig Start Date End Date Taking? Authorizing Provider  acetaminophen (TYLENOL) 325 MG tablet Take 2 tablets (650 mg total) by mouth every 4 (four) hours as needed for headache or mild pain. Patient taking differently: Take 3 tablets by mouth 2 (two) times daily as needed for headache or mild pain. 11/29/20  Yes Orpah Cobb, MD  amLODipine (NORVASC) 2.5 MG tablet Take 1 tablet (2.5 mg total) by mouth daily. 11/14/22  Yes Orpah Cobb, MD  aspirin EC 81 MG tablet Take 1 tablet (81 mg total) by mouth daily. Swallow whole. Patient taking differently: Take 81 mg by mouth daily. 09/25/22  Yes Gloris Manchester, MD  carvedilol (COREG) 3.125 MG tablet Take 1 tablet (3.125 mg total) by mouth 2 (two)  times daily with a meal. 09/25/22  Yes Elmer Picker, NP  isosorbide mononitrate (IMDUR) 30 MG 24 hr tablet Take 1 tablet (30 mg total) by mouth daily. 11/14/22  Yes Orpah Cobb, MD  meclizine (ANTIVERT) 25 MG tablet Take 1 tablet (25 mg total) by mouth 2 (two) times daily as needed for dizziness. 11/13/22  Yes Orpah Cobb, MD  nitroGLYCERIN (NITROSTAT) 0.4 MG SL tablet Place 1 tablet (0.4 mg total) under the tongue every 5 (five) minutes x 3 doses as needed for chest pain. 11/13/22  Yes Orpah Cobb, MD  ondansetron (ZOFRAN) 4 MG tablet Take 1 tablet (4 mg total) by mouth daily as needed for nausea or vomiting. 11/13/22 11/13/23 Yes Orpah Cobb, MD      Allergies    Sulfa antibiotics    Review of Systems   Review of Systems  Cardiovascular:  Positive for chest pain.  Review of systems completed and notable as per HPI.  ROS otherwise negative.   Physical Exam Updated Vital Signs BP (!) 132/99   Pulse 65   Temp 98.6 F (37 C) (Oral)   Resp 19   SpO2 100%  Physical Exam Vitals and nursing note reviewed.  Constitutional:      General: She is not in acute distress.    Appearance: She is well-developed.  HENT:     Head: Normocephalic and atraumatic.  Eyes:     Conjunctiva/sclera: Conjunctivae normal.  Cardiovascular:     Rate  and Rhythm: Normal rate and regular rhythm.     Pulses:          Radial pulses are 2+ on the right side and 2+ on the left side.       Dorsalis pedis pulses are 2+ on the right side and 2+ on the left side.     Heart sounds: No murmur heard. Pulmonary:     Effort: Pulmonary effort is normal. No respiratory distress.     Breath sounds: Normal breath sounds.  Chest:     Chest wall: No tenderness.  Abdominal:     Palpations: Abdomen is soft.     Tenderness: There is no abdominal tenderness.  Musculoskeletal:        General: No swelling.     Cervical back: Neck supple.     Right lower leg: No edema.     Left lower leg: No edema.  Skin:    General: Skin is  warm and dry.     Capillary Refill: Capillary refill takes less than 2 seconds.  Neurological:     General: No focal deficit present.     Mental Status: She is alert and oriented to person, place, and time.  Psychiatric:        Mood and Affect: Mood normal.     ED Results / Procedures / Treatments   Labs (all labs ordered are listed, but only abnormal results are displayed) Labs Reviewed  COMPREHENSIVE METABOLIC PANEL - Abnormal; Notable for the following components:      Result Value   Potassium 3.4 (*)    CO2 16 (*)    BUN 34 (*)    Creatinine, Ser 3.84 (*)    Calcium 8.3 (*)    Total Protein 6.4 (*)    Albumin 3.2 (*)    AST 14 (*)    GFR, Estimated 13 (*)    All other components within normal limits  CBC WITH DIFFERENTIAL/PLATELET - Abnormal; Notable for the following components:   WBC 10.7 (*)    All other components within normal limits  URINALYSIS, ROUTINE W REFLEX MICROSCOPIC - Abnormal; Notable for the following components:   APPearance CLOUDY (*)    Hgb urine dipstick SMALL (*)    Protein, ur 100 (*)    Bacteria, UA FEW (*)    All other components within normal limits  BRAIN NATRIURETIC PEPTIDE - Abnormal; Notable for the following components:   B Natriuretic Peptide 137.8 (*)    All other components within normal limits  TROPONIN I (HIGH SENSITIVITY) - Abnormal; Notable for the following components:   Troponin I (High Sensitivity) 53 (*)    All other components within normal limits  TROPONIN I (HIGH SENSITIVITY) - Abnormal; Notable for the following components:   Troponin I (High Sensitivity) 48 (*)    All other components within normal limits    EKG EKG Interpretation Date/Time:  Tuesday December 03 2022 18:37:50 EDT Ventricular Rate:  86 PR Interval:  188 QRS Duration:  106 QT Interval:  396 QTC Calculation: 474 R Axis:   56  Text Interpretation: Sinus rhythm Probable left atrial enlargement Confirmed by Fulton Reek [phone]) on 12/03/2022 6:40:18  PM  Radiology DG Chest Port 1 View  Result Date: 12/03/2022 CLINICAL DATA:  Chest pain and seizures. EXAM: PORTABLE CHEST 1 VIEW COMPARISON:  November 10, 2022 FINDINGS: The cardiac silhouette is mildly enlarged and unchanged in size. Coronary artery stents are seen. Mild, diffusely increased lung markings are present with very mild  atelectasis noted within the right lung base and left perihilar region. No pleural effusion or pneumothorax is identified. Surgical clips are seen within the right upper quadrant. A radiopaque fusion plate and screws are seen overlying the cervical spine. The visualized skeletal structures are unremarkable. IMPRESSION: Mild cardiomegaly with very mild right basilar and left perihilar atelectasis. Electronically Signed   By: Aram Candela M.D.   On: 12/03/2022 19:50    Procedures Procedures    Medications Ordered in ED Medications  nitroGLYCERIN (NITROSTAT) SL tablet 0.4 mg (0.4 mg Sublingual Given 12/03/22 2238)  lactated ringers bolus 500 mL (has no administration in time range)  levETIRAcetam (KEPPRA) IVPB 1000 mg/100 mL premix (0 mg Intravenous Stopped 12/03/22 1935)  morphine (PF) 4 MG/ML injection 4 mg (4 mg Intravenous Given 12/03/22 2130)    ED Course/ Medical Decision Making/ A&P Clinical Course as of 12/04/22 0001  Tue Dec 03, 2022  2252 Cardiology: low suspicion for ACS [JD]    Clinical Course User Index [JD] Laurence Spates, MD                                 Medical Decision Making Amount and/or Complexity of Data Reviewed Labs: ordered. Radiology: ordered.  Risk Prescription drug management. Decision regarding hospitalization.   Medical Decision Making:   Colleen Curtis is a 57 y.o. female who presented to the ED today with chest pain.  Vital signs reviewed.  EKG shows no significant change from prior no signs of acute ischemia.  Will evaluate for ACS.  Based on description and similar to prior MI low suspicion for dissection.   She has no hypoxia, tachycardia, pleuritic pain, low concern for PE.  I was called back to bedside due to concern for seizure.  Patient reportedly had a brief episode of unresponsiveness.  On arrival she does not remember the event although is awake and alert.  She does report history of seizures not been taking her seizure medications.  No neurologic deficits or head trauma.  Patient was loaded with 20 mg/kg.  Keppra as she used to take this, no additional seizure-like activity.  I reviewed her chart, her last cath appeared to be in June 2021 in [place].  She had a 90% proximal circumflex lesion and first marginal lesion which were stented.  Patient placed on continuous vitals and telemetry monitoring while in ED which was reviewed periodically.  Reviewed and confirmed nursing documentation for past medical history, family history, social history.  Reassessment and Plan:   Troponin mildly elevated although stable.  Improved from most recent admission.  However she has persistent chest pain despite morphine, nitro.  I am concerned for some component of angina.  I discussed with Dr. Geraldo Pitter with cardiology who low suspicion for acute coronary syndrome.  Felt medicine admission was reasonable for observation.  Work was otherwise notable for significant non-anion gap metabolic acidosis as well as mild AKI although acidosis improved from prior.  Mild leukocytosis as well.  Chest x-ray is unremarkable.  Discussed with [facility] hospitalist team and patient admitted.  Patient amenable to this plan.   Patient's presentation is most consistent with acute presentation with potential threat to life or bodily function.           Final Clinical Impression(s) / ED Diagnoses Final diagnoses:  Chest pain, unspecified type    Rx / DC Orders ED Discharge Orders     None  Laurence Spates, MD 12/04/22 0001

## 2022-12-03 NOTE — ED Notes (Signed)
Pt began having seizure that lasted about 30 seconds. Pt's eyes rolled back and pt began shaking. MD brought to bedside.

## 2022-12-03 NOTE — ED Triage Notes (Signed)
Pt to ED via EMS from gas station. Pt states she was getting some "junk" food at the gas station, but prior to eating the food, pt reports developing sharp central chest pain radiating to left arm. Pt has hx of MI 5 months ago and states pain today feels the same. Pt denies N/V but does endorse SOB and dizziness. EMS gave 324 of ASA and 1 nitroglycerin en route with no relief.    EMS Vitals: 152/102 94 HR 100% RA 107 CBG 18 RR

## 2022-12-03 NOTE — ED Notes (Signed)
Pt stated she feels like she is going to have a seizure. MD at bedside.

## 2022-12-03 NOTE — ED Notes (Signed)
PT awake now and slow to orient, PT endorses extreme anxiety due to having to relocate to another state. PT also seems fearful due to not remembering seizure activity.

## 2022-12-03 NOTE — ED Notes (Signed)
While xray was being completed PT had a second seizure lasting approx 45 seconds. PT's eyes rolled back in her head, PT was unresponsive to both verbal and painful stimuli, PT's upper body and legs were both shaking. MD brought to bedside.

## 2022-12-04 ENCOUNTER — Other Ambulatory Visit: Payer: Self-pay

## 2022-12-04 ENCOUNTER — Encounter (HOSPITAL_COMMUNITY): Payer: Self-pay | Admitting: Family Medicine

## 2022-12-04 ENCOUNTER — Observation Stay (HOSPITAL_COMMUNITY): Payer: Self-pay

## 2022-12-04 DIAGNOSIS — N184 Chronic kidney disease, stage 4 (severe): Secondary | ICD-10-CM

## 2022-12-04 DIAGNOSIS — R0789 Other chest pain: Secondary | ICD-10-CM

## 2022-12-04 DIAGNOSIS — R569 Unspecified convulsions: Secondary | ICD-10-CM

## 2022-12-04 DIAGNOSIS — Z8673 Personal history of transient ischemic attack (TIA), and cerebral infarction without residual deficits: Secondary | ICD-10-CM

## 2022-12-04 DIAGNOSIS — Z9861 Coronary angioplasty status: Secondary | ICD-10-CM

## 2022-12-04 DIAGNOSIS — I10 Essential (primary) hypertension: Secondary | ICD-10-CM

## 2022-12-04 DIAGNOSIS — I5020 Unspecified systolic (congestive) heart failure: Secondary | ICD-10-CM

## 2022-12-04 DIAGNOSIS — R079 Chest pain, unspecified: Secondary | ICD-10-CM | POA: Diagnosis present

## 2022-12-04 DIAGNOSIS — I2510 Atherosclerotic heart disease of native coronary artery without angina pectoris: Secondary | ICD-10-CM

## 2022-12-04 LAB — CBC
HCT: 36.2 % (ref 36.0–46.0)
HCT: 35 % — ABNORMAL LOW (ref 36.0–46.0)
Hemoglobin: 11.5 g/dL — ABNORMAL LOW (ref 12.0–15.0)
Hemoglobin: 11.7 g/dL — ABNORMAL LOW (ref 12.0–15.0)
MCH: 26.3 pg (ref 26.0–34.0)
MCH: 27 pg (ref 26.0–34.0)
MCHC: 31.8 g/dL (ref 30.0–36.0)
MCHC: 33.4 g/dL (ref 30.0–36.0)
MCV: 82.6 fL (ref 80.0–100.0)
MCV: 80.6 fL (ref 80.0–100.0)
Platelets: 217 10*3/uL (ref 150–400)
Platelets: 227 10*3/uL (ref 150–400)
RBC: 4.38 MIL/uL (ref 3.87–5.11)
RBC: 4.34 MIL/uL (ref 3.87–5.11)
RDW: 15.2 % (ref 11.5–15.5)
RDW: 15.2 % (ref 11.5–15.5)
WBC: 9.5 10*3/uL (ref 4.0–10.5)
WBC: 8.6 10*3/uL (ref 4.0–10.5)
nRBC: 0 % (ref 0.0–0.2)
nRBC: 0 % (ref 0.0–0.2)

## 2022-12-04 LAB — CREATININE, SERUM
Creatinine, Ser: 3.68 mg/dL — ABNORMAL HIGH (ref 0.44–1.00)
GFR, Estimated: 14 mL/min — ABNORMAL LOW (ref >60)

## 2022-12-04 LAB — BASIC METABOLIC PANEL
Anion gap: 7 (ref 5–15)
BUN: 35 mg/dL — ABNORMAL HIGH (ref 6–20)
CO2: 16 mmol/L — ABNORMAL LOW (ref 22–32)
Calcium: 8 mg/dL — ABNORMAL LOW (ref 8.9–10.3)
Chloride: 114 mmol/L — ABNORMAL HIGH (ref 98–111)
Creatinine, Ser: 3.76 mg/dL — ABNORMAL HIGH (ref 0.44–1.00)
GFR, Estimated: 13 mL/min — ABNORMAL LOW (ref >60)
Glucose, Bld: 90 mg/dL (ref 70–99)
Potassium: 3.5 mmol/L (ref 3.5–5.1)
Sodium: 137 mmol/L (ref 135–145)

## 2022-12-04 LAB — MAGNESIUM: Magnesium: 1.5 mg/dL — ABNORMAL LOW (ref 1.7–2.4)

## 2022-12-04 MED ORDER — ACETAMINOPHEN 325 MG PO TABS: 650.0000 mg | ORAL_TABLET | Freq: Four times a day (QID) | ORAL | Status: DC | PRN

## 2022-12-04 MED ORDER — OXYCODONE HCL 5 MG PO TABS: 5.0000 mg | ORAL_TABLET | ORAL | Status: DC | PRN

## 2022-12-04 MED ORDER — LEVETIRACETAM 500 MG PO TABS
500.0000 mg | ORAL_TABLET | Freq: Two times a day (BID) | ORAL | Status: DC
  Administered 2022-12-04 (×2): 500 mg via ORAL
  Filled 2022-12-04 (×2): qty 1

## 2022-12-04 MED ORDER — ENOXAPARIN SODIUM 30 MG/0.3ML IJ SOSY
30.0000 mg | PREFILLED_SYRINGE | INTRAMUSCULAR | Status: DC
  Administered 2022-12-04: 30 mg via SUBCUTANEOUS
  Filled 2022-12-04: qty 0.3

## 2022-12-04 MED ORDER — SODIUM CHLORIDE 0.9% FLUSH
3.0000 mL | Freq: Two times a day (BID) | INTRAVENOUS | Status: DC
  Administered 2022-12-04 – 2022-12-05 (×2): 3 mL via INTRAVENOUS

## 2022-12-04 MED ORDER — OXCARBAZEPINE 150 MG PO TABS
150.0000 mg | ORAL_TABLET | Freq: Two times a day (BID) | ORAL | Status: DC
  Administered 2022-12-04 – 2022-12-05 (×2): 150 mg via ORAL
  Filled 2022-12-04 (×3): qty 1

## 2022-12-04 MED ORDER — MAGNESIUM SULFATE 2 GM/50ML IV SOLN
2.0000 g | Freq: Once | INTRAVENOUS | Status: AC
  Administered 2022-12-04: 2 g via INTRAVENOUS
  Filled 2022-12-04: qty 50

## 2022-12-04 NOTE — Consult Note (Addendum)
Cardiology Consultation   Patient ID: [name] MRN: [id number]; DOB: 29-Oct-1965  Admit date: 12/03/2022 Date of Consult: 12/04/2022  PCP:  Aviva Kluver   [facility] Providers Cardiologist:  Kristeen Miss, MD     Patient Profile:   Colleen Curtis is a 57 y.o. female with a hx of CAD s/p 4 stents while in [place], obesity, tobacco use, HTN, CKD IV, CVA and seizures who is being seen 12/04/2022 for the evaluation of chest pain at the request of Dr. Earlene Plater.  History of Present Illness:   Colleen Curtis is a 57 yo female with PMH noted above. She has previously followed by Dr. Algie Coffer as an outpatient and recently transitioned to Dr. Elease Hashimoto.  Underwent cardiac catheterization 11/2020 with Dr. Algie Coffer that showed patent stent in LAD, RCA, OM1 with culprit lesion for symptoms being circumflex that was jailed by overlapping stents from proximal circumflex to OM1.  She was continued on medical therapy.  She was admitted earlier this month chest pain.  Underwent MRI of the chest for aortic dissection which was inconclusive.  Ultimately underwent TEE and aortic dissection was ruled out.  LVEF of 40 to 45%, global hypokinesis, normal RV, mild to moderate MR. She had episodes of hypotension and dizziness during that admission and her blood pressure medications were reduced.  He was recommended that she follow-up with her outpatient nephrologist.  Seen by Dr. Elease Hashimoto on 8/16 and denied any angina.  She was continued on her home medications without further adjustment.  She was referred to nephrology.  Presented to the ED on 8/27 with complaints of chest pain and seizure activity.  Reports she was at a gas station yesterday afternoon getting junk food when she developed centralized sharp chest pain.  On EMS arrival she was given aspirin and 1 sublingual nitroglycerin. Labs in the ED showed sodium 138, potassium 3.4, creatinine 3.84, BNP 137, high-sensitivity troponin 53>>48, WBC 10.7,  hemoglobin 12.1.  EKG sinus rhythm 86 bpm, no ST/T wave abnormalities.  While in the ED reportedly had 2 episodes of witnessed seizure-like activity with postictal phases.  She was admitted internal medicine for further management.  In talking with patient she states that she has frequent episodes of chest pain but not associated with rest or exertion.  Pain is also reproducible with palpation, often comes with deep breathing.  Does not appear that she is fully compliant with her medications, states she stretches out her meds not taking them on a daily basis.  Not follow-up with the PCP on a regular basis.   Past Medical History:  Diagnosis Date   Bipolar disorder (HCC)    Cancer (HCC)    COPD (chronic obstructive pulmonary disease) (HCC)    Coronary artery disease    Diverticulitis    Hypertension    Myocardial infarction (HCC)    Seizure (HCC)    Stroke [facility])     Past Surgical History:  Procedure Laterality Date   ABDOMINAL SURGERY     BRAIN SURGERY     CHOLECYSTECTOMY     LEFT HEART CATH AND CORONARY ANGIOGRAPHY N/A 11/27/2020   Procedure: LEFT HEART CATH AND CORONARY ANGIOGRAPHY;  Surgeon: Orpah Cobb, MD;  Location: [facility] INV[facility];  Service: Cardiovascular;  Laterality: N/A;   TEE WITHOUT CARDIOVERSION N/A 11/12/2022   Procedure: TRANSESOPHAGEAL ECHOCARDIOGRAM;  Surgeon: Orpah Cobb, MD;  Location: [facility] INVASIVE CV LAB;  Service: Cardiovascular;  Laterality: N/A;     Home Medications:  Prior to Admission medications  Medication Sig Start Date End Date Taking? Authorizing Provider  acetaminophen (TYLENOL) 325 MG tablet Take 2 tablets (650 mg total) by mouth every 4 (four) hours as needed for headache or mild pain. Patient taking differently: Take 3 tablets by mouth 2 (two) times daily as needed for headache or mild pain. 11/29/20  Yes Orpah Cobb, MD  amLODipine (NORVASC) 2.5 MG tablet Take 1 tablet (2.5 mg total) by mouth daily. 11/14/22  Yes Orpah Cobb, MD  aspirin EC  81 MG tablet Take 1 tablet (81 mg total) by mouth daily. Swallow whole. Patient taking differently: Take 81 mg by mouth daily. 09/25/22  Yes Gloris Manchester, MD  carvedilol (COREG) 3.125 MG tablet Take 1 tablet (3.125 mg total) by mouth 2 (two) times daily with a meal. 09/25/22  Yes Elmer Picker, NP  isosorbide mononitrate (IMDUR) 30 MG 24 hr tablet Take 1 tablet (30 mg total) by mouth daily. 11/14/22  Yes Orpah Cobb, MD  meclizine (ANTIVERT) 25 MG tablet Take 1 tablet (25 mg total) by mouth 2 (two) times daily as needed for dizziness. 11/13/22  Yes Orpah Cobb, MD  nitroGLYCERIN (NITROSTAT) 0.4 MG SL tablet Place 1 tablet (0.4 mg total) under the tongue every 5 (five) minutes x 3 doses as needed for chest pain. 11/13/22  Yes Orpah Cobb, MD  ondansetron (ZOFRAN) 4 MG tablet Take 1 tablet (4 mg total) by mouth daily as needed for nausea or vomiting. 11/13/22 11/13/23 Yes Orpah Cobb, MD    Inpatient Medications: Scheduled Meds:  levETIRAcetam  500 mg Oral BID   Continuous Infusions:  PRN Meds: acetaminophen, nitroGLYCERIN, oxyCODONE  Allergies:    Allergies  Allergen Reactions   Sulfa Antibiotics Nausea Only and Rash    Social History:   Social History   Socioeconomic History   Marital status: Single    Spouse name: Not on file   Number of children: 2   Years of education: Not on file   Highest education level: Not on file  Occupational History   Occupation: Security Guard    Comment: Designer, industrial/product  Tobacco Use   Smoking status: Some Days    Types: Cigars   Smokeless tobacco: Never  Vaping Use   Vaping status: Never Used  Substance and Sexual Activity   Alcohol use: Never   Drug use: Yes    Types: Marijuana    Comment: " medical Marijuana "   Sexual activity: Yes  Other Topics Concern   Not on file  Social History Narrative   Not on file   Social Determinants of Health   Financial Resource Strain: Not on file  Food Insecurity: Food Insecurity Present  (12/04/2022)   Hunger Vital Sign    Worried About Running Out of Food in the Last Year: Often true    Ran Out of Food in the Last Year: Often true  Transportation Needs: No Transportation Needs (12/04/2022)   PRAPARE - Administrator, Civil Service (Medical): No    Lack of Transportation (Non-Medical): No  Physical Activity: Not on file  Stress: Not on file  Social Connections: Unknown (08/21/2021)   [facility]   Social Network    Social Network: Not on file  Intimate Partner Violence: Not At Risk (12/04/2022)   Humiliation, Afraid, Rape, and Kick questionnaire    Fear of Current or Ex-Partner: No    Emotionally Abused: No    Physically Abused: No    Sexually Abused: No    Family  History:    Family History  Problem Relation Age of Onset   Heart disease Mother    Hypertension Mother    Heart disease Father    Diabetes Mellitus II Father    Arrhythmia Brother      ROS:  Please see the history of present illness.   All other ROS reviewed and negative.     Physical Exam/Data:   Vitals:   12/04/22 1015 12/04/22 1100 12/04/22 1145 12/04/22 1220  BP:    (!) 126/90  Pulse: 60 (!) 56 (!) 54 (!) 58  Resp: 15 15 17 15   Temp:    98.5 F (36.9 C)  TempSrc:    Oral  SpO2: 100% 100% 100% 100%   No intake or output data in the 24 hours ending 12/04/22 1305    11/22/2022    8:51 AM 11/13/2022    5:25 AM 11/11/2022    9:55 PM  Last 3 Weights  Weight (lbs) 219 lb 9.6 oz 222 lb 0.1 oz 218 lb 11.1 oz  Weight (kg) 99.61 kg 100.7 kg 99.2 kg     There is no height or weight on file to calculate BMI.  General: Obese, female in no distress;  HEENT: normal Neck: no JVD Vascular: No carotid bruits; Distal pulses 2+ bilaterally Cardiac: Distal heart sounds, normal S1, S2; RRR; no murmur  Lungs:  clear to auscultation bilaterally, no wheezing, rhonchi or rales  Abd: soft, nontender, no hepatomegaly  Ext: no edema Musculoskeletal:  No deformities, BUE and BLE  strength normal and equal Skin: warm and dry  Neuro:  CNs 2-12 intact, no focal abnormalities noted Psych: Somewhat blunted/flat affect.  Does not respond much verbally to questions.  EKG:  The EKG was personally reviewed and demonstrates:  sinus rhythm, 86 bpm, no ST/TW changes  Telemetry:  Telemetry was personally reviewed and demonstrates:  sinus rhythm  Relevant CV Studies:  TEE: 11/12/2022: Mildly reduced EF of 40 to 45%.  Global HK.  Mild dilation.  Mild LA dilation with no LAA thrombus.  Normal RV size and function.  Normal aortic valve with mild to moderate MR.  Mildly elevated RAP.  Negative bubble study   Laboratory Data:  High Sensitivity Troponin:   Recent Labs  Lab 11/10/22 2340 11/11/22 0041 11/11/22 1626 12/03/22 1847 12/03/22 2119  TROPONINIHS 99* 89* 79* 53* 48*     Chemistry Recent Labs  Lab 12/03/22 1847 12/04/22 0422  NA 138 137  K 3.4* 3.5  CL 111 114*  CO2 16* 16*  GLUCOSE 94 90  BUN 34* 35*  CREATININE 3.84* 3.76*  CALCIUM 8.3* 8.0*  MG  --  1.5*  GFRNONAA 13* 13*  ANIONGAP 11 7    Recent Labs  Lab 12/03/22 1847  PROT 6.4*  ALBUMIN 3.2*  AST 14*  ALT 14  ALKPHOS 84  BILITOT 0.3   Lipids No results for input(s): "CHOL", "TRIG", "HDL", "LABVLDL", "LDLCALC", "CHOLHDL" in the last 168 hours.  Hematology Recent Labs  Lab 12/03/22 1847 12/04/22 0422  WBC 10.7* 9.5  RBC 4.61 4.38  HGB 12.1 11.5*  HCT 37.1 36.2  MCV 80.5 82.6  MCH 26.2 26.3  MCHC 32.6 31.8  RDW 15.1 15.2  PLT 259 217   Thyroid No results for input(s): "TSH", "FREET4" in the last 168 hours.  BNP Recent Labs  Lab 12/03/22 1847  BNP 137.8*    DDimer No results for input(s): "DDIMER" in the last 168 hours.   Radiology/Studies:  DG Chest  Port 1 View  Result Date: 12/03/2022 CLINICAL DATA:  Chest pain and seizures. EXAM: PORTABLE CHEST 1 VIEW COMPARISON:  November 10, 2022 FINDINGS: The cardiac silhouette is mildly enlarged and unchanged in size. Coronary artery  stents are seen. Mild, diffusely increased lung markings are present with very mild atelectasis noted within the right lung base and left perihilar region. No pleural effusion or pneumothorax is identified. Surgical clips are seen within the right upper quadrant. A radiopaque fusion plate and screws are seen overlying the cervical spine. The visualized skeletal structures are unremarkable. IMPRESSION: Mild cardiomegaly with very mild right basilar and left perihilar atelectasis. Electronically Signed   By: Aram Candela M.D.   On: 12/03/2022 19:50     Assessment and Plan:   Colleen Curtis is a 57 y.o. female with a hx of CAD s/p 4 stents while in [place], obesity, tobacco use, HTN, CKD IV, CVA and seizures who is being seen 12/04/2022 for the evaluation of chest pain at the request of Dr. Earlene Plater.  Chest pain CAD s/p prior stenting while in [place] -- presented to the ED via EMS after episode of chest pain while standing. Radiation into the right arm, shortness of breath and dizziness. Worse with palpation and breathing. Symptoms seem atypical in nature. Last cath hsTn 53>>48, EKG without ST/T wave changes. With her CKD and atypical symptoms, would not plan for invasive work up at this time.  -- needs to be resumed on medications, as she has not been compliant  -- resume coreg 3.125mg  BID, Imdur 30mg  daily, ASA 81mg  daily, add atorvastatin 40mg  daily (meds that were listed on her most recent clinic note.  Need to ensure that she is actually taking these medications)  HFrEF -- LVEF most recently 40-45%, global hypokinesis, normal RV, mild LAE, mild to moderate MR -- does not appear volume overloaded on exam -- as above, needs to be resumed on meds -- GDMT: coreg 3.125mg  BID, Imdur 30mg  daily, unable to add ARB/ARNi/MRA with renal disease => difficult to ascertain whether we can titrate medications further or not because unsure if she is taking medications correctly.  CKD stage IV -- baseline Cr  about 3.5 -- has been referred to outpatient nephrology  HTN -- continue coreg 3.125mg  BID  Per primary Seizure COPD Tobacco use -> clearly needs to quit.  Has CAD and history of stroke and CKD stage IV as well as HFrEF -> brief counseling   Signed, Laverda Page, NP  12/04/2022 1:05 PM   ATTENDING ATTESTATION  I have seen, examined and evaluated the patient this afternoon in consultation (in the ER) along with Laverda Page, NP.  After reviewing all the available data and chart, we discussed the patients laboratory, study & physical findings as well as symptoms in detail.  I agree with her findings, examination as well as impression recommendations as per our discussion.    Attending adjustments noted in italics.   Chest pain that I agree sounds quite musculoskeletal in nature.  Troponin levels of 53 and 48 and the patient was creatinine is at the level of hers is not overly concerning and is likely just elevated troponin in the setting of renal failure. Very difficult to know if she is even taken medications she supposed be on.  Will try to restart what was apparently her home medication regimen and just reassess pressures and symptoms.  I will think we need to perform an ischemic evaluation at this time.  Her renal insufficiency precludes the  consideration of invasive evaluation unless symptoms are very concerning.  As such, we will hold off on going ischemic evaluation noninvasively.  Needs counseling on diet, smoking and medication adherence.    Marykay Lex, MD, MS Bryan Lemma, M.D., M.S. Interventional Cardiologist  [facility]  Pager # [phone] Phone # [phone] [address] [place], [place] [address]     For questions or updates, please contact [facility] Please consult [email or web address] for contact info under

## 2022-12-04 NOTE — H&P (Addendum)
History and Physical    Patient: Colleen Curtis YQM:[id number] DOB: Nov 24, 1965 DOA: 12/03/2022 DOS: the patient was seen and examined on 12/04/2022 PCP: Pcp, No  Patient coming from: Home  Chief Complaint:  Chief Complaint  Patient presents with   Chest Pain   HPI: Colleen Curtis is a 57 y.o. female with medical history significant of COPD, bipolar disorder, CAD/MI, hypertension, seizure disorder, small stroke in April 2024.  Patient presented to the ED via EMS.  She was reportedly at a gas station and junk food but prior to eating the food she developed sharp central chest pain radiating to left arm.  Reported MI 5 months ago and stated the pain was similar.  Reported she was short of breath and dizzy.  Was given 324 mg of aspirin and 1 nitroglycerin by EMS prior to arrival to the hospital.  Upon initial presentation temperature was 99.1, otherwise was hemodynamically stable and nonhypoxic and had intermittent episodes of sinus bradycardia.  Pertinent labs include potassium 3.4, creatinine of 3.84 which is baseline, BNP was 137.8, troponin was 53, white count was 10,700 EKG was unremarkable.  Chest x-ray revealed mild cardiomegaly with very mild right basilar and left perihilar atelectasis.  Patient had apparently 2 episodes of witnessed seizure-like activity with postictal phases while in the ER.  Hospitalist service was asked to evaluate the patient for admission.  Upon my evaluation of the patient she reported that she had sharp chest pain that occurred with breathing.  This chest pain was also reproducible with palpation.  Patient stated that she had stopped taking her previous AED/Keppra due to daytime sleepiness.  Review of Systems: As mentioned in the history of present illness. All other systems reviewed and are negative. Past Medical History:  Diagnosis Date   Bipolar disorder (HCC)    Cancer (HCC)    COPD (chronic obstructive pulmonary disease) (HCC)    Coronary  artery disease    Diverticulitis    Hypertension    Myocardial infarction (HCC)    Seizure (HCC)    Stroke [facility])    Past Surgical History:  Procedure Laterality Date   ABDOMINAL SURGERY     BRAIN SURGERY     CHOLECYSTECTOMY     LEFT HEART CATH AND CORONARY ANGIOGRAPHY N/A 11/27/2020   Procedure: LEFT HEART CATH AND CORONARY ANGIOGRAPHY;  Surgeon: Orpah Cobb, MD;  Location: [facility];  Service: Cardiovascular;  Laterality: N/A;   TEE WITHOUT CARDIOVERSION N/A 11/12/2022   Procedure: TRANSESOPHAGEAL ECHOCARDIOGRAM;  Surgeon: Orpah Cobb, MD;  Location: [facility];  Service: Cardiovascular;  Laterality: N/A;   Social History:  reports that she has been smoking cigars. She has never used smokeless tobacco. She reports current drug use. Drug: Marijuana. She reports that she does not drink alcohol.  Allergies  Allergen Reactions   Sulfa Antibiotics Nausea Only and Rash    Family History  Problem Relation Age of Onset   Heart disease Mother    Hypertension Mother    Heart disease Father    Diabetes Mellitus II Father    Arrhythmia Brother     Prior to Admission medications   Medication Sig Start Date End Date Taking? Authorizing Provider  acetaminophen (TYLENOL) 325 MG tablet Take 2 tablets (650 mg total) by mouth every 4 (four) hours as needed for headache or mild pain. Patient taking differently: Take 3 tablets by mouth 2 (two) times daily as needed for headache or mild pain. 11/29/20  Yes Orpah Cobb, MD  amLODipine (NORVASC) 2.5 MG tablet Take 1 tablet (2.5 mg total) by mouth daily. 11/14/22  Yes Orpah Cobb, MD  aspirin EC 81 MG tablet Take 1 tablet (81 mg total) by mouth daily. Swallow whole. Patient taking differently: Take 81 mg by mouth daily. 09/25/22  Yes Gloris Manchester, MD  carvedilol (COREG) 3.125 MG tablet Take 1 tablet (3.125 mg total) by mouth 2 (two) times daily with a meal. 09/25/22  Yes Elmer Picker, NP  isosorbide mononitrate (IMDUR) 30 MG 24 hr  tablet Take 1 tablet (30 mg total) by mouth daily. 11/14/22  Yes Orpah Cobb, MD  meclizine (ANTIVERT) 25 MG tablet Take 1 tablet (25 mg total) by mouth 2 (two) times daily as needed for dizziness. 11/13/22  Yes Orpah Cobb, MD  nitroGLYCERIN (NITROSTAT) 0.4 MG SL tablet Place 1 tablet (0.4 mg total) under the tongue every 5 (five) minutes x 3 doses as needed for chest pain. 11/13/22  Yes Orpah Cobb, MD  ondansetron (ZOFRAN) 4 MG tablet Take 1 tablet (4 mg total) by mouth daily as needed for nausea or vomiting. 11/13/22 11/13/23 Yes Orpah Cobb, MD    Physical Exam: Vitals:   12/04/22 1145 12/04/22 1220 12/04/22 1245 12/04/22 1315  BP:  (!) 126/90 130/86 120/84  Pulse: (!) 54 (!) 58  (!) 58  Resp: 17 15 17 19   Temp:  98.5 F (36.9 C)    TempSrc:  Oral    SpO2: 100% 100%  98%   Constitutional: NAD, calm, comfortable Respiratory: clear to auscultation bilaterally, no wheezing, no crackles. Normal respiratory effort. No accessory muscle use.  Room air Cardiovascular: Regular rate and rhythm, no murmurs / rubs / gallops. No extremity edema. 2+ pedal pulses. No carotid bruits.  Abdomen: no tenderness, no masses palpated. No hepatosplenomegaly. Bowel sounds positive.  Musculoskeletal: no clubbing / cyanosis. No joint deformity upper and lower extremities. Good ROM, no contractures. Normal muscle tone.  Skin: no rashes, lesions, ulcers. No induration Neurologic: CN 2-12 grossly intact. Sensation intact, DTR normal. Strength 5/5 x all 4 extremities.  Psychiatric: Somewhat slow to respond.  Alert and oriented x 3. Normal mood.    Data Reviewed:  Follow-up labs essentially the same although WBC down to 9.5  Assessment and Plan: Atypical chest pain/history of CAD and MI Patient inconsistent historian.  History obtained more consistent with musculoskeletal chest pain Due to her history of CAD and MI cardiology has been consulted and I am awaiting their input. Resume preadmission carvedilol, baby  aspirin, Imdur Not on statin Cardiology consultation pending  Breakthrough seizures/history of seizure disorder Patient with history of noncompliance with AED secondary to side effects-stopped Keppra secondary to sleepiness Appreciate assistance of neurology Patient is self-pay and plans are to begin Lamictal patient will need close follow-up as an outpatient to ensure she takes his medication as prescribed-neurology noted that If the pt misses a few doses and then starts back on a high dose instead of a slow up titration, then they are at risk of Trudie Buckler syndrome  She will need home health RN follow-up  Hypertension Resume Norvasc  CKD 4 Renal function at baseline  COPD Compensated  Hypomagnesemia Replace -labs in a.m.    Advance Care Planning:   Code Status: Full Code   VTE prophylaxis: Lovenox  Consults: Neurology, cardiology  Family Communication: Patient only  Severity of Illness: The appropriate patient status for this patient is OBSERVATION. Observation status is judged to be reasonable and necessary in order to provide the required  intensity of service to ensure the patient's safety. The patient's presenting symptoms, physical exam findings, and initial radiographic and laboratory data in the context of their medical condition is felt to place them at decreased risk for further clinical deterioration. Furthermore, it is anticipated that the patient will be medically stable for discharge from the hospital within 2 midnights of admission.   Author: Junious Silk, NP 12/04/2022 1:50 PM  For on call review [email or web address].   The patient was Seen and examined independently, the above findings were reviewed independently.  Assessment and plan plan of care was discussed with NP in detail. Been modified accordingly.   Time spent 55 minutes   SIGNED: Kendell Bane, MD, FHM. FAAFP [facility],  Pager (please use [email or web address] to page/text)  Please use Epic  Secure Chat for non-urgent communication (7AM-7PM) If 7PM-7AM, please contact night-coverage [email or web address],  12/04/2022, 2:09 PM

## 2022-12-04 NOTE — Procedures (Signed)
Patient Name: Colleen Curtis  MRN: [id number]  Epilepsy Attending: Charlsie Quest  Referring Physician/Provider: Russella Dar, NP  Date: 12/04/2022 Duration: 22.13 mins  Patient history: 57yo F with seizure like activity getting eeg to evaluate for seizure  Level of alertness: Awake, asleep  AEDs during EEG study: None  Technical aspects: This EEG study was done with scalp electrodes positioned according to the 10-20 International system of electrode placement. Electrical activity was reviewed with band pass filter of 1-70Hz , sensitivity of 7 uV/mm, display speed of 57mm/sec with a 60Hz  notched filter applied as appropriate. EEG data were recorded continuously and digitally stored.  Video monitoring was available and reviewed as appropriate.  Description: The posterior dominant rhythm consists of 8 Hz activity of moderate voltage (25-35 uV) seen predominantly in posterior head regions, symmetric and reactive to eye opening and eye closing. Sleep was characterized by vertex waves, sleep spindles (12 to 14 Hz), maximal frontocentral region. Hyperventilation and photic stimulation were not performed.     IMPRESSION: This study is within normal limits. No seizures or epileptiform discharges were seen throughout the recording.  A normal interictal EEG does not exclude the diagnosis of epilepsy.  Lynzy Rawles Annabelle Harman

## 2022-12-04 NOTE — ED Notes (Addendum)
RN attempted x 2 for new IV and unsuccessful. IV fluids stopped at moment and IV that was infiltrated was removed.

## 2022-12-04 NOTE — Progress Notes (Signed)
EEG complete - results pending 

## 2022-12-04 NOTE — ED Notes (Signed)
ED TO INPATIENT HANDOFF REPORT  ED Nurse Name and Phone #: Delice Bison, RN   S Name/Age/Gender Colleen Curtis 57 y.o. female Room/Bed: [phone]  Code Status   Code Status: Full Code  Home/SNF/Other Home Patient oriented to: self, place, time, and situation Is this baseline? Yes   Triage Complete: Triage complete  Chief Complaint Chest pain [R07.9]  Triage Note Pt to ED via EMS from [place] [facility]. Pt states she was getting some "junk" food at the gas station, but prior to eating the food, pt reports developing sharp central chest pain radiating to left arm. Pt has hx of MI 5 months ago and states pain today feels the same. Pt denies N/V but does endorse SOB and dizziness. EMS gave 324 of ASA and 1 nitroglycerin en route with no relief.    EMS Vitals: 152/102 94 HR 100% RA 107 CBG 18 RR   Allergies Allergies  Allergen Reactions   Sulfa Antibiotics Nausea Only and Rash    Level of Care/Admitting Diagnosis ED Disposition     ED Disposition  Admit   Condition  --   Comment  Hospital Area: [facility] [[id number]]  Level of Care: Telemetry Cardiac [[id number]]  May place patient in observation at [facility] or [facility] if equivalent level of care is available:: No  Covid Evaluation: Asymptomatic - no recent exposure (last 10 days) testing not required  Diagnosis: Chest pain [308657]  Admitting Physician: Briscoe Deutscher [[id number]]  Attending Physician: Briscoe Deutscher [[id number]]          B Medical/Surgery History Past Medical History:  Diagnosis Date   Bipolar disorder (HCC)    Cancer (HCC)    COPD (chronic obstructive pulmonary disease) (HCC)    Coronary artery disease    Diverticulitis    Hypertension    Myocardial infarction (HCC)    Seizure (HCC)    Stroke (HCC)    Past Surgical History:  Procedure Laterality Date   ABDOMINAL SURGERY     BRAIN SURGERY     CHOLECYSTECTOMY     LEFT HEART CATH AND CORONARY ANGIOGRAPHY N/A 11/27/2020    Procedure: LEFT HEART CATH AND CORONARY ANGIOGRAPHY;  Surgeon: Orpah Cobb, MD;  Location: [facility] INVASIVE CV LAB;  Service: Cardiovascular;  Laterality: N/A;   TEE WITHOUT CARDIOVERSION N/A 11/12/2022   Procedure: TRANSESOPHAGEAL ECHOCARDIOGRAM;  Surgeon: Orpah Cobb, MD;  Location: [facility] INVASIVE CV LAB;  Service: Cardiovascular;  Laterality: N/A;     A IV Location/Drains/Wounds Patient Lines/Drains/Airways Status     Active Line/Drains/Airways     Name Placement date Placement time Site Days   Peripheral IV 12/04/22 22 G 1" Distal;Posterior;Right Forearm 12/04/22  0339  Forearm  less than 1   Pressure Injury 07/23/22 Toe (Comment  which one) Right;Left 07/23/22  0200  -- 134   Pressure Injury 07/23/22 Foot Left Stage 2 -  Partial thickness loss of dermis presenting as a shallow open injury with a red, pink wound bed without slough. 07/23/22  0200  -- 134            Intake/Output Last 24 hours No intake or output data in the 24 hours ending 12/04/22 1412  Labs/Imaging Results for orders placed or performed during the hospital encounter of 12/03/22 (from the past 48 hour(s))  Comprehensive metabolic panel     Status: Abnormal   Collection Time: 12/03/22  6:47 PM  Result Value Ref Range   Sodium 138 135 - 145 mmol/L   Potassium  3.4 (L) 3.5 - 5.1 mmol/L   Chloride 111 98 - 111 mmol/L   CO2 16 (L) 22 - 32 mmol/L   Glucose, Bld 94 70 - 99 mg/dL    Comment: Glucose reference range applies only to samples taken after fasting for at least 8 hours.   BUN 34 (H) 6 - 20 mg/dL   Creatinine, Ser 4.74 (H) 0.44 - 1.00 mg/dL   Calcium 8.3 (L) 8.9 - 10.3 mg/dL   Total Protein 6.4 (L) 6.5 - 8.1 g/dL   Albumin 3.2 (L) 3.5 - 5.0 g/dL   AST 14 (L) 15 - 41 U/L   ALT 14 0 - 44 U/L   Alkaline Phosphatase 84 38 - 126 U/L   Total Bilirubin 0.3 0.3 - 1.2 mg/dL   GFR, Estimated 13 (L) >60 mL/min    Comment: (NOTE) Calculated using the CKD-EPI Creatinine Equation (2021)    Anion gap 11 5 - 15     Comment: Performed at [facility], [address], [place], [place] [address]  Troponin I (High Sensitivity)     Status: Abnormal   Collection Time: 12/03/22  6:47 PM  Result Value Ref Range   Troponin I (High Sensitivity) 53 (H) <18 ng/L    Comment: (NOTE) Elevated high sensitivity troponin I (hsTnI) values and significant  changes across serial measurements may suggest ACS but many other  chronic and acute conditions are known to elevate hsTnI results.  Refer to the "Links" section for chest pain algorithms and additional  guidance. Performed at [facility], [address], [place], [place] [address]   CBC with Differential     Status: Abnormal   Collection Time: 12/03/22  6:47 PM  Result Value Ref Range   WBC 10.7 (H) 4.0 - 10.5 K/uL   RBC 4.61 3.87 - 5.11 MIL/uL   Hemoglobin 12.1 12.0 - 15.0 g/dL   HCT 43.3 29.5 - 18.8 %   MCV 80.5 80.0 - 100.0 fL   MCH 26.2 26.0 - 34.0 pg   MCHC 32.6 30.0 - 36.0 g/dL   RDW 41.6 60.6 - 30.1 %   Platelets 259 150 - 400 K/uL   nRBC 0.0 0.0 - 0.2 %   Neutrophils Relative % 64 %   Neutro Abs 7.0 1.7 - 7.7 K/uL   Lymphocytes Relative 25 %   Lymphs Abs 2.6 0.7 - 4.0 K/uL   Monocytes Relative 6 %   Monocytes Absolute 0.7 0.1 - 1.0 K/uL   Eosinophils Relative 3 %   Eosinophils Absolute 0.3 0.0 - 0.5 K/uL   Basophils Relative 1 %   Basophils Absolute 0.1 0.0 - 0.1 K/uL   Immature Granulocytes 1 %   Abs Immature Granulocytes 0.05 0.00 - 0.07 K/uL    Comment: Performed at [facility], [address], [place], [place] [address]  Brain natriuretic peptide     Status: Abnormal   Collection Time: 12/03/22  6:47 PM  Result Value Ref Range   B Natriuretic Peptide 137.8 (H) 0.0 - 100.0 pg/mL    Comment: Performed at [facility], [address], [place], [place] [address]  Troponin I (High Sensitivity)     Status: Abnormal   Collection Time: 12/03/22  9:19 PM  Result Value Ref Range   Troponin I (High Sensitivity) 48 (H)  <18 ng/L    Comment: (NOTE) Elevated high sensitivity troponin I (hsTnI) values and significant  changes across serial measurements may suggest ACS but  many other  chronic and acute conditions are known to elevate hsTnI results.  Refer to the "Links" section for chest pain algorithms and additional  guidance. Performed at [facility], [address], [place], [place] [address]   Urinalysis, Routine w reflex microscopic -Urine, Clean Catch     Status: Abnormal   Collection Time: 12/03/22 10:25 PM  Result Value Ref Range   Color, Urine YELLOW YELLOW   APPearance CLOUDY (A) CLEAR   Specific Gravity, Urine 1.015 1.005 - 1.030   pH 5.0 5.0 - 8.0   Glucose, UA NEGATIVE NEGATIVE mg/dL   Hgb urine dipstick SMALL (A) NEGATIVE   Bilirubin Urine NEGATIVE NEGATIVE   Ketones, ur NEGATIVE NEGATIVE mg/dL   Protein, ur 295 (A) NEGATIVE mg/dL   Nitrite NEGATIVE NEGATIVE   Leukocytes,Ua NEGATIVE NEGATIVE   RBC / HPF 0-5 0 - 5 RBC/hpf   WBC, UA 0-5 0 - 5 WBC/hpf   Bacteria, UA FEW (A) NONE SEEN   Squamous Epithelial / HPF 21-50 0 - 5 /HPF   Mucus PRESENT     Comment: Performed at [facility], [address], [place], [place] [address]  Basic metabolic panel     Status: Abnormal   Collection Time: 12/04/22  4:22 AM  Result Value Ref Range   Sodium 137 135 - 145 mmol/L   Potassium 3.5 3.5 - 5.1 mmol/L   Chloride 114 (H) 98 - 111 mmol/L   CO2 16 (L) 22 - 32 mmol/L   Glucose, Bld 90 70 - 99 mg/dL    Comment: Glucose reference range applies only to samples taken after fasting for at least 8 hours.   BUN 35 (H) 6 - 20 mg/dL   Creatinine, Ser 8.65 (H) 0.44 - 1.00 mg/dL   Calcium 8.0 (L) 8.9 - 10.3 mg/dL   GFR, Estimated 13 (L) >60 mL/min    Comment: (NOTE) Calculated using the CKD-EPI Creatinine Equation (2021)    Anion gap 7 5 - 15    Comment: Performed at [facility], [address] [place], [place] [address]  Magnesium     Status: Abnormal   Collection Time: 12/04/22   4:22 AM  Result Value Ref Range   Magnesium 1.5 (L) 1.7 - 2.4 mg/dL    Comment: Performed at [facility], [address], [place], [place] [address]  CBC     Status: Abnormal   Collection Time: 12/04/22  4:22 AM  Result Value Ref Range   WBC 9.5 4.0 - 10.5 K/uL   RBC 4.38 3.87 - 5.11 MIL/uL   Hemoglobin 11.5 (L) 12.0 - 15.0 g/dL   HCT 84.1 32.4 - 40.1 %   MCV 82.6 80.0 - 100.0 fL   MCH 26.3 26.0 - 34.0 pg   MCHC 31.8 30.0 - 36.0 g/dL   RDW 02.7 25.3 - 66.4 %   Platelets 217 150 - 400 K/uL   nRBC 0.0 0.0 - 0.2 %    Comment: Performed at [facility], [address] [place], [place] [address]   DG Chest Port 1 View  Result Date: 12/03/2022 CLINICAL DATA:  Chest pain and seizures. EXAM: PORTABLE CHEST 1 VIEW COMPARISON:  November 10, 2022 FINDINGS: The cardiac silhouette is mildly enlarged and unchanged in size. Coronary artery stents are seen. Mild, diffusely increased lung markings are present with very mild atelectasis noted within the right lung base and left perihilar region. No pleural effusion or pneumothorax is identified.  Surgical clips are seen within the right upper quadrant. A radiopaque fusion plate and screws are seen overlying the cervical spine. The visualized skeletal structures are unremarkable. IMPRESSION: Mild cardiomegaly with very mild right basilar and left perihilar atelectasis. Electronically Signed   By: Aram Candela M.D.   On: 12/03/2022 19:50    Pending Labs Unresulted Labs (From admission, onward)     Start     Ordered   12/11/22 0500  Creatinine, serum  (enoxaparin (LOVENOX)    CrCl >/= 30 ml/min)  Weekly,   R     Comments: while on enoxaparin therapy    12/04/22 1350   12/05/22 0500  Magnesium  Tomorrow morning,   R        12/04/22 1343   12/05/22 0500  Basic metabolic panel  Tomorrow morning,   R        12/04/22 1343   12/04/22 1350  CBC  (enoxaparin (LOVENOX)    CrCl >/= 30 ml/min)  Once,   R       Comments: Baseline for enoxaparin  therapy IF NOT ALREADY DRAWN.  Notify MD if PLT < 100 K.    12/04/22 1350   12/04/22 1350  Creatinine, serum  (enoxaparin (LOVENOX)    CrCl >/= 30 ml/min)  Once,   R       Comments: Baseline for enoxaparin therapy IF NOT ALREADY DRAWN.    12/04/22 1350   12/04/22 0500  Basic metabolic panel  Daily,   R      12/04/22 0334   12/04/22 0500  CBC  Daily,   R      12/04/22 0334            Vitals/Pain Today's Vitals   12/04/22 1145 12/04/22 1220 12/04/22 1245 12/04/22 1315  BP:  (!) 126/90 130/86 120/84  Pulse: (!) 54 (!) 58  (!) 58  Resp: 17 15 17 19   Temp:  98.5 F (36.9 C)    TempSrc:  Oral    SpO2: 100% 100%  98%  PainSc:        Isolation Precautions No active isolations  Medications Medications  nitroGLYCERIN (NITROSTAT) SL tablet 0.4 mg (0.4 mg Sublingual Given 12/03/22 2238)  acetaminophen (TYLENOL) tablet 650 mg (has no administration in time range)  oxyCODONE (Oxy IR/ROXICODONE) immediate release tablet 5 mg (has no administration in time range)  levETIRAcetam (KEPPRA) tablet 500 mg (500 mg Oral Given 12/04/22 0907)  magnesium sulfate IVPB 2 g 50 mL (2 g Intravenous New Bag/Given 12/04/22 1345)  enoxaparin (LOVENOX) injection 30 mg (has no administration in time range)  sodium chloride flush (NS) 0.9 % injection 3 mL (3 mLs Intravenous Not Given 12/04/22 1353)  levETIRAcetam (KEPPRA) IVPB 1000 mg/100 mL premix (0 mg Intravenous Stopped 12/03/22 1935)  morphine (PF) 4 MG/ML injection 4 mg (4 mg Intravenous Given 12/03/22 2130)  lactated ringers bolus 500 mL (0 mLs Intravenous Stopped 12/04/22 0008)    Mobility walks     Focused Assessments Cardiac Assessment Handoff:  Cardiac Rhythm: Bundle branch block No results found for: "CKTOTAL", "CKMB", "CKMBINDEX", "TROPONINI" Lab Results  Component Value Date   DDIMER 2.37 (H) 11/12/2022   Does the Patient currently have chest pain? No    R Recommendations: See Admitting Provider Note  Report given to:    Additional Notes:

## 2022-12-04 NOTE — Consult Note (Signed)
NEUROLOGY CONSULTATION NOTE   Date of service: December 04, 2022 Patient Name: Colleen Curtis MRN:  [id number] DOB:  [birth year] Reason for consult: "seizures x 2" Requesting Provider: Russella Dar, NP _ _ _   _ __   _ __ _ _  __ __   _ __   __ _  History of Present Illness  Colleen Curtis is a 57 y.o. female with PMH significant for  bipolar disorder, cancer, COPD, CAD, diverticulitis, HTN, MI, seizure, and recent stroke who is admitted and being worked up for chest pain.  In the hospital, she had 2 witnesssed seizures for which neurology consulted.  She was supposed to be on Keppra 500 BID but non compliant. She cites that Keppra made her feel sleepy.  Last seizure was 3 to 4 years ago.    ROS   Constitutional Denies weight loss, fever and chills.   HEENT Denies changes in vision and hearing.   Respiratory Denies SOB and cough.   CV Denies palpitations and CP   GI Denies abdominal pain, nausea, vomiting and diarrhea.   GU Denies dysuria and urinary frequency.   MSK Denies myalgia and joint pain.   Skin Denies rash and pruritus.   Neurological Denies headache and syncope.   Psychiatric Denies recent changes in mood. Denies anxiety and depression.    Past History   Past Medical History:  Diagnosis Date   Bipolar disorder (HCC)    Cancer (HCC)    COPD (chronic obstructive pulmonary disease) (HCC)    Coronary artery disease    Diverticulitis    Hypertension    Myocardial infarction (HCC)    Seizure (HCC)    Stroke [facility])    Past Surgical History:  Procedure Laterality Date   ABDOMINAL SURGERY     BRAIN SURGERY     CHOLECYSTECTOMY     LEFT HEART CATH AND CORONARY ANGIOGRAPHY N/A 11/27/2020   Procedure: LEFT HEART CATH AND CORONARY ANGIOGRAPHY;  Surgeon: Orpah Cobb, MD;  Location: [facility] INVASIVE CV LAB;  Service: Cardiovascular;  Laterality: N/A;   TEE WITHOUT CARDIOVERSION N/A 11/12/2022   Procedure: TRANSESOPHAGEAL ECHOCARDIOGRAM;  Surgeon: Orpah Cobb, MD;  Location: [facility] INVASIVE CV LAB;  Service: Cardiovascular;  Laterality: N/A;   Family History  Problem Relation Age of Onset   Heart disease Mother    Hypertension Mother    Heart disease Father    Diabetes Mellitus II Father    Arrhythmia Brother    Social History   Socioeconomic History   Marital status: Single    Spouse name: Not on file   Number of children: 2   Years of education: Not on file   Highest education level: Not on file  Occupational History   Occupation: Security Guard    Comment: Designer, industrial/product  Tobacco Use   Smoking status: Some Days    Types: Cigars   Smokeless tobacco: Never  Vaping Use   Vaping status: Never Used  Substance and Sexual Activity   Alcohol use: Never   Drug use: Yes    Types: Marijuana    Comment: " medical Marijuana "   Sexual activity: Yes  Other Topics Concern   Not on file  Social History Narrative   Not on file   Social Determinants of Health   Financial Resource Strain: Not on file  Food Insecurity: Food Insecurity Present (12/04/2022)   Hunger Vital Sign    Worried About Running Out of Food in the Last Year:  Often true    Ran Out of Food in the Last Year: Often true  Transportation Needs: No Transportation Needs (12/04/2022)   PRAPARE - Administrator, Civil Service (Medical): No    Lack of Transportation (Non-Medical): No  Physical Activity: Not on file  Stress: Not on file  Social Connections: Unknown (08/21/2021)   Received from [facility]   Social Network    Social Network: Not on file   Allergies  Allergen Reactions   Sulfa Antibiotics Nausea Only and Rash    Medications   Medications Prior to Admission  Medication Sig Dispense Refill Last Dose   acetaminophen (TYLENOL) 325 MG tablet Take 2 tablets (650 mg total) by mouth every 4 (four) hours as needed for headache or mild pain. (Patient taking differently: Take 3 tablets by mouth 2 (two) times daily as needed for headache or  mild pain.)   Past Week   amLODipine (NORVASC) 2.5 MG tablet Take 1 tablet (2.5 mg total) by mouth daily. 30 tablet 2 Past Week   aspirin EC 81 MG tablet Take 1 tablet (81 mg total) by mouth daily. Swallow whole. (Patient taking differently: Take 81 mg by mouth daily.) 30 tablet 11 Past Week   carvedilol (COREG) 3.125 MG tablet Take 1 tablet (3.125 mg total) by mouth 2 (two) times daily with a meal. 60 tablet 0 Past Week   isosorbide mononitrate (IMDUR) 30 MG 24 hr tablet Take 1 tablet (30 mg total) by mouth daily. 30 tablet 3 Past Week   meclizine (ANTIVERT) 25 MG tablet Take 1 tablet (25 mg total) by mouth 2 (two) times daily as needed for dizziness. 60 tablet 3 Past Week   nitroGLYCERIN (NITROSTAT) 0.4 MG SL tablet Place 1 tablet (0.4 mg total) under the tongue every 5 (five) minutes x 3 doses as needed for chest pain. 25 tablet 1 12/03/2022   ondansetron (ZOFRAN) 4 MG tablet Take 1 tablet (4 mg total) by mouth daily as needed for nausea or vomiting. 30 tablet 1 Past Week     Vitals   Vitals:   12/04/22 1245 12/04/22 1315 12/04/22 1553 12/04/22 1621  BP: 130/86 120/84 113/78 118/87  Pulse:  (!) 58 60 64  Resp: 17 19 18 16   Temp:   98.4 F (36.9 C) 98.2 F (36.8 C)  TempSrc:   Oral Oral  SpO2:  98% 96% 100%  Weight:    98.3 kg  Height:    5\' 3"  (1.6 m)     Body mass index is 38.39 kg/m.  Physical Exam   General: Laying comfortably in bed; in no acute distress.  HENT: Normal oropharynx and mucosa. Normal external appearance of ears and nose.  Neck: Supple, no pain or tenderness  CV: No JVD. No peripheral edema.  Pulmonary: Symmetric Chest rise. Normal respiratory effort.  Abdomen: Soft to touch, non-tender.  Ext: No cyanosis, edema, or deformity  Skin: No rash. Normal palpation of skin.   Musculoskeletal: Normal digits and nails by inspection. No clubbing.   Neurologic Examination  Mental status/Cognition: Alert, oriented to self, place, month and year, good attention.   Speech/language: Fluent, comprehension intact, object naming intact, repetition intact.  Cranial nerves:   CN II Pupils equal and reactive to light, no VF deficits    CN III,IV,VI EOM intact, no gaze preference or deviation, no nystagmus    CN V normal sensation in V1, V2, and V3 segments bilaterally    CN VII no asymmetry, no nasolabial fold  flattening    CN VIII normal hearing to speech    CN IX & X normal palatal elevation, no uvular deviation    CN XI 5/5 head turn and 5/5 shoulder shrug bilaterally    CN XII midline tongue protrusion    Motor:  Muscle bulk: Normal, tone normal, pronator drift none tremor none Mvmt Root Nerve  Muscle Right Left Comments  SA C5/6 Ax Deltoid 5 5   EF C5/6 Mc Biceps 5 5   EE C6/7/8 Rad Triceps 5 5   WF C6/7 Med FCR     WE C7/8 PIN ECU     F Ab C8/T1 U ADM/FDI 5 5   HF L1/2/3 Fem Illopsoas 5 5   KE L2/3/4 Fem Quad 5 5   DF L4/5 D Peron Tib Ant 5 5   PF S1/2 Tibial Grc/Sol 5 5    Sensation:  Light touch Intact throughout   Pin prick    Temperature    Vibration   Proprioception    Coordination/Complex Motor:  - Finger to Nose intact bilaterally - Heel to shin intact bilaterally - Rapid alternating movement are normal - Gait: Deferred for patient's safety. Labs   CBC:  Recent Labs  Lab 12/03/22 1847 12/04/22 0422  WBC 10.7* 9.5  NEUTROABS 7.0  --   HGB 12.1 11.5*  HCT 37.1 36.2  MCV 80.5 82.6  PLT 259 217    Basic Metabolic Panel:  Lab Results  Component Value Date   NA 137 12/04/2022   K 3.5 12/04/2022   CO2 16 (L) 12/04/2022   GLUCOSE 90 12/04/2022   BUN 35 (H) 12/04/2022   CREATININE 3.76 (H) 12/04/2022   CALCIUM 8.0 (L) 12/04/2022   GFRNONAA 13 (L) 12/04/2022   Lipid Panel:  Lab Results  Component Value Date   LDLCALC 130 (H) 11/12/2022   HgbA1c:  Lab Results  Component Value Date   HGBA1C 5.4 07/23/2022   Urine Drug Screen:     Component Value Date/Time   LABOPIA POSITIVE (A) 10/13/2022 0700    COCAINSCRNUR NONE DETECTED 10/13/2022 0700   LABBENZ NONE DETECTED 10/13/2022 0700   AMPHETMU NONE DETECTED 10/13/2022 0700   THCU POSITIVE (A) 10/13/2022 0700   LABBARB NONE DETECTED 10/13/2022 0700    Alcohol Level     Component Value Date/Time   ETH <10 09/25/2022 1132    MRI Brain(Personally reviewed) 09/25/2022: No acute stroke or other significant intracranial abnormality.  rEEG:  This study is within normal limits. No seizures or epileptiform discharges were seen throughout the recording.   Impression   Colleen Curtis is a 57 y.o. female with PMH significant for bipolar disorder, cancer, COPD, CAD, diverticulitis, HTN, MI, seizure, and recent stroke who is admitted and being worked up for chest pain.  In the hospital, she had 2 witnesssed seizures for which neurology consulted.  She is back to her baseline with no focal deficits at this time.  She has extensive history of noncompliance with medication.  She tells me that she took herself off Keppra as she was feeling a bit sleepy with Keppra.  I discussed with her alternatives and she is amenable to starting oxcarbazepine.  Recommendations  - start Oxcarbazepine 150mg  BID. - follow up with neurology outpatient -No driving for 6 months.  Has to be seizure-free before she can resume driving.  Also discussed other seizure precautions including avoiding heights, avoiding hot and sharp objects.  Full seizure precautions listed below. - we will signoff. Okay  to discharge home from a neurology standpoint. ______________________________________________________________________   Thank you for the opportunity to take part in the care of this patient. If you have any further questions, please contact the neurology consultation attending.  Signed,  Erick Blinks [facility] _ _ _   _ __   _ __ _ _  __ __   _ __   __ _   Seizure precautions: Per [facility] statutes, patients with seizures are  not allowed to drive until they have been seizure-free for six months and cleared by a physician    Use caution when using heavy equipment or power tools. Avoid working on ladders or at heights. Take showers instead of baths. Ensure the water temperature is not too high on the home water heater. Do not go swimming alone. Do not lock yourself in a room alone (i.e. bathroom). When caring for infants or small children, sit down when holding, feeding, or changing them to minimize risk of injury to the child in the event you have a seizure. Maintain good sleep hygiene. Avoid alcohol.    If patient has another seizure, call 911 and bring them back to the ED if: A.  The seizure lasts longer than 5 minutes.      B.  The patient doesn't wake shortly after the seizure or has new problems such as difficulty seeing, speaking or moving following the seizure C.  The patient was injured during the seizure D.  The patient has a temperature over 102 F (39C) E.  The patient vomited during the seizure and now is having trouble breathing    During the Seizure   - First, ensure adequate ventilation and place patients on the floor on their left side  Loosen clothing around the neck and ensure the airway is patent. If the patient is clenching the teeth, do not force the mouth open with any object as this can cause severe damage - Remove all items from the surrounding that can be hazardous. The patient may be oblivious to what's happening and may not even know what he or she is doing. If the patient is confused and wandering, either gently guide him/her away and block access to outside areas - Reassure the individual and be comforting - Call 911. In most cases, the seizure ends before EMS arrives. However, there are cases when seizures may last over 3 to 5 minutes. Or the individual may have developed breathing difficulties or severe injuries. If a pregnant patient or a person with diabetes develops a seizure, it is  prudent to call an ambulance. - Finally, if the patient does not regain full consciousness, then call EMS. Most patients will remain confused for about 45 to 90 minutes after a seizure, so you must use judgment in calling for help. - Avoid restraints but make sure the patient is in a bed with padded side rails - Place the individual in a lateral position with the neck slightly flexed; this will help the saliva drain from the mouth and prevent the tongue from falling backward - Remove all nearby furniture and other hazards from the area - Provide verbal assurance as the individual is regaining consciousness - Provide the patient with privacy if possible - Call for help and start treatment as ordered by the caregiver    After the Seizure (Postictal Stage)   After a seizure, most patients experience confusion, fatigue, muscle pain and/or a headache. Thus, one should permit the individual to sleep. For the next  few days, reassurance is essential. Being calm and helping reorient the person is also of importance.   Most seizures are painless and end spontaneously. Seizures are not harmful to others but can lead to complications such as stress on the lungs, brain and the heart. Individuals with prior lung problems may develop labored breathing and respiratory distress.

## 2022-12-04 NOTE — Progress Notes (Signed)
Patient refused CBC and creatinine lab draw per phlebotomist. Rennis Harding NP notified and made aware.

## 2022-12-05 ENCOUNTER — Other Ambulatory Visit (HOSPITAL_COMMUNITY): Payer: Self-pay

## 2022-12-05 DIAGNOSIS — R072 Precordial pain: Secondary | ICD-10-CM

## 2022-12-05 LAB — CBC
HCT: 34.4 % — ABNORMAL LOW (ref 36.0–46.0)
Hemoglobin: 11.6 g/dL — ABNORMAL LOW (ref 12.0–15.0)
MCH: 27.2 pg (ref 26.0–34.0)
MCHC: 33.7 g/dL (ref 30.0–36.0)
MCV: 80.6 fL (ref 80.0–100.0)
Platelets: 229 K/uL (ref 150–400)
RBC: 4.27 MIL/uL (ref 3.87–5.11)
RDW: 15.2 % (ref 11.5–15.5)
WBC: 8 K/uL (ref 4.0–10.5)
nRBC: 0 % (ref 0.0–0.2)

## 2022-12-05 LAB — BASIC METABOLIC PANEL
Anion gap: 15 (ref 5–15)
BUN: 36 mg/dL — ABNORMAL HIGH (ref 6–20)
CO2: 12 mmol/L — ABNORMAL LOW (ref 22–32)
Calcium: 8.7 mg/dL — ABNORMAL LOW (ref 8.9–10.3)
Chloride: 112 mmol/L — ABNORMAL HIGH (ref 98–111)
Creatinine, Ser: 3.57 mg/dL — ABNORMAL HIGH (ref 0.44–1.00)
GFR, Estimated: 14 mL/min — ABNORMAL LOW (ref >60)
Glucose, Bld: 86 mg/dL (ref 70–99)
Potassium: 4.9 mmol/L (ref 3.5–5.1)
Sodium: 139 mmol/L (ref 135–145)

## 2022-12-05 LAB — MAGNESIUM: Magnesium: 2.3 mg/dL (ref 1.7–2.4)

## 2022-12-05 MED ORDER — OXCARBAZEPINE 150 MG PO TABS
150.0000 mg | ORAL_TABLET | Freq: Two times a day (BID) | ORAL | 2 refills | Status: AC
  Filled 2022-12-05: qty 60, 30d supply, fill #0

## 2022-12-05 MED ORDER — ASPIRIN 81 MG PO TBEC
81.0000 mg | DELAYED_RELEASE_TABLET | Freq: Every day | ORAL | Status: DC
  Administered 2022-12-05: 81 mg via ORAL
  Filled 2022-12-05: qty 1

## 2022-12-05 MED ORDER — AMLODIPINE BESYLATE 2.5 MG PO TABS
2.5000 mg | ORAL_TABLET | Freq: Every day | ORAL | Status: DC
  Administered 2022-12-05: 2.5 mg via ORAL
  Filled 2022-12-05: qty 1

## 2022-12-05 MED ORDER — ATORVASTATIN CALCIUM 80 MG PO TABS
80.0000 mg | ORAL_TABLET | Freq: Every day | ORAL | 2 refills | Status: AC
  Filled 2022-12-05: qty 30, 30d supply, fill #0

## 2022-12-05 MED ORDER — ATORVASTATIN CALCIUM 80 MG PO TABS
80.0000 mg | ORAL_TABLET | Freq: Every day | ORAL | Status: DC
  Administered 2022-12-05: 80 mg via ORAL
  Filled 2022-12-05: qty 1

## 2022-12-05 MED ORDER — ISOSORBIDE MONONITRATE ER 30 MG PO TB24
30.0000 mg | ORAL_TABLET | Freq: Every day | ORAL | Status: DC
  Administered 2022-12-05: 30 mg via ORAL
  Filled 2022-12-05: qty 1

## 2022-12-05 NOTE — TOC Transition Note (Addendum)
Transition of Care [facility]) - CM/SW Discharge Note   Patient Details  Name: Massa Kloos MRN: [id number] Date of Birth: [birth year]  Transition of Care [facility]) CM/SW Contact:  Leone Haven, RN Phone Number: 12/05/2022, 12:03 PM   Clinical Narrative:    For dc home, NCM assisted with Match Card to [facility] pharmacy and cab voucher.   Per TOC they ran insurance and she has insurance.  So does not need the Match Card. [facility] pharmacy said [employer] is showing for her.   Final next level of care: Home/Self Care Barriers to Discharge: No Barriers Identified   Patient Goals and CMS Choice   Choice offered to / list presented to : NA  Discharge Placement                         Discharge Plan and Services Additional resources added to the After Visit Summary for   In-house Referral: NA Discharge Planning Services: CM Consult Post Acute Care Choice: NA          DME Arranged: N/A DME Agency: NA       HH Arranged: NA          Social Determinants of Health (SDOH) Interventions SDOH Screenings   Food Insecurity: Food Insecurity Present (12/04/2022)  Housing: Medium Risk (12/04/2022)  Transportation Needs: No Transportation Needs (12/04/2022)  Utilities: Not At Risk (12/04/2022)  Social Connections: Unknown (08/21/2021)   Received from [facility]  Tobacco Use: High Risk (12/04/2022)     Readmission Risk Interventions     No data to display

## 2022-12-05 NOTE — Progress Notes (Signed)
This pt has been discharged with TOC meds and and Bed [facility]

## 2022-12-05 NOTE — Discharge Summary (Signed)
DISCHARGE SUMMARY  Colleen Curtis  MR#: [id number]  DOB:1965/12/27  Date of Admission: 12/03/2022 Date of Discharge: 12/05/2022  Attending Physician:Jonita Hirota Silvestre Gunner, MD  Patient's PCP:Pcp, No  Disposition: D/C home   Follow-up Appts:  Follow-up Information     Primary Care Physician Follow up.   Why: The Case Manager will assist you in setting up an appointment with a Primary Care Provider, or you may choose one yourself. It is very important that you keep regular scheduled appointments with a primary doctor to monitor your medical contidions.         HEARTCARE Follow up.   Why: The office will contact you to arrange for a follow up appointment. Contact information: [address] [place] [address] [phone]        Your Neurologist/Seizue Doctor Follow up.   Why: Make an appointment to see your seizure doctor within 3-4 weeks for a follow up check. YOU CAN NOT DRIVE until your seizure doctor tells you it is ok to do so.                Tests Needing Follow-up: -needs outpatient referral to Nephrology   Discharge Diagnoses: Chest pain - history of CAD Seizure disorder with acute breakthrough seizures Chronic systolic congestive heart failure HTN CKD stage IV COPD Hypomagnesemia Medical noncompliance   Initial presentation: 57 year old with a history of COPD, bipolar disorder, CAD, CKD stage IV, HTN, CVA April 2024, and seizure disorder who was brought to the ER 8/27 via EMS after she developed the acute onset of sharp central chest pain radiating into the left arm associated with dizziness and shortness of breath.  EKG at presentation was without acute findings.  While in the ER patient had 2 episodes of seizure-like activity each followed by a brief postictal phase.   Hospital Course:  Chest pain -history of CAD History most consistent with musculoskeletal etiology -troponin not significantly elevated  given clinical situation -it was not felt by Cardiology that an inpatient ischemic evaluation was clinically indicated -counseled on need to resume her usual cardiac medications, which she has been noncompliant with previously    Seizure disorder with acute breakthrough seizures Has an extensive history of noncompliance with AEDs -EEG 8/28 was within normal limits with no evidence of active seizure or epileptiform discharges -neurology recommended starting oxcarbazepine 150 mg twice daily with outpatient neurology follow-up   Chronic systolic congestive heart failure EFon most recent TTE 40-45% with global hypokinesis -euvolemic during this admission -unable to utilize ARB/ARNI/MRA due to renal disease   HTN Blood pressure well-controlled during this hospital stay   CKD stage IV Baseline creatinine approximately 3.5 -needs to follow-up with Nephrology as outpatient -renal function stable during this admission   COPD Well compensated during this hospital stay   Hypomagnesemia Corrected with supplementation    Allergies as of 12/05/2022       Reactions   Sulfa Antibiotics Nausea Only, Rash        Medication List     STOP taking these medications    carvedilol 3.125 MG tablet Commonly known as: COREG   meclizine 25 MG tablet Commonly known as: ANTIVERT       TAKE these medications    acetaminophen 325 MG tablet Commonly known as: TYLENOL Take 2 tablets (650 mg total) by mouth every 4 (four) hours as needed for headache or mild pain. What changed:  how much to take when to take this   amLODipine 2.5 MG tablet  Commonly known as: NORVASC Take 1 tablet (2.5 mg total) by mouth daily.   aspirin EC 81 MG tablet Take 1 tablet (81 mg total) by mouth daily. Swallow whole. What changed: additional instructions   atorvastatin 80 MG tablet Commonly known as: LIPITOR Take 1 tablet (80 mg total) by mouth daily. Start taking on: December 06, 2022   isosorbide mononitrate 30  MG 24 hr tablet Commonly known as: IMDUR Take 1 tablet (30 mg total) by mouth daily.   nitroGLYCERIN 0.4 MG SL tablet Commonly known as: NITROSTAT Place 1 tablet (0.4 mg total) under the tongue every 5 (five) minutes x 3 doses as needed for chest pain.   ondansetron 4 MG tablet Commonly known as: Zofran Take 1 tablet (4 mg total) by mouth daily as needed for nausea or vomiting.   OXcarbazepine 150 MG tablet Commonly known as: TRILEPTAL Take 1 tablet (150 mg total) by mouth 2 (two) times daily.        Day of Discharge BP 107/71 (BP Location: Right Arm)   Pulse 75   Temp 98.3 F (36.8 C) (Oral)   Resp 18   Ht 5\' 3"  (1.6 m)   Wt 98.6 kg   SpO2 96%   BMI 38.51 kg/m   Physical Exam: General: No acute respiratory distress Lungs: Clear to auscultation bilaterally without wheezes or crackles Cardiovascular: Regular rate and rhythm without murmur gallop or rub normal S1 and S2 Abdomen: Nontender, nondistended, soft, bowel sounds positive, no rebound, no ascites, no appreciable mass Extremities: No significant cyanosis, clubbing, or edema bilateral lower extremities  Basic Metabolic Panel: Recent Labs  Lab 12/03/22 1847 12/04/22 0422 12/04/22 2231 12/05/22 0637  NA 138 137  --  139  K 3.4* 3.5  --  4.9  CL 111 114*  --  112*  CO2 16* 16*  --  12*  GLUCOSE 94 90  --  86  BUN 34* 35*  --  36*  CREATININE 3.84* 3.76* 3.68* 3.57*  CALCIUM 8.3* 8.0*  --  8.7*  MG  --  1.5*  --  2.3    CBC: Recent Labs  Lab 12/03/22 1847 12/04/22 0422 12/04/22 2231 12/05/22 0637  WBC 10.7* 9.5 8.6 8.0  NEUTROABS 7.0  --   --   --   HGB 12.1 11.5* 11.7* 11.6*  HCT 37.1 36.2 35.0* 34.4*  MCV 80.5 82.6 80.6 80.6  PLT 259 217 227 229    Time spent in discharge (includes decision making & examination of pt): 35 minutes  12/05/2022, 11:40 AM   Lonia Blood, MD [facility] Office  [phone]

## 2022-12-05 NOTE — TOC Progression Note (Signed)
Transition of Care [facility]) - Progression Note    Patient Details  Name: Colleen Curtis MRN: [id number] Date of Birth: 1965-08-04  Transition [facility] Care [facility]) CM/SW Contact  Leone Haven, RN Phone Number: 12/05/2022, 11:57 AM  Clinical Narrative:    MATCH MEDICATION ASSISTANCE CARD Pharmacies please call [phone] for claim processing assistance.  Rx BIN: [id number] Rx Group: [id number] Rx PCN: [id number] Relationship Code: 1 Person Code: [id number]  Patient ID (MRN): [id number]    Patient Name: Colleen Curtis   Patient DOB: 1965-12-04   Discharge Date:12/05/22  Expiration Date:12/13/22 (must be filled within 7 days of discharge)          Expected Discharge Plan and Services         Expected Discharge Date: 12/05/22                                     Social Determinants of Health (SDOH) Interventions SDOH Screenings   Food Insecurity: Food Insecurity Present (12/04/2022)  Housing: Medium Risk (12/04/2022)  Transportation Needs: No Transportation Needs (12/04/2022)  Utilities: Not At Risk (12/04/2022)  Social Connections: Unknown (08/21/2021)   Received from [facility]  Tobacco Use: High Risk (12/04/2022)    Readmission Risk Interventions     No data to display

## 2022-12-05 NOTE — Plan of Care (Signed)
  Problem: Education: Goal: Understanding of cardiac disease, CV risk reduction, and recovery process will improve Outcome: Progressing   Problem: Activity: Goal: Risk for activity intolerance will decrease Outcome: Progressing

## 2022-12-05 NOTE — Progress Notes (Signed)
Rounding Note    Patient Name: Colleen Curtis Date of Encounter: 12/05/2022  [facility] Cardiologist: Kristeen Miss, MD   Subjective   No chest pain this morning. Ate breakfast, no further seizures.   Inpatient Medications    Scheduled Meds:  enoxaparin (LOVENOX) injection  30 mg Subcutaneous Q24H   OXcarbazepine  150 mg Oral BID   sodium chloride flush  3 mL Intravenous Q12H   Continuous Infusions:  PRN Meds: acetaminophen, nitroGLYCERIN, oxyCODONE   Vital Signs    Vitals:   12/04/22 1952 12/05/22 0137 12/05/22 0514 12/05/22 0841  BP: (!) 162/62 111/64 114/73 107/71  Pulse: (!) 58 62 67 75  Resp: 18 18 18 18   Temp: (!) 97.5 F (36.4 C) 98.5 F (36.9 C) 98.2 F (36.8 C) 98.3 F (36.8 C)  TempSrc: Oral Oral Oral Oral  SpO2: 100% 100% 100% 96%  Weight:   98.6 kg   Height:        Intake/Output Summary (Last 24 hours) at 12/05/2022 0941 Last data filed at 12/05/2022 0841 Gross per 24 hour  Intake 646.93 ml  Output 300 ml  Net 346.93 ml      12/05/2022    5:14 AM 12/04/2022    4:21 PM 11/22/2022    8:51 AM  Last 3 Weights  Weight (lbs) 217 lb 6.4 oz 216 lb 11.4 oz 219 lb 9.6 oz  Weight (kg) 98.612 kg 98.3 kg 99.61 kg      Telemetry    Sinus Rhythm, PVCs, short run of NSVT - Personally Reviewed  Physical Exam   GEN: No acute distress.   Neck: No JVD Cardiac: RRR, no murmurs, rubs, or gallops.  Respiratory: Clear to auscultation bilaterally. GI: Soft, nontender, non-distended  MS: No edema; No deformity. Neuro:  Nonfocal  Psych: Normal affect   Labs    High Sensitivity Troponin:   Recent Labs  Lab 11/10/22 2340 11/11/22 0041 11/11/22 1626 12/03/22 1847 12/03/22 2119  TROPONINIHS 99* 89* 79* 53* 48*     Chemistry Recent Labs  Lab 12/03/22 1847 12/04/22 0422 12/04/22 2231 12/05/22 0637  NA 138 137  --  139  K 3.4* 3.5  --  4.9  CL 111 114*  --  112*  CO2 16* 16*  --  12*  GLUCOSE 94 90  --  86  BUN 34* 35*  --   36*  CREATININE 3.84* 3.76* 3.68* 3.57*  CALCIUM 8.3* 8.0*  --  8.7*  MG  --  1.5*  --  2.3  PROT 6.4*  --   --   --   ALBUMIN 3.2*  --   --   --   AST 14*  --   --   --   ALT 14  --   --   --   ALKPHOS 84  --   --   --   BILITOT 0.3  --   --   --   GFRNONAA 13* 13* 14* 14*  ANIONGAP 11 7  --  15    Lipids No results for input(s): "CHOL", "TRIG", "HDL", "LABVLDL", "LDLCALC", "CHOLHDL" in the last 168 hours.  Hematology Recent Labs  Lab 12/04/22 0422 12/04/22 2231 12/05/22 0637  WBC 9.5 8.6 8.0  RBC 4.38 4.34 4.27  HGB 11.5* 11.7* 11.6*  HCT 36.2 35.0* 34.4*  MCV 82.6 80.6 80.6  MCH 26.3 27.0 27.2  MCHC 31.8 33.4 33.7  RDW 15.2 15.2 15.2  PLT 217 227 229   Thyroid  No results for input(s): "TSH", "FREET4" in the last 168 hours.  BNP Recent Labs  Lab 12/03/22 1847  BNP 137.8*    DDimer No results for input(s): "DDIMER" in the last 168 hours.   Radiology    EEG adult  Result Date: 12/04/2022 Charlsie Quest, MD     12/04/2022  4:10 PM Patient Name: Berdena Miga MRN: [id number] Epilepsy Attending: Charlsie Quest Referring Physician/Provider: Russella Dar, NP Date: 12/04/2022 Duration: 22.13 mins Patient history: 57yo F with seizure like activity getting eeg to evaluate for seizure Level of alertness: Awake, asleep AEDs during EEG study: None Technical aspects: This EEG study was done with scalp electrodes positioned according to the 10-20 International system of electrode placement. Electrical activity was reviewed with band pass filter of 1-70Hz , sensitivity of 7 uV/mm, display speed of 37mm/sec with a 60Hz  notched filter applied as appropriate. EEG data were recorded continuously and digitally stored.  Video monitoring was available and reviewed as appropriate. Description: The posterior dominant rhythm consists of 8 Hz activity of moderate voltage (25-35 uV) seen predominantly in posterior head regions, symmetric and reactive to eye opening and eye closing.  Sleep was characterized by vertex waves, sleep spindles (12 to 14 Hz), maximal frontocentral region. Hyperventilation and photic stimulation were not performed.   IMPRESSION: This study is within normal limits. No seizures or epileptiform discharges were seen throughout the recording. A normal interictal EEG does not exclude the diagnosis of epilepsy. Charlsie Quest   DG Chest Port 1 View  Result Date: 12/03/2022 CLINICAL DATA:  Chest pain and seizures. EXAM: PORTABLE CHEST 1 VIEW COMPARISON:  November 10, 2022 FINDINGS: The cardiac silhouette is mildly enlarged and unchanged in size. Coronary artery stents are seen. Mild, diffusely increased lung markings are present with very mild atelectasis noted within the right lung base and left perihilar region. No pleural effusion or pneumothorax is identified. Surgical clips are seen within the right upper quadrant. A radiopaque fusion plate and screws are seen overlying the cervical spine. The visualized skeletal structures are unremarkable. IMPRESSION: Mild cardiomegaly with very mild right basilar and left perihilar atelectasis. Electronically Signed   By: Aram Candela M.D.   On: 12/03/2022 19:50     Patient Profile     57 y.o. female with a hx of CAD s/p 4 stents while in [place], obesity, tobacco use, HTN, CKD IV, CVA and seizures who is being seen 12/04/2022 for the evaluation of chest pain at the request of Dr. Earlene Plater.   Assessment & Plan    Chest pain CAD s/p prior stenting while in [place] -- presented to the ED via EMS after episode of chest pain while standing. Radiation into the right arm, shortness of breath and dizziness. Worse with palpation and breathing. Symptoms seem atypical in nature. Last cath hsTn 53>>48, EKG without ST/T wave changes. With her CKD and atypical symptoms, would not plan for invasive work up at this time.  -- needs to be resumed on medications, as she has not been compliant  -- BP are soft at times, will resume norvasc 2.5mg   daily, Imdur 30mg  daily, ASA 81mg  daily, atorvastatin 80mg  daily   HFrEF -- LVEF most recently 40-45%, global hypokinesis, normal RV, mild LAE, mild to moderate MR -- does not appear volume overloaded on exam -- as above, needs to be resumed on meds -- GDMT: blood pressures are soft at times, as above resume norvasc 2.5mg  daily, Imdur 30mg  daily, unable to add ARB/ARNi/MRA with  renal disease =>  CKD stage IV -- baseline Cr about 3.5 -- has been referred to outpatient nephrology   HLD -- LDL 130 -- add atorvastatin 80mg  daily  -- FLP/LFTs in 8 weeks  HTN -- norvasc 2.5mg  daily for now, consider addition of core 3.125mg  BID if BP allows   Per primary Seizure COPD Tobacco use   For questions or updates, please contact [facility] Please consult [email or web address] for contact info under        Signed, Laverda Page, NP  12/05/2022, 9:41 AM

## 2022-12-05 NOTE — Plan of Care (Signed)
  Problem: Education: Goal: Understanding of cardiac disease, CV risk reduction, and recovery process will improve Outcome: Progressing Goal: Individualized Educational Video(s) Outcome: Progressing   Problem: Activity: Goal: Ability to tolerate increased activity will improve Outcome: Progressing   

## 2022-12-05 NOTE — Discharge Instructions (Signed)
Seizure precautions: °Per [place] DMV statutes, patients with seizures are not allowed to drive until they have been seizure-free for six months and cleared by a physician  °  °Use caution when using heavy equipment or power tools. Avoid working on ladders or at heights. Take showers instead of baths. Ensure the water temperature is not too high on the home water heater. Do not go swimming alone. Do not lock yourself in a room alone (i.e. bathroom). When caring for infants or small children, sit down when holding, feeding, or changing them to minimize risk of injury to the child in the event you have a seizure. Maintain good sleep hygiene. Avoid alcohol.  °  °If patient has another seizure, call 911 and bring them back to the ED if: °A.  The seizure lasts longer than 5 minutes.      °B.  The patient doesn't wake shortly after the seizure or has new problems such as difficulty seeing, speaking or moving following the seizure °C.  The patient was injured during the seizure °D.  The patient has a temperature over 102 F (39C) °E.  The patient vomited during the seizure and now is having trouble breathing °   °During the Seizure °  °- First, ensure adequate ventilation and place patients on the floor on their left side  °Loosen clothing around the neck and ensure the airway is patent. If the patient is clenching the teeth, do not force the mouth open with any object as this can cause severe damage °- Remove all items from the surrounding that can be hazardous. The patient may be oblivious to what's happening and may not even know what he or she is doing. °If the patient is confused and wandering, either gently guide him/her away and block access to outside areas °- Reassure the individual and be comforting °- Call 911. In most cases, the seizure ends before EMS arrives. However, there are cases when seizures may last over 3 to 5 minutes. Or the individual may have developed breathing difficulties or severe  injuries. If a pregnant patient or a person with diabetes develops a seizure, it is prudent to call an ambulance. °- Finally, if the patient does not regain full consciousness, then call EMS. Most patients will remain confused for about 45 to 90 minutes after a seizure, so you must use judgment in calling for help. °- Avoid restraints but make sure the patient is in a bed with padded side rails °- Place the individual in a lateral position with the neck slightly flexed; this will help the saliva drain from the mouth and prevent the tongue from falling backward °- Remove all nearby furniture and other hazards from the area °- Provide verbal assurance as the individual is regaining consciousness °- Provide the patient with privacy if possible °- Call for help and start treatment as ordered by the caregiver °  ° After the Seizure (Postictal Stage) °  °After a seizure, most patients experience confusion, fatigue, muscle pain and/or a headache. Thus, one should permit the individual to sleep. For the next few days, reassurance is essential. Being calm and helping reorient the person is also of importance. °  °Most seizures are painless and end spontaneously. Seizures are not harmful to others but can lead to complications such as stress on the lungs, brain and the heart. Individuals with prior lung problems may develop labored breathing and respiratory distress.  °  °

## 2022-12-05 NOTE — Progress Notes (Signed)
Pt is currently eating Reviewed Discharge instructions with pt, IV Removed left on tele Monitor due Seizure precaution while here in the hospital. Waiting on TOC meds. Taxi Volcher provided per CM. Patient states that she has a cousin at home to help her.

## 2022-12-05 NOTE — TOC Initial Note (Signed)
Transition of Care [facility]) - Initial/Assessment Note    Patient Details  Name: Colleen Curtis MRN: [id number] Date of Birth: 1965-12-20  [facility] CM/SW Contact:    Leone Haven, RN Phone Number: 12/05/2022, 12:01 PM  Clinical Narrative:                 From home with cousin, has  no PCP and  no insurance on file,  NCM assisted with [employer], and she has follow up on AVS.  She states has no HH services in place at this time, she uses a cane when ambulating. States she will need cab voucher when going home at [employer] and cousin is support system, states gets [facility].    Expected Discharge Plan: Home/Self Care Barriers to Discharge: No Barriers Identified   Patient Goals and CMS Choice Patient states their goals for this hospitalization and ongoing recovery are:: return home   Choice offered to / list presented to : NA      Expected Discharge Plan and Services In-house Referral: NA Discharge Planning Services: CM Consult Post Acute Care Choice: NA Living arrangements for the past 2 months: Single Family Home Expected Discharge Date: 12/05/22               DME Arranged: N/A DME Agency: NA       HH Arranged: NA          Prior Living Arrangements/Services Living arrangements for the past 2 months: Single Family Home Lives with:: Relatives Patient language and need for interpreter reviewed:: Yes Do you feel safe going back to the place where you live?: Yes      Need for Family Participation in Patient Care: Yes (Comment) Care giver support system in place?: Yes (comment) Current home services: DME (cane) Criminal Activity/Legal Involvement Pertinent to Current Situation/Hospitalization: No - Comment as needed  Activities of Daily Living Home Assistive Devices/Equipment: Cane (specify quad or straight), Hand-held shower hose (straight cane) ADL Screening (condition at time of admission) Patient's cognitive ability adequate to  safely complete daily activities?: Yes Is the patient deaf or have difficulty hearing?: No Does the patient have difficulty seeing, even when wearing glasses/contacts?: No Does the patient have difficulty concentrating, remembering, or making decisions?: No Patient able to express need for assistance with ADLs?: Yes Does the patient have difficulty dressing or bathing?: No Independently performs ADLs?: Yes (appropriate for developmental age) Does the patient have difficulty walking or climbing stairs?: No Weakness of Legs: Both Weakness of Arms/Hands: None  Permission Sought/Granted Permission sought to share information with : Case Manager Permission granted to share information with : Yes, Verbal Permission Granted              Emotional Assessment   Attitude/Demeanor/Rapport: Engaged Affect (typically observed): Appropriate Orientation: : Oriented to Self, Oriented to Place, Oriented to  Time, Oriented to Situation   Psych Involvement: No (comment)  Admission diagnosis:  Chest pain [R07.9] Chest pain, unspecified type [R07.9] Patient Active Problem List   Diagnosis Date Noted   Chest pain 12/04/2022   Intractable nausea and vomiting 10/13/2022   CKD (chronic kidney disease), stage IV (HCC) 10/13/2022   CAD (coronary artery disease) 10/13/2022   Acute CVA (cerebrovascular accident) (HCC) 07/22/2022   Acute coronary syndrome (HCC) 04/04/2020   History of DVT (deep vein thrombosis) 09/10/2019   History of heart artery stent 09/10/2019   History of MI (myocardial infarction) 09/10/2019   History of pulmonary embolism 09/10/2019  Hyperlipidemia 09/10/2019   Hypertension 09/10/2019   Stage 3 chronic kidney disease (HCC) 09/10/2019   PCP:  Pcp, No Pharmacy:   [id number] [place], [place] - [address] [place] [address] Phone: [phone] Fax: [phone]  [facility] [address] [place] [address] Phone: [phone] Fax: [phone]     Social Determinants of Health (SDOH) Social History: SDOH Screenings   Food Insecurity: Food Insecurity Present (12/04/2022)  Housing: Medium Risk (12/04/2022)  Transportation Needs: No Transportation Needs (12/04/2022)  Utilities: Not At Risk (12/04/2022)  Social Connections: Unknown (08/21/2021)   Received from [facility]  Tobacco Use: High Risk (12/04/2022)   SDOH Interventions:     Readmission Risk Interventions     No data to display

## 2022-12-05 NOTE — Progress Notes (Signed)
Pt. Refused morning labs

## 2022-12-10 ENCOUNTER — Telehealth: Payer: Self-pay | Admitting: Licensed Clinical Social Worker

## 2022-12-10 NOTE — Telephone Encounter (Signed)
H&V Care Navigation CSW Progress Note  Clinical Social Worker contacted patient by phone to f/u on referral for community resources. No answer again this morning at [phone]. Left additional voicemail requesting return call. Pt does appear to have pharmacy benefits and a re-scheduled PCP appt on 9/12.  I have attempted pt multiple times, mailed resources, and contacted her brother to try and connect with pt without success. Will attempt again 1x more   Patient is participating in a Managed Medicaid Plan:  No, may possibly have commercial insurance  SDOH Screenings   Food Insecurity: Food Insecurity Present (12/04/2022)  Housing: Medium Risk (12/04/2022)  Transportation Needs: No Transportation Needs (12/04/2022)  Utilities: Not At Risk (12/04/2022)  Social Connections: Unknown (08/21/2021)   Received from [facility]  Tobacco Use: High Risk (12/04/2022)   Octavio Graves, MSW, LCSW Clinical Social Worker II [facility] Heart/Vascular Care Navigation  [phone]- work cell phone (preferred) [phone]- desk phone

## 2022-12-12 ENCOUNTER — Telehealth (HOSPITAL_BASED_OUTPATIENT_CLINIC_OR_DEPARTMENT_OTHER): Payer: Self-pay | Admitting: Licensed Clinical Social Worker

## 2022-12-12 NOTE — Telephone Encounter (Signed)
H&V Care Navigation CSW Progress Note  Clinical Social Worker contacted patient by phone to f/u on referral for community resources. No answer again this afternoon at [phone]. Left additional voicemail requesting return call. Pt does appear to have pharmacy benefits and a re-scheduled PCP appt on 9/12.   I have attempted pt multiple times (11/22/22, 11/25/22, 11/28/22,12/10/22 and 12/12/22)  mailed resources (11/25/22), and contacted her brother (11/28/22) to try and connect with pt without success.  Our team remains available as needed   Patient is participating in a Managed Medicaid Plan:  No, may possibly have commercial insurance  SDOH Screenings   Food Insecurity: Food Insecurity Present (12/04/2022)  Housing: Medium Risk (12/04/2022)  Transportation Needs: No Transportation Needs (12/04/2022)  Utilities: Not At Risk (12/04/2022)  Social Connections: Unknown (08/21/2021)   Received from [facility]  Tobacco Use: High Risk (12/04/2022)    Octavio Graves, MSW, LCSW Clinical Social Worker II [facility] Heart/Vascular Care Navigation  [phone]- work cell phone (preferred) [phone]- desk phone

## 2022-12-16 ENCOUNTER — Other Ambulatory Visit: Payer: Self-pay

## 2022-12-16 ENCOUNTER — Emergency Department (HOSPITAL_COMMUNITY)
Admission: EM | Admit: 2022-12-16 | Discharge: 2022-12-16 | Disposition: A | Discharge status: Home / Self Care | Payer: Self-pay | Attending: Emergency Medicine | Admitting: Emergency Medicine

## 2022-12-16 ENCOUNTER — Emergency Department (HOSPITAL_COMMUNITY): Payer: Self-pay

## 2022-12-16 ENCOUNTER — Encounter (HOSPITAL_COMMUNITY): Payer: Self-pay

## 2022-12-16 DIAGNOSIS — Z7982 Long term (current) use of aspirin: Secondary | ICD-10-CM | POA: Insufficient documentation

## 2022-12-16 DIAGNOSIS — Z955 Presence of coronary angioplasty implant and graft: Secondary | ICD-10-CM | POA: Insufficient documentation

## 2022-12-16 DIAGNOSIS — I2510 Atherosclerotic heart disease of native coronary artery without angina pectoris: Secondary | ICD-10-CM | POA: Insufficient documentation

## 2022-12-16 DIAGNOSIS — R079 Chest pain, unspecified: Secondary | ICD-10-CM

## 2022-12-16 DIAGNOSIS — R0789 Other chest pain: Secondary | ICD-10-CM | POA: Insufficient documentation

## 2022-12-16 LAB — CBC
HCT: 45.7 % (ref 36.0–46.0)
Hemoglobin: 14.1 g/dL (ref 12.0–15.0)
MCH: 26.1 pg (ref 26.0–34.0)
MCHC: 30.9 g/dL (ref 30.0–36.0)
MCV: 84.6 fL (ref 80.0–100.0)
Platelets: 217 10*3/uL (ref 150–400)
RBC: 5.4 MIL/uL — ABNORMAL HIGH (ref 3.87–5.11)
RDW: 15.2 % (ref 11.5–15.5)
WBC: 8.1 10*3/uL (ref 4.0–10.5)
nRBC: 0 % (ref 0.0–0.2)

## 2022-12-16 LAB — BASIC METABOLIC PANEL
Anion gap: 16 — ABNORMAL HIGH (ref 5–15)
BUN: 37 mg/dL — ABNORMAL HIGH (ref 6–20)
CO2: 13 mmol/L — ABNORMAL LOW (ref 22–32)
Calcium: 8.9 mg/dL (ref 8.9–10.3)
Chloride: 109 mmol/L (ref 98–111)
Creatinine, Ser: 3.69 mg/dL — ABNORMAL HIGH (ref 0.44–1.00)
GFR, Estimated: 14 mL/min — ABNORMAL LOW (ref >60)
Glucose, Bld: 72 mg/dL (ref 70–99)
Potassium: 4.3 mmol/L (ref 3.5–5.1)
Sodium: 138 mmol/L (ref 135–145)

## 2022-12-16 LAB — TROPONIN I (HIGH SENSITIVITY)
Troponin I (High Sensitivity): 37 ng/L — ABNORMAL HIGH (ref <18)
Troponin I (High Sensitivity): 38 ng/L — ABNORMAL HIGH (ref <18)

## 2022-12-16 MED ORDER — NITROGLYCERIN 0.4 MG SL SUBL: 0.4000 mg | SUBLINGUAL_TABLET | SUBLINGUAL | Status: DC | PRN

## 2022-12-16 NOTE — ED Notes (Signed)
Pt c/o chest and arm pain while moving pt from ems triage to hallway bed.

## 2022-12-16 NOTE — ED Provider Notes (Signed)
[place] EMERGENCY DEPARTMENT AT [facility] Provider Note   CSN: [id number] Arrival date & time: 12/16/22  1659     History  Chief Complaint  Patient presents with   Chest Pain    Colleen Curtis is a 57 y.o. female.  57 yo F with a chief complaints of chest pain.  This is right-sided and radiates to the arm.  Started suddenly tonight while she was trying to go to sleep.  She tells me she has a history of this.  Has had 4 heart attacks in the past all with intervention done in [place].  Nothing seems to make it better or worse.   Chest Pain      Home Medications Prior to Admission medications   Medication Sig Start Date End Date Taking? Authorizing Provider  acetaminophen (TYLENOL) 325 MG tablet Take 2 tablets (650 mg total) by mouth every 4 (four) hours as needed for headache or mild pain. Patient taking differently: Take 3 tablets by mouth 2 (two) times daily as needed for headache or mild pain. 11/29/20   Orpah Cobb, MD  amLODipine (NORVASC) 2.5 MG tablet Take 1 tablet (2.5 mg total) by mouth daily. 11/14/22   Orpah Cobb, MD  aspirin EC 81 MG tablet Take 1 tablet (81 mg total) by mouth daily. Swallow whole. Patient taking differently: Take 81 mg by mouth daily. 09/25/22   Gloris Manchester, MD  atorvastatin (LIPITOR) 80 MG tablet Take 1 tablet (80 mg total) by mouth daily. 12/06/22   Lonia Blood, MD  isosorbide mononitrate (IMDUR) 30 MG 24 hr tablet Take 1 tablet (30 mg total) by mouth daily. 11/14/22   Orpah Cobb, MD  nitroGLYCERIN (NITROSTAT) 0.4 MG SL tablet Place 1 tablet (0.4 mg total) under the tongue every 5 (five) minutes x 3 doses as needed for chest pain. 11/13/22   Orpah Cobb, MD  ondansetron (ZOFRAN) 4 MG tablet Take 1 tablet (4 mg total) by mouth daily as needed for nausea or vomiting. 11/13/22 11/13/23  Orpah Cobb, MD  OXcarbazepine (TRILEPTAL) 150 MG tablet Take 1 tablet (150 mg total) by mouth 2 (two) times daily. 12/05/22   Lonia Blood, MD      Allergies    Sulfa antibiotics    Review of Systems   Review of Systems  Cardiovascular:  Positive for chest pain.    Physical Exam Updated Vital Signs BP (!) 166/104 (BP Location: Left Arm)   Pulse 67   Temp 97.8 F (36.6 C) (Oral)   Resp 18   Ht 5\' 3"  (1.6 m)   Wt 98.6 kg   SpO2 98%   BMI 38.51 kg/m  Physical Exam Vitals and nursing note reviewed.  Constitutional:      General: She is not in acute distress.    Appearance: She is well-developed. She is not diaphoretic.  HENT:     Head: Normocephalic and atraumatic.  Eyes:     Pupils: Pupils are equal, round, and reactive to light.  Cardiovascular:     Rate and Rhythm: Normal rate and regular rhythm.     Heart sounds: No murmur heard.    No friction rub. No gallop.  Pulmonary:     Effort: Pulmonary effort is normal.     Breath sounds: No wheezing or rales.  Chest:     Chest wall: Tenderness present.     Comments: Palpation of the right anterior lateral chest wall reproduces her discomfort. Abdominal:     General:  There is no distension.     Palpations: Abdomen is soft.     Tenderness: There is no abdominal tenderness.  Musculoskeletal:        General: No tenderness.     Cervical back: Normal range of motion and neck supple.  Skin:    General: Skin is warm and dry.  Neurological:     Mental Status: She is alert and oriented to person, place, and time.  Psychiatric:        Behavior: Behavior normal.     ED Results / Procedures / Treatments   Labs (all labs ordered are listed, but only abnormal results are displayed) Labs Reviewed  BASIC METABOLIC PANEL - Abnormal; Notable for the following components:      Result Value   CO2 13 (*)    BUN 37 (*)    Creatinine, Ser 3.69 (*)    GFR, Estimated 14 (*)    Anion gap 16 (*)    All other components within normal limits  CBC - Abnormal; Notable for the following components:   RBC 5.40 (*)    All other components within normal limits   TROPONIN I (HIGH SENSITIVITY) - Abnormal; Notable for the following components:   Troponin I (High Sensitivity) 37 (*)    All other components within normal limits  TROPONIN I (HIGH SENSITIVITY) - Abnormal; Notable for the following components:   Troponin I (High Sensitivity) 38 (*)    All other components within normal limits    EKG EKG Interpretation Date/Time:  Monday December 16 2022 17:17:15 EDT Ventricular Rate:  77 PR Interval:  180 QRS Duration:  94 QT Interval:  406 QTC Calculation: 459 R Axis:   6  Text Interpretation: Normal sinus rhythm Possible Left atrial enlargement Cannot rule out Inferior infarct , age undetermined Abnormal ECG When compared with ECG of 03-Dec-2022 18:37, PREVIOUS ECG IS PRESENT Confirmed by Edwin Dada ([id number]) on 12/16/2022 6:59:57 PM  Radiology DG Chest 2 View  Result Date: 12/16/2022 CLINICAL DATA:  Chest pain EXAM: CHEST - 2 VIEW COMPARISON:  None Available. FINDINGS: The heart size and mediastinal contours are within normal limits. Both lungs are clear. The visualized skeletal structures are unremarkable. IMPRESSION: No active cardiopulmonary disease. Electronically Signed   By: Helyn Numbers M.D.   On: 12/16/2022 21:57    Procedures Procedures    Medications Ordered in ED Medications  nitroGLYCERIN (NITROSTAT) SL tablet 0.4 mg (has no administration in time range)    ED Course/ Medical Decision Making/ A&P                                 Medical Decision Making Risk Prescription drug management.   58 yo F with a chief complaint of chest pain.  The patient has a history of coronary artery disease has had multiple stents placed in the past.  Has had few recent admissions for the same.  Thought to be noncardiac by cardiology.  Serial troponins and then was discharged home.  Her pain is atypical for me.  Reproduced on exam.  Initial troponin appears to be at her baseline.  Will obtain a delta.  Delta unchanged.  Will discharge the  patient home.  Cardiology follow-up.  11:09 PM:  I have discussed the diagnosis/risks/treatment options with the patient.  Evaluation and diagnostic testing in the emergency department does not suggest an emergent condition requiring admission or immediate intervention beyond what has been  performed at this time.  They will follow up with Cards, PCP. We also discussed returning to the ED immediately if new or worsening sx occur. We discussed the sx which are most concerning (e.g., sudden worsening pain, fever, inability to tolerate by mouth) that necessitate immediate return. Medications administered to the patient during their visit and any new prescriptions provided to the patient are listed below.  Medications given during this visit Medications  nitroGLYCERIN (NITROSTAT) SL tablet 0.4 mg (has no administration in time range)     The patient appears reasonably screen and/or stabilized for discharge and I doubt any other medical condition or other [facility] requiring further screening, evaluation, or treatment in the ED at this time prior to discharge.          Final Clinical Impression(s) / ED Diagnoses Final diagnoses:  Nonspecific chest pain    Rx / DC Orders ED Discharge Orders     None         Melene Plan, DO 12/16/22 2309

## 2022-12-16 NOTE — ED Notes (Signed)
Patient transported to X-ray 

## 2022-12-16 NOTE — ED Triage Notes (Signed)
Patient BIB [facility] from home for CP starting at 0930. Patient reports CP under left breast with radiation to left arm. Patient has hx of same, EMS administered 324mg  aspirin and 1 nitroglycerin en route with no relief. Patient reports 8/10 chest pain at this time. BP 156/110, 99% RA, HR 85, CBG 103

## 2022-12-16 NOTE — ED Provider Triage Note (Signed)
Emergency Medicine Provider Triage Evaluation Note  Colleen Curtis , a 57 y.o. female  was evaluated in triage.  Pt complains of chest pain that began around 0930.  She states pain is under her left breast with radiation to left arm.  She has hx of same and was recently discharged from the hospital for similar pain.  EMS gave 324 ASA and 1 nitroglycerin en route without relief.  Patient reports she also has a "bad taste in her mouth" which is one of her auras for seizure.  She has been noncompliant with medication in the past and has not been taking her seizure medicine as prescribed.   Review of Systems  Positive: As above Negative: As above  Physical Exam  BP (!) 168/113 (BP Location: Left Arm)   Pulse 84   Temp 98.7 F (37.1 C) (Oral)   Resp 18   SpO2 100%  Gen:   Awake, no distress   Resp:  Normal effort  MSK:   Moves extremities without difficulty  Other:    Medical Decision Making  Medically screening exam initiated at 6:26 PM.  Appropriate orders placed.  Beryl Meager Keitt was informed that the remainder of the evaluation will be completed by another provider, this initial triage assessment does not replace that evaluation, and the importance of remaining in the ED until their evaluation is complete.     Melton Alar R, [place] 12/16/22 [phone]

## 2022-12-16 NOTE — Discharge Instructions (Signed)
Your markers for heart damage did not show any acute heart attack.  Please follow-up with your cardiologist in the office.  Please return for worsening symptoms.

## 2022-12-19 ENCOUNTER — Inpatient Hospital Stay (INDEPENDENT_AMBULATORY_CARE_PROVIDER_SITE_OTHER): Payer: Self-pay | Admitting: Primary Care

## 2022-12-25 ENCOUNTER — Encounter (HOSPITAL_COMMUNITY): Payer: Self-pay

## 2022-12-25 ENCOUNTER — Emergency Department (HOSPITAL_COMMUNITY)
Admission: EM | Admit: 2022-12-25 | Discharge: 2022-12-26 | Discharge status: Against Medical Advice | Payer: Self-pay | Attending: Emergency Medicine | Admitting: Emergency Medicine

## 2022-12-25 ENCOUNTER — Other Ambulatory Visit: Payer: Self-pay

## 2022-12-25 DIAGNOSIS — Z5321 Procedure and treatment not carried out due to patient leaving prior to being seen by health care provider: Secondary | ICD-10-CM | POA: Insufficient documentation

## 2022-12-25 DIAGNOSIS — R55 Syncope and collapse: Secondary | ICD-10-CM | POA: Insufficient documentation

## 2022-12-25 LAB — URINALYSIS, ROUTINE W REFLEX MICROSCOPIC
Bilirubin Urine: NEGATIVE
Glucose, UA: NEGATIVE mg/dL
Hgb urine dipstick: NEGATIVE
Ketones, ur: NEGATIVE mg/dL
Nitrite: NEGATIVE
Protein, ur: 100 mg/dL — AB
Specific Gravity, Urine: 1.019 (ref 1.005–1.030)
pH: 5 (ref 5.0–8.0)

## 2022-12-25 LAB — CBG MONITORING, ED: Glucose-Capillary: 96 mg/dL (ref 70–99)

## 2022-12-25 NOTE — ED Notes (Signed)
Attempted to collected labs and obtain IV to left hand with 22 G. Upon pentration of needle pt yelled out "please stop." Pt informed of importance of collecting urine d/t long wait time. Pt sts she understand but continues to refused. Pt to lobby via wheelchair. PA updated.

## 2022-12-25 NOTE — ED Triage Notes (Signed)
BIB EMS from home for weakness and syncopal episode that lasted for 30 seconds. Pt has been depressed due to a recent death in the family and has possibly had alcohol intake.

## 2022-12-25 NOTE — ED Provider Triage Note (Signed)
Emergency Medicine Provider Triage Evaluation Note  Lylith Kertis , a 58 y.o. female  was evaluated in triage.  Pt complains of having a syncopal episode earlier today.  States she was sitting on her couch and then passed out.  Denies feeling like she was going to pass out, or vision changes.  When she woke up, reports she threw up on herself.  Does report a history of seizures and reports she is on seizure medications.  Reports she has taken all of her medications this morning.  Reports a history of stents in her heart.  Review of Systems  Positive: As above Negative: As above  Physical Exam  BP (!) 128/105 (BP Location: Left Arm)   Pulse 81   Temp 98.3 F (36.8 C) (Oral)   Resp 16   Ht 5\' 3"  (1.6 m)   Wt 98.6 kg   SpO2 99%   BMI 38.51 kg/m  Gen:   Awake, no distress   Resp:  Normal effort  MSK:   Moves extremities without difficulty    Medical Decision Making  Medically screening exam initiated at 8:13 PM.  Appropriate orders placed.  Beryl Meager Contant was informed that the remainder of the evaluation will be completed by another provider, this initial triage assessment does not replace that evaluation, and the importance of remaining in the ED until their evaluation is complete.     Arabella Merles, PA-C 12/25/22 2015

## 2022-12-26 NOTE — ED Notes (Signed)
No answer for VS x 3
